# Patient Record
Sex: Female | Born: 1960 | Race: Black or African American | Hispanic: No | Marital: Married | Smoking: Current some day smoker
Health system: Southern US, Community
[De-identification: ages and names within clinical notes are randomized; demographics above are authoritative.]

## PROBLEM LIST (undated history)

## (undated) DIAGNOSIS — B182 Chronic viral hepatitis C: Secondary | ICD-10-CM

## (undated) DIAGNOSIS — K760 Fatty (change of) liver, not elsewhere classified: Secondary | ICD-10-CM

## (undated) DIAGNOSIS — T7840XA Allergy, unspecified, initial encounter: Secondary | ICD-10-CM

## (undated) DIAGNOSIS — M1990 Unspecified osteoarthritis, unspecified site: Secondary | ICD-10-CM

## (undated) DIAGNOSIS — K219 Gastro-esophageal reflux disease without esophagitis: Secondary | ICD-10-CM

## (undated) DIAGNOSIS — E785 Hyperlipidemia, unspecified: Secondary | ICD-10-CM

## (undated) DIAGNOSIS — I10 Essential (primary) hypertension: Secondary | ICD-10-CM

## (undated) DIAGNOSIS — R600 Localized edema: Secondary | ICD-10-CM

## (undated) DIAGNOSIS — M25519 Pain in unspecified shoulder: Secondary | ICD-10-CM

## (undated) DIAGNOSIS — E739 Lactose intolerance, unspecified: Secondary | ICD-10-CM

## (undated) DIAGNOSIS — M79606 Pain in leg, unspecified: Secondary | ICD-10-CM

## (undated) DIAGNOSIS — M549 Dorsalgia, unspecified: Secondary | ICD-10-CM

## (undated) HISTORY — DX: Gastro-esophageal reflux disease without esophagitis: K21.9

## (undated) HISTORY — DX: Fatty (change of) liver, not elsewhere classified: K76.0

## (undated) HISTORY — PX: TUBAL LIGATION: SHX77

## (undated) HISTORY — DX: Pain in leg, unspecified: M79.606

## (undated) HISTORY — DX: Hyperlipidemia, unspecified: E78.5

## (undated) HISTORY — DX: Chronic viral hepatitis C: B18.2

## (undated) HISTORY — PX: ABDOMINAL HYSTERECTOMY: SHX81

## (undated) HISTORY — DX: Pain in unspecified shoulder: M25.519

## (undated) HISTORY — DX: Allergy, unspecified, initial encounter: T78.40XA

## (undated) HISTORY — DX: Unspecified osteoarthritis, unspecified site: M19.90

## (undated) HISTORY — PX: ANKLE FRACTURE SURGERY: SHX122

## (undated) HISTORY — DX: Lactose intolerance, unspecified: E73.9

## (undated) HISTORY — DX: Localized edema: R60.0

## (undated) HISTORY — DX: Dorsalgia, unspecified: M54.9

## (undated) HISTORY — PX: LEG SURGERY: SHX1003

---

## 1999-01-30 ENCOUNTER — Other Ambulatory Visit: Admission: RE | Admit: 1999-01-30 | Discharge: 1999-01-30 | Payer: Self-pay | Admitting: Obstetrics and Gynecology

## 2000-06-20 ENCOUNTER — Other Ambulatory Visit: Admission: RE | Admit: 2000-06-20 | Discharge: 2000-06-20 | Payer: Self-pay | Admitting: *Deleted

## 2000-08-07 ENCOUNTER — Encounter (INDEPENDENT_AMBULATORY_CARE_PROVIDER_SITE_OTHER): Payer: Self-pay | Admitting: Specialist

## 2000-08-07 ENCOUNTER — Observation Stay (HOSPITAL_COMMUNITY): Admission: RE | Admit: 2000-08-07 | Discharge: 2000-08-08 | Payer: Self-pay | Admitting: *Deleted

## 2000-08-16 ENCOUNTER — Inpatient Hospital Stay (HOSPITAL_COMMUNITY): Admission: AD | Admit: 2000-08-16 | Discharge: 2000-08-16 | Payer: Self-pay | Admitting: Gynecology

## 2000-12-23 ENCOUNTER — Emergency Department (HOSPITAL_COMMUNITY): Admission: EM | Admit: 2000-12-23 | Discharge: 2000-12-23 | Payer: Self-pay | Admitting: Emergency Medicine

## 2002-05-19 ENCOUNTER — Encounter: Payer: Self-pay | Admitting: Emergency Medicine

## 2002-05-19 ENCOUNTER — Emergency Department (HOSPITAL_COMMUNITY): Admission: EM | Admit: 2002-05-19 | Discharge: 2002-05-19 | Payer: Self-pay | Admitting: Emergency Medicine

## 2003-06-05 HISTORY — PX: ABDOMINAL HYSTERECTOMY: SHX81

## 2003-11-15 ENCOUNTER — Emergency Department (HOSPITAL_COMMUNITY): Admission: EM | Admit: 2003-11-15 | Discharge: 2003-11-15 | Payer: Self-pay | Admitting: Emergency Medicine

## 2004-08-25 ENCOUNTER — Ambulatory Visit: Payer: Self-pay | Admitting: Internal Medicine

## 2005-09-18 ENCOUNTER — Emergency Department (HOSPITAL_COMMUNITY): Admission: EM | Admit: 2005-09-18 | Discharge: 2005-09-18 | Payer: Self-pay | Admitting: Emergency Medicine

## 2006-01-02 ENCOUNTER — Emergency Department (HOSPITAL_COMMUNITY): Admission: EM | Admit: 2006-01-02 | Discharge: 2006-01-02 | Payer: Self-pay | Admitting: Emergency Medicine

## 2006-01-16 ENCOUNTER — Ambulatory Visit (HOSPITAL_COMMUNITY): Admission: RE | Admit: 2006-01-16 | Discharge: 2006-01-16 | Payer: Self-pay | Admitting: Sports Medicine

## 2006-03-06 ENCOUNTER — Ambulatory Visit (HOSPITAL_BASED_OUTPATIENT_CLINIC_OR_DEPARTMENT_OTHER): Admission: RE | Admit: 2006-03-06 | Discharge: 2006-03-06 | Payer: Self-pay | Admitting: Orthopedic Surgery

## 2006-06-04 HISTORY — PX: KNEE ARTHROSCOPY: SHX127

## 2006-06-04 HISTORY — PX: OTHER SURGICAL HISTORY: SHX169

## 2007-04-15 ENCOUNTER — Emergency Department (HOSPITAL_COMMUNITY): Admission: EM | Admit: 2007-04-15 | Discharge: 2007-04-15 | Payer: Self-pay | Admitting: *Deleted

## 2008-02-06 ENCOUNTER — Emergency Department (HOSPITAL_COMMUNITY): Admission: EM | Admit: 2008-02-06 | Discharge: 2008-02-07 | Payer: Self-pay | Admitting: Emergency Medicine

## 2008-02-09 ENCOUNTER — Emergency Department (HOSPITAL_COMMUNITY): Admission: EM | Admit: 2008-02-09 | Discharge: 2008-02-09 | Payer: Self-pay | Admitting: Emergency Medicine

## 2008-08-27 ENCOUNTER — Emergency Department (HOSPITAL_COMMUNITY): Admission: EM | Admit: 2008-08-27 | Discharge: 2008-08-28 | Payer: Self-pay | Admitting: Emergency Medicine

## 2008-12-08 ENCOUNTER — Ambulatory Visit: Payer: Self-pay | Admitting: Infectious Diseases

## 2008-12-08 ENCOUNTER — Encounter: Payer: Self-pay | Admitting: Internal Medicine

## 2008-12-08 DIAGNOSIS — M549 Dorsalgia, unspecified: Secondary | ICD-10-CM | POA: Insufficient documentation

## 2008-12-08 DIAGNOSIS — E669 Obesity, unspecified: Secondary | ICD-10-CM | POA: Insufficient documentation

## 2008-12-08 DIAGNOSIS — G43709 Chronic migraine without aura, not intractable, without status migrainosus: Secondary | ICD-10-CM | POA: Insufficient documentation

## 2008-12-09 LAB — CONVERTED CEMR LAB
ALT: 45 units/L — ABNORMAL HIGH (ref 0–35)
Albumin: 3.7 g/dL (ref 3.5–5.2)
Alkaline Phosphatase: 74 units/L (ref 39–117)
Basophils Absolute: 0 10*3/uL (ref 0.0–0.1)
Basophils Relative: 0 % (ref 0–1)
Cholesterol: 190 mg/dL (ref 0–200)
Eosinophils Absolute: 0.1 10*3/uL (ref 0.0–0.7)
Free T4: 1.06 ng/dL (ref 0.80–1.80)
Glucose, Bld: 80 mg/dL (ref 70–99)
MCHC: 33.3 g/dL (ref 30.0–36.0)
MCV: 91 fL (ref 78.0–100.0)
Monocytes Relative: 8 % (ref 3–12)
Neutro Abs: 3.4 10*3/uL (ref 1.7–7.7)
Neutrophils Relative %: 45 % (ref 43–77)
Platelets: 255 10*3/uL (ref 150–400)
Potassium: 4.2 meq/L (ref 3.5–5.3)
RDW: 14.1 % (ref 11.5–15.5)
Sodium: 139 meq/L (ref 135–145)
TSH: 0.537 microintl units/mL (ref 0.350–4.500)
Total Bilirubin: 0.6 mg/dL (ref 0.3–1.2)
Total CHOL/HDL Ratio: 3
Total Protein: 7.1 g/dL (ref 6.0–8.3)

## 2009-04-09 ENCOUNTER — Encounter: Payer: Self-pay | Admitting: Family Medicine

## 2009-04-09 ENCOUNTER — Ambulatory Visit: Payer: Self-pay | Admitting: Infectious Diseases

## 2009-04-09 ENCOUNTER — Inpatient Hospital Stay (HOSPITAL_COMMUNITY): Admission: EM | Admit: 2009-04-09 | Discharge: 2009-04-11 | Payer: Self-pay | Admitting: Emergency Medicine

## 2009-04-11 ENCOUNTER — Encounter: Payer: Self-pay | Admitting: Internal Medicine

## 2009-04-11 ENCOUNTER — Encounter (INDEPENDENT_AMBULATORY_CARE_PROVIDER_SITE_OTHER): Payer: Self-pay | Admitting: Family Medicine

## 2009-04-21 ENCOUNTER — Ambulatory Visit: Payer: Self-pay | Admitting: Internal Medicine

## 2009-04-21 DIAGNOSIS — R079 Chest pain, unspecified: Secondary | ICD-10-CM | POA: Insufficient documentation

## 2009-04-21 DIAGNOSIS — R030 Elevated blood-pressure reading, without diagnosis of hypertension: Secondary | ICD-10-CM | POA: Insufficient documentation

## 2009-06-04 HISTORY — PX: ANKLE FRACTURE SURGERY: SHX122

## 2009-06-04 HISTORY — PX: LEG SURGERY: SHX1003

## 2009-11-02 ENCOUNTER — Ambulatory Visit: Payer: Self-pay | Admitting: Internal Medicine

## 2009-11-03 LAB — CONVERTED CEMR LAB
ALT: 49 units/L — ABNORMAL HIGH (ref 0–35)
AST: 39 units/L — ABNORMAL HIGH (ref 0–37)
Calcium: 9.6 mg/dL (ref 8.4–10.5)
Chloride: 104 meq/L (ref 96–112)
Creatinine, Ser: 0.81 mg/dL (ref 0.40–1.20)
Sodium: 138 meq/L (ref 135–145)
Total Protein: 6.7 g/dL (ref 6.0–8.3)

## 2010-02-03 ENCOUNTER — Emergency Department (HOSPITAL_COMMUNITY): Admission: EM | Admit: 2010-02-03 | Discharge: 2010-02-03 | Payer: Self-pay | Admitting: Emergency Medicine

## 2010-06-25 ENCOUNTER — Encounter: Payer: Self-pay | Admitting: Sports Medicine

## 2010-07-04 NOTE — Assessment & Plan Note (Signed)
Summary: headache/gg   Vital Signs:  Patient profile:   50 year old female Height:      59 inches (149.86 cm) Weight:      222.2 pounds (101.00 kg) BMI:     45.04 Temp:     97.4 degrees F (36.33 degrees C) oral Pulse rate:   70 / minute BP sitting:   123 / 82  (right arm)  Vitals Entered By: Stanton Kidney Ditzler RN (November 02, 2009 11:46 AM) Is Patient Diabetic? No Pain Assessment Patient in pain? yes     Location: back Intensity: 5 Type: aching Onset of pain  long time Nutritional Status BMI of > 30 = obese Nutritional Status Detail appetite good  Have you ever been in a relationship where you felt threatened, hurt or afraid?denies   Does patient need assistance? Functional Status Self care Ambulation Normal Comments Discuss back pain. Past 3-4 days h/a and feet and ankles swollen.   History of Present Illness: Sally Reid is a 50 yo lady with PMH as outlined in the EMR comes today for a f/u visit.   1. Back pain: IT is in the mid low back. It has remained stable and she used to take tramadol for pain.   2. HA: IT is on the forehead. SHe feels sleepy and feel loosy and is going on for 4 days. She had constant pain for the past 4 days. This feels like the same way as her migraine HA, but it is more intense. Her last migraine episode was at least 6 years ago. The pain is constant and throbbing. SHe also has photophobia. No nausea/vomiting/fever/chills. No visual aura. The HA is alleviated little bit by tylenol. It is aggravated by noise.   3. Leg swelling: She has ankle and feet swelling on both sides for always and she never mentioned this earlier. No calf pain.   Depression History:      The patient denies a depressed mood most of the day and a diminished interest in her usual daily activities.         Preventive Screening-Counseling & Management  Alcohol-Tobacco     Alcohol drinks/day: 0     Alcohol type: NONE     Smoking Status: current     Smoking Cessation Counseling:  yes     Packs/Day: 1 pp week     Year Started:   ABOUT THE AGE OF 19  Caffeine-Diet-Exercise     Does Patient Exercise: yes     Type of exercise: CARDIAC     Exercise (avg: min/session): 30-60     Times/week: 1-2  Current Medications (verified): 1)  Tramadol Hcl 50 Mg Tabs (Tramadol Hcl) .... Take1 Tablet Every 8 Hours As Needed For Pain.  Allergies: No Known Drug Allergies  Social History: Smoking Status:  current Packs/Day:  1 pp week  Review of Systems      See HPI  Physical Exam  Ears:  R ear normal and L ear normal.   Nose:  no sinus percussion tenderness.   Mouth:  pharynx pink and moist.   Lungs:  normal breath sounds, no crackles, and no wheezes.   Heart:  normal rate, regular rhythm, no murmur, no gallop, and no rub.   Extremities:  trace left pedal edema and trace right pedal edema.   Neurologic:  alert & oriented X3, cranial nerves II-XII intact, strength normal in all extremities, sensation intact to light touch, gait normal, finger-to-nose normal, and Romberg negative.   Cervical Nodes:  no anterior cervical adenopathy and no posterior cervical adenopathy.     Impression & Recommendations:  Problem # 1:  CHRONIC MIGRAINE W/O AURA W/O INTRACTABLE W/O SM (ICD-346.70) Her HA has some features of migraine, but her last attack was at least 6 years ago. Her HA is responding to tylenol. She is not taking ultram now. Plan is to continue ultram as needed and if it doesnot respond, will treat with more migraine specific drugs. But, she cannot afford them and an option would be cafergot.   Her updated medication list for this problem includes:    Tramadol Hcl 50 Mg Tabs (Tramadol hcl) .Marland Kitchen... Take1 tablet every 8 hours as needed for pain.  Problem # 2:  BACK PAIN (ICD-724.5) Stable. WIll cont same.  She c/o some leg swelling b/l, but there is none on exam and no calf tenderness. Last ECHO reviewed and LV function was normal.   Her updated medication list for this problem  includes:    Tramadol Hcl 50 Mg Tabs (Tramadol hcl) .Marland Kitchen... Take1 tablet every 8 hours as needed for pain.  Complete Medication List: 1)  Tramadol Hcl 50 Mg Tabs (Tramadol hcl) .... Take1 tablet every 8 hours as needed for pain.  Other Orders: T-Comprehensive Metabolic Panel ([id number])  Patient Instructions: 1)  Please schedule a follow-up appointment in 3 months. 2)  Limit your Sodium (Salt) to less than 2 grams a day(slightly less than 1/2 a teaspoon) to prevent fluid retention, swelling, or worsening of symptoms. 3)  It is important that you exercise regularly at least 20 minutes 5 times a week. If you develop chest pain, have severe difficulty breathing, or feel very tired , stop exercising immediately and seek medical attention. 4)  It is not healthy  for men to drink more than 2-3 drinks per day or for women to drink more than 1-2 drinks per day. Prescriptions: TRAMADOL HCL 50 MG TABS (TRAMADOL HCL) Take1 tablet every 8 hours as needed for pain.  #30 x 0   Entered and Authorized by:   Jason Coop MD   Signed by:   Jason Coop MD on 11/02/2009   Method used:   Electronically to        [facility] [phone]* (retail)       [address]       [place], [place]  [address]       Ph: [phone]       Fax: [phone]   RxID:   [id number]  Process Orders Check Orders Results:     [facility]: ABN not required for this insurance Tests Sent for requisitioning (November 02, 2009 6:00 PM):     11/02/2009: [facility] -- T-Comprehensive Metabolic Panel [phone] (signed)    Prevention & Chronic Care Immunizations   Influenza vaccine: Not documented   Influenza vaccine deferral: Deferred  (12/08/2008)   Influenza vaccine due: 02/02/2009    Tetanus booster: Not documented   Td booster deferral: Deferred  (12/08/2008)   Tetanus booster due: 12/09/2018    Pneumococcal vaccine: Not documented   Pneumococcal vaccine deferral:  Deferred  (12/08/2008)  Other Screening   Pap smear: Not documented   Pap smear action/deferral: Deferred  (11/02/2009)    Mammogram: Not documented   Mammogram action/deferral: Deferred  (11/02/2009)   Smoking status: current  (11/02/2009)   Smoking cessation counseling: yes  (11/02/2009)  Lipids   Total Cholesterol: 190  (12/08/2008)   Lipid panel action/deferral: Lipid Panel ordered  LDL: 95  (12/08/2008)   LDL Direct: Not documented   HDL: 63  (12/08/2008)   Triglycerides: 159  (12/08/2008)      Resource handout printed.  Process Orders Check Orders Results:     [facility]: ABN not required for this insurance Tests Sent for requisitioning (November 02, 2009 6:00 PM):     11/02/2009: [facility] -- T-Comprehensive Metabolic Panel [phone] (signed)

## 2010-08-09 ENCOUNTER — Encounter: Payer: Self-pay | Admitting: Internal Medicine

## 2010-09-02 ENCOUNTER — Emergency Department (HOSPITAL_COMMUNITY)
Admission: EM | Admit: 2010-09-02 | Discharge: 2010-09-02 | Disposition: A | Discharge status: Home / Self Care | Payer: Self-pay | Attending: Emergency Medicine | Admitting: Emergency Medicine

## 2010-09-02 ENCOUNTER — Emergency Department (HOSPITAL_COMMUNITY): Payer: Self-pay

## 2010-09-02 DIAGNOSIS — R059 Cough, unspecified: Secondary | ICD-10-CM | POA: Insufficient documentation

## 2010-09-02 DIAGNOSIS — R079 Chest pain, unspecified: Secondary | ICD-10-CM | POA: Insufficient documentation

## 2010-09-02 DIAGNOSIS — R05 Cough: Secondary | ICD-10-CM | POA: Insufficient documentation

## 2010-09-02 DIAGNOSIS — F17200 Nicotine dependence, unspecified, uncomplicated: Secondary | ICD-10-CM | POA: Insufficient documentation

## 2010-09-02 DIAGNOSIS — J40 Bronchitis, not specified as acute or chronic: Secondary | ICD-10-CM | POA: Insufficient documentation

## 2010-09-02 LAB — BASIC METABOLIC PANEL
BUN: 9 mg/dL (ref 6–23)
Calcium: 9.1 mg/dL (ref 8.4–10.5)
GFR calc non Af Amer: >60 mL/min (ref >60)
Glucose, Bld: 75 mg/dL (ref 70–99)
Sodium: 136 mEq/L (ref 135–145)

## 2010-09-02 LAB — CBC
HCT: 45.2 % (ref 36.0–46.0)
MCHC: 33.4 g/dL (ref 30.0–36.0)
MCV: 89.5 fL (ref 78.0–100.0)
Platelets: 192 10*3/uL (ref 150–400)
RDW: 13.2 % (ref 11.5–15.5)

## 2010-09-06 LAB — BASIC METABOLIC PANEL
BUN: 12 mg/dL (ref 6–23)
BUN: 5 mg/dL — ABNORMAL LOW (ref 6–23)
Calcium: 9 mg/dL (ref 8.4–10.5)
Creatinine, Ser: 0.69 mg/dL (ref 0.4–1.2)
GFR calc non Af Amer: 53 mL/min — ABNORMAL LOW (ref >60)
GFR calc non Af Amer: >60 mL/min (ref >60)
GFR calc non Af Amer: >60 mL/min (ref >60)
Glucose, Bld: 93 mg/dL (ref 70–99)
Glucose, Bld: 94 mg/dL (ref 70–99)
Potassium: 3.7 mEq/L (ref 3.5–5.1)
Sodium: 136 mEq/L (ref 135–145)

## 2010-09-06 LAB — DIFFERENTIAL
Basophils Relative: 1 % (ref 0–1)
Eosinophils Absolute: 0.1 10*3/uL (ref 0.0–0.7)
Lymphocytes Relative: 41 % (ref 12–46)
Lymphocytes Relative: 60 % — ABNORMAL HIGH (ref 12–46)
Lymphs Abs: 3.1 10*3/uL (ref 0.7–4.0)
Lymphs Abs: 3.6 10*3/uL (ref 0.7–4.0)
Monocytes Absolute: 0.4 10*3/uL (ref 0.1–1.0)
Monocytes Relative: 9 % (ref 3–12)
Neutro Abs: 1.5 10*3/uL — ABNORMAL LOW (ref 1.7–7.7)
Neutro Abs: 4.3 10*3/uL (ref 1.7–7.7)
Neutrophils Relative %: 29 % — ABNORMAL LOW (ref 43–77)
Neutrophils Relative %: 49 % (ref 43–77)

## 2010-09-06 LAB — CBC
HCT: 39 % (ref 36.0–46.0)
Hemoglobin: 13.2 g/dL (ref 12.0–15.0)
MCHC: 33.9 g/dL (ref 30.0–36.0)
MCHC: 34.4 g/dL (ref 30.0–36.0)
MCV: 94.3 fL (ref 78.0–100.0)
Platelets: 189 10*3/uL (ref 150–400)
Platelets: 229 10*3/uL (ref 150–400)
RDW: 13.8 % (ref 11.5–15.5)
RDW: 13.9 % (ref 11.5–15.5)
WBC: 8.8 10*3/uL (ref 4.0–10.5)

## 2010-09-06 LAB — CARDIAC PANEL(CRET KIN+CKTOT+MB+TROPI)
CK, MB: 0.9 ng/mL (ref 0.3–4.0)
CK, MB: 1.1 ng/mL (ref 0.3–4.0)
Relative Index: INVALID (ref 0.0–2.5)
Total CK: 69 U/L (ref 7–177)
Total CK: 82 U/L (ref 7–177)
Troponin I: 0.01 ng/mL (ref 0.00–0.06)
Troponin I: 0.01 ng/mL (ref 0.00–0.06)

## 2010-09-06 LAB — URINALYSIS, ROUTINE W REFLEX MICROSCOPIC
Glucose, UA: NEGATIVE mg/dL
Ketones, ur: NEGATIVE mg/dL
Protein, ur: NEGATIVE mg/dL

## 2010-09-06 LAB — POCT CARDIAC MARKERS
CKMB, poc: 1.2 ng/mL (ref 1.0–8.0)
Myoglobin, poc: 84.6 ng/mL (ref 12–200)
Troponin i, poc: <0.05 ng/mL (ref 0.00–0.09)
Troponin i, poc: <0.05 ng/mL (ref 0.00–0.09)

## 2010-09-06 LAB — MAGNESIUM: Magnesium: 1.9 mg/dL (ref 1.5–2.5)

## 2010-09-06 LAB — HEMOGLOBIN A1C: Mean Plasma Glucose: 103 mg/dL

## 2010-09-06 LAB — TSH: TSH: 0.934 u[IU]/mL (ref 0.350–4.500)

## 2010-09-06 LAB — LIPID PANEL: Triglycerides: 90 mg/dL (ref <150)

## 2010-10-20 NOTE — H&P (Signed)
[facility]  Patient:    Sally Reid, Sally Reid                     MRN: [id number] Adm. Date:  08/02/00 Attending:  Katy Fitch, M.D.                         History and Physical  CHIEF COMPLAINT:              Menorrhagia and dysmenorrhea.  HISTORY OF PRESENT ILLNESS:   The patient is a 50 year old, G3, P2, A1, who has had a long-standing history of painful periods along with heavy vaginal bleeding when she has her cycles. The patient is known to have cycles between 28 and 50 days and had normal studies of thyroid and prolactin performed. The patient also has heavy bleeding when she does have her periods and also increased pelvic pain with her periods. The patient had an ultrasound in the office showing a 9 x 8 x 5 cm uterus with a large fibroid in the left-hand side extending to the adnexa. The patient denies any other complaints.  PAST MEDICAL HISTORY:         None.  PAST SURGICAL HISTORY:        Two prior C-sections and a D&C for a missed abortion.  PAST OBSTETRICAL HISTORY:     Two C-sections, 1981 and 1983, and a D&C for a missed abortion.  PAST GYNECOLOGICAL HISTORY:   Menarche age 9. No history of abnormal Pap smears or STDs.  MEDICATIONS:                  None.  ALLERGIES:                    None.  SOCIAL HISTORY:               The patient works in a nursing home. She smokes a pack a day. She denies any alcohol or drugs.  FAMILY HISTORY:               Without any epithelial cancers.  PHYSICAL EXAMINATION:  VITAL SIGNS:                  Blood pressure 106/74. Weight 210.  HEENT:                        Clear.  NECK:                         Without any thyromegaly or JVD.  LUNGS:                        Clear to auscultation bilaterally.  HEART:                        Regular rate and rhythm without any murmurs, rubs, or gallops.  ABDOMEN:                      Soft, nontender, no palpable masses.  EXTREMITIES:                   Without any edema, clubbing, cyanosis, or _______.  PELVIC:                       Normal external genitalia. Cervix  without any gross lesions. Uterus 8- to 10-week size. Good uterine descensus when tenaculum placed on cervix. Adnexa without any masses, nontender.  ASSESSMENT/PLAN:              A 50 year old, G3, P2, A1, with menorrhagia and dysmenorrhea who is desiring definitive therapy. Will schedule a total vaginal hysterectomy with possible laparotomy. The patient was explained risks and benefits of the procedure including bleeding, transfusion, infection, scar tissue, wound breakdown, damage to internal organs, fistula formation, continued pain, loss of reproductive capabilities, anesthetic complications, blood clots, pulmonary embolism, or possibly death. The patient was also extensively counseled that she may need to have a conversion to a laparotomy due to the fibroid and difficulty of getting into the cul-de-sac due to her previous cesarean sections. However, the patient did have good uterine descensus on exam and was explained that we will initially attempt a vaginal hysterectomy with the possibility of needing conversion. The patient states she understands this risk, along with her husband, and will desire to proceed. The patient will have anesthesia workup on March 5 and have her surgery on March 6. DD:  08/02/00 TD:  08/03/00 Job: [id number]

## 2010-10-20 NOTE — Op Note (Signed)
[facility]  Patient:    Sally Reid, Sally Reid                   MRN: [id number] Proc. Date: 08/07/00 Adm. Date:  [id number] Attending:  Wetzel Bjornstad                           Operative Report  PREOPERATIVE DIAGNOSES:       1. Ten week fibroid uterus.                               2. Menorrhagia.                               3. Dysmenorrhea.  POSTOPERATIVE DIAGNOSES:      1. Ten week fibroid uterus.                               2. Menorrhagia.                               3. Dysmenorrhea.  OPERATION:                    Total vaginal hysterectomy.  SURGEON:                      Katy Fitch, M.D.  ASSISTANT:                    Rande Brunt. Eda Paschal, M.D.  ANESTHESIA:                   General.  ESTIMATED BLOOD LOSS:         100 cc.  URINE OUTPUT:                 100 cc clear.  INTRAVENOUS FLUIDS:           2000 cc Crystalloid.  SPECIMENS:                    Uterus.  INTRAOPERATIVE FINDINGS:      Ten to twelve weeks sized fibroid uterus. Normal appearing tubes and ovaries.  COMPLICATIONS:                None.  DESCRIPTION OF PROCEDURE:     After consents and procedure explained, the patient was taken to the operating room and placed in stirrups. The patient was given general anesthesia and then prepped and draped in a normal sterile fashion. A weighted speculum was placed on the vagina and then a Foley catheter was inserted in the bladder and initial 50 cc of clear urine was noted. The anterior lip of the cervix was then grasped with a single tooth tenaculum and Pitressin solution, approximately 20 cc was circumferentially injected into the cervicovaginal mucosa. The mucosa was then incised with a scalpel down to the pubovesicocervical fascia and this was then bluntly dissected off using a sponge on a finger. The posterior peritoneum was then identified and the posterior cul-de-sac was entered sharply using curved Mayo scissors. A  long weighted speculum was then placed into the cul-de-sac. The anterior mucosa was then dissected off without difficulty and a Heaney clamp was then placed  on the uterosacral ligament on the left, clamped, cut and ligated using #0 Vicryl. This suture was held and this was done similarly on the right. Then another bite using Heaney was performed and suture ligated with #0 chromic. The patient was noted to have a long cervix and two more bites were needed no both sides to get to the uterine vessels. Once at the uterine vessels being clamped with Heaney and suture ligated with #0 Vicryl. At this point the cervix was then cored using a scalpel for purposes of exposure, the uterus was not able to be delivered secondary to large intramural fibroids which were both fundal and to the left side. At this point a myomectomy was performed in the midline and two 3 x 3 cm fibroids were delivered. A larger 5 x 5 cm fibroid was then delivered on the left hand side without difficulty. At this point the broad ligaments were able to be clamped using a Heaney clamp and suture ligated with #0 Vicryl. The utero-ovarian ligaments were then cross clamped and the specimen was then removed. Both of these were doubly ligated with #0 Vicryl without difficulty. The peritoneum was identified and noted to have some mild bleeding. The posterior vaginal cuff was then whipstitched with a #0 chromic in a running interlocked stitch. A sponge was then placed into the posterior cul-de-sac and no bleeding was noted from the pedicles. Irrigation was then placed and no bleeding was noted at the anterior vaginal cuff. At this point the sponge was removed and the angled sutures were placed at the vaginal cuff using #0 Vicryl and then four interrupted vertical mattresses sutures of #0 Vicryl were placed in the cuff and hemostasis was noted. A vaginal pack was placed and the patient was taken to the recovery room in stable  condition. DD:  08/07/00 TD:  08/07/00 Job: [id number]

## 2010-10-20 NOTE — Discharge Summary (Signed)
[facility]  Patient:    Sally Reid, Sally Reid                   MRN: [id number] Adm. Date:  [id number] Disc. Date: [id number] Attending:  Merrily Pew Dictator:   Antony Contras, N.P.                           Discharge Summary  DISCHARGE DIAGNOSES:          A 10 weeks sized fibroid uterus, menorrhagia, dysmenorrhea.  PROCEDURE:                    Total vaginal hysterectomy.  HISTORY OF PRESENT ILLNESS:   Patient is a 50 year old gravida 3, para 2, AB1 who has had a long-standing history of painful periods with heavy vaginal bleeding with her cycles.  She is known to have cycles between 28-50 days and has normal ______ thyroid and prolactin.  She had also had an ultrasound which shows a 9 x 8 x 5 cm uterus with a large fibroid in the left-hand side extending to the adnexa.  She is desiring definitive surgery and was scheduled for total vaginal hysterectomy with possible laparotomy.  HOSPITAL COURSE:              Patient was admitted on August 07, 2000.  Total vaginal hysterectomy was performed by Dr. Penni Homans assisted by Dr. Eda Paschal under general anesthesia.  The findings were a 10-12 weeks sized fibroid uterus, normal appearing tubes and ovaries.  Postoperatively patient remained afebrile.  Had no difficulty voiding and was able to be discharged in satisfactory condition on her first postoperative day.  LABORATORIES:                 CBC:  Hematocrit 31.7, hemoglobin 11, WBC 11.5, platelets 278,000.  DISPOSITION:                  Follow-up in the office.  Appropriate postoperative pain management. DD:  08/26/00 TD:  08/27/00 Job: [id number]

## 2010-10-20 NOTE — Op Note (Signed)
Sally Reid, Sally Reid     ACCOUNT NO.:  [id number]   MEDICAL RECORD NO.:  [id number]          PATIENT TYPE:  AMB   LOCATION:  DSC                          FACILITY:  [facility]   PHYSICIAN:  Loreta Ave, M.D. DATE OF BIRTH:  [birth year]   DATE OF PROCEDURE:  03/06/2006  DATE OF DISCHARGE:                                 OPERATIVE REPORT   PREOPERATIVE DIAGNOSES:  Lateral meniscal tear, left knee, with a small cyst  behind posterior cruciate ligament.   POSTOPERATIVE DIAGNOSES:  Lateral meniscal tear, left knee, with a small  cyst behind posterior cruciate ligament.   PROCEDURES:  Left knee examination under anesthesia, arthroscopy, extensive  partial lateral meniscectomy, excision of cyst behind posterior cruciate  ligament.   SURGEON:  Loreta Ave, M.D.   ASSISTANT:  Genene Churn. Denton Meek.   ANESTHESIA:  General.   BLOOD LOSS:  Minimal.   TOURNIQUET:  Not employed.   SPECIMENS:  None.   CULTURES:  None.   COMPLICATIONS:  None.   DRESSING:  Sterile compressive.   DESCRIPTION OF PROCEDURES:  The patient was brought to the operating room,  and after adequate anesthesia had been obtained, the left knee was examined.  Full motion and good stability.  Somewhat limited by adiposity.  Tourniquet  leg holder applied.  Leg prepped and draped in the usual sterile fashion.  Three portals were created, 1 superolateral, 1 each, medial and lateral  parapatellar.  Inflow cannula was introduced.  The knee was distended.  The  arthroscope was introduced.  The knee was inspected.  Good patellofemoral  tracking and cartilage.  Cruciate ligaments intact.  A small area of  synovitis and a small cyst behind the posterior cruciate ligament seen  posteromedially, excised.  Medial meniscus, medial compartment intact.  Lateral compartment looked good, but there was marked complex tearing of the  lateral meniscus and numerous displaced fragments, some of which displaced  up into the  front of the compartment, consistent with an old bucket-handle  tear of the torn half, irreparable.  The entire posterior half of the  meniscus removed, tapered to remaining meniscus.  At completion, the entire  knee  examined; no other findings appreciated.  Instruments and fluid removed.  Portals and knee injected with Marcaine.  Portals were closed with 4-0  nylon.  Sterile compressive dressing applied.  Anesthesia reversed.  Brought  to recovery room.  Tolerated surgery well.  No complications.      Loreta Ave, M.D.  Electronically Signed     DFM/MEDQ  D:  03/06/2006  T:  03/06/2006  Job:  [id number]

## 2010-12-10 ENCOUNTER — Emergency Department (HOSPITAL_COMMUNITY): Payer: Self-pay

## 2010-12-10 ENCOUNTER — Emergency Department (HOSPITAL_COMMUNITY)
Admission: EM | Admit: 2010-12-10 | Discharge: 2010-12-10 | Disposition: A | Discharge status: Home / Self Care | Payer: Self-pay | Attending: Emergency Medicine | Admitting: Emergency Medicine

## 2010-12-10 DIAGNOSIS — R071 Chest pain on breathing: Secondary | ICD-10-CM | POA: Insufficient documentation

## 2010-12-10 DIAGNOSIS — R7989 Other specified abnormal findings of blood chemistry: Secondary | ICD-10-CM | POA: Insufficient documentation

## 2010-12-10 DIAGNOSIS — R0609 Other forms of dyspnea: Secondary | ICD-10-CM | POA: Insufficient documentation

## 2010-12-10 DIAGNOSIS — R1013 Epigastric pain: Secondary | ICD-10-CM | POA: Insufficient documentation

## 2010-12-10 DIAGNOSIS — R0989 Other specified symptoms and signs involving the circulatory and respiratory systems: Secondary | ICD-10-CM | POA: Insufficient documentation

## 2010-12-10 LAB — DIFFERENTIAL
Basophils Absolute: 0 10*3/uL (ref 0.0–0.1)
Eosinophils Relative: 1 % (ref 0–5)
Lymphocytes Relative: 54 % — ABNORMAL HIGH (ref 12–46)
Lymphs Abs: 4.2 10*3/uL — ABNORMAL HIGH (ref 0.7–4.0)
Neutro Abs: 2.8 10*3/uL (ref 1.7–7.7)
Neutrophils Relative %: 35 % — ABNORMAL LOW (ref 43–77)

## 2010-12-10 LAB — COMPREHENSIVE METABOLIC PANEL
ALT: 57 U/L — ABNORMAL HIGH (ref 0–35)
Albumin: 2.9 g/dL — ABNORMAL LOW (ref 3.5–5.2)
Alkaline Phosphatase: 88 U/L (ref 39–117)
Chloride: 104 mEq/L (ref 96–112)
Glucose, Bld: 88 mg/dL (ref 70–99)
Potassium: 3.8 mEq/L (ref 3.5–5.1)
Sodium: 136 mEq/L (ref 135–145)
Total Bilirubin: 0.4 mg/dL (ref 0.3–1.2)
Total Protein: 6.9 g/dL (ref 6.0–8.3)

## 2010-12-10 LAB — CBC
HCT: 39.3 % (ref 36.0–46.0)
RBC: 4.54 MIL/uL (ref 3.87–5.11)
RDW: 13.1 % (ref 11.5–15.5)
WBC: 7.8 10*3/uL (ref 4.0–10.5)

## 2010-12-10 LAB — TROPONIN I: Troponin I: <0.3 ng/mL (ref <0.30)

## 2011-03-07 LAB — URINALYSIS, ROUTINE W REFLEX MICROSCOPIC
Nitrite: NEGATIVE
Nitrite: NEGATIVE
Specific Gravity, Urine: 1.022
Specific Gravity, Urine: 1.022
Urobilinogen, UA: 1
pH: 5.5

## 2011-03-13 LAB — DIFFERENTIAL
Basophils Relative: 0
Monocytes Absolute: 0.4
Monocytes Relative: 7
Neutro Abs: 3.1

## 2011-03-13 LAB — RAPID URINE DRUG SCREEN, HOSP PERFORMED
Amphetamines: NOT DETECTED
Barbiturates: NOT DETECTED

## 2011-03-13 LAB — CBC
Hemoglobin: 14.1
MCHC: 33.7
RBC: 4.45

## 2011-03-13 LAB — URINALYSIS, ROUTINE W REFLEX MICROSCOPIC
Hgb urine dipstick: NEGATIVE
Protein, ur: NEGATIVE
Urobilinogen, UA: 1

## 2011-03-13 LAB — I-STAT 8, (EC8 V) (CONVERTED LAB)
Acid-base deficit: 1
Glucose, Bld: 89
HCT: 43
Hemoglobin: 14.6
Potassium: 4
Sodium: 139
TCO2: 24

## 2011-03-13 LAB — POCT CARDIAC MARKERS
CKMB, poc: <1 — ABNORMAL LOW
Troponin i, poc: <0.05

## 2011-08-01 ENCOUNTER — Other Ambulatory Visit: Payer: Self-pay | Admitting: Obstetrics and Gynecology

## 2011-08-01 DIAGNOSIS — Z1231 Encounter for screening mammogram for malignant neoplasm of breast: Secondary | ICD-10-CM

## 2011-08-07 ENCOUNTER — Other Ambulatory Visit: Payer: Self-pay | Admitting: Obstetrics and Gynecology

## 2011-08-07 ENCOUNTER — Ambulatory Visit (INDEPENDENT_AMBULATORY_CARE_PROVIDER_SITE_OTHER): Payer: Self-pay | Admitting: *Deleted

## 2011-08-07 ENCOUNTER — Ambulatory Visit (HOSPITAL_COMMUNITY): Payer: Self-pay | Attending: Obstetrics and Gynecology

## 2011-08-07 VITALS — BP 128/85 | HR 93 | Temp 97.6°F | Ht 59.0 in | Wt 223.6 lb

## 2011-08-07 DIAGNOSIS — N644 Mastodynia: Secondary | ICD-10-CM

## 2011-08-07 DIAGNOSIS — Z01419 Encounter for gynecological examination (general) (routine) without abnormal findings: Secondary | ICD-10-CM

## 2011-08-07 DIAGNOSIS — N898 Other specified noninflammatory disorders of vagina: Secondary | ICD-10-CM

## 2011-08-07 DIAGNOSIS — N63 Unspecified lump in unspecified breast: Secondary | ICD-10-CM

## 2011-08-07 DIAGNOSIS — N6310 Unspecified lump in the right breast, unspecified quadrant: Secondary | ICD-10-CM

## 2011-08-07 DIAGNOSIS — N6312 Unspecified lump in the right breast, upper inner quadrant: Secondary | ICD-10-CM

## 2011-08-07 NOTE — Patient Instructions (Addendum)
Taught patient how to perform BSE and gave educational materials to take home. Told patient that if today's Pap smear is normal she will not need any further Pap smears since has had a hysterectomy for benign reasons. Patient is scheduled for a diagnostic mammogram next Wednesday, August 15, 2011 at 0930. Patient aware of appointment and will be there. Let patient know will follow up with her within the next couple weeks with results. Patient verbalized understanding.

## 2011-08-07 NOTE — Progress Notes (Signed)
Complaints of left outer breast soreness rated a 8 out of 10 over the last 1 to 1 1/2 months.  Pap Smear:    Pap smear completed today. Per patient last Pap smear was in 2005. Per patient no history of abnormal Pap smears. Patient has a history of a hysterectomy for benign reasons. If  today's Pap smear is normal patient will not need any further Pap smears per BCCCP and ACOG guidelines. No Pap smear results in EPIC.  Physical exam: Breasts Breasts symmetrical. No skin abnormalities bilateral breasts. No nipple retraction bilateral breasts. No nipple discharge bilateral breasts. No lymphadenopathy. No lumps palpated left left. Palpated a small lump within the right breast at around 2 o'clock about 5 cm from the nipple. Complaints of tenderness on palpation of the left outer breast and lump in right breast.        Pelvic/Bimanual   Ext Genitalia No lesions, no swelling and no discharge observed on external genitalia.         Vagina Vagina pink and normal texture. No lesions observed in vagina. Thick white discharge observed in vagina. Wet prep completed today.         Cervix Cervix is absent due a history of a partial hysterectomy.    Uterus Uterus is absent due a history of a partial hysterectomy.    Adnexae Bilateral ovaries present and palpable. No tenderness on palpation.          Rectovaginal No rectal exam completed today since patient had no rectal complaints. No skin abnormalities observed on exam.

## 2011-08-07 NOTE — Progress Notes (Signed)
Pain, soreness, and itching in left breast about a month.

## 2011-08-08 MED ORDER — METRONIDAZOLE 500 MG PO TABS: 500.0000 mg | ORAL_TABLET | Freq: Two times a day (BID) | ORAL | Status: AC

## 2011-08-08 NOTE — Progress Notes (Signed)
Addended by: Catalina Antigua on: 08/08/2011 07:59 AM   Modules accepted: Orders

## 2011-08-09 ENCOUNTER — Telehealth: Payer: Self-pay | Admitting: *Deleted

## 2011-08-09 NOTE — Telephone Encounter (Signed)
Attempted to call patient to give wet prep results. Wet prep showed BV and wanted to let her know a prescription has been sent to her pharmacy. No one answered phone. Left voicemail for patient to call me back.  Patient called me back a few minutes later. Let her know the wet prep completed showed BV. Told her a prescription for Flagyl has been sent to her pharmacy. Told patient to avoid alcohol while taking medication. Let her know have not received Pap smear result and if normal will send her a letter or if abnormal will call her. [facility] for her diagnostic mammogram. Patient verbalized understanding.

## 2011-08-15 ENCOUNTER — Other Ambulatory Visit: Payer: Self-pay

## 2011-08-22 ENCOUNTER — Other Ambulatory Visit: Payer: Self-pay

## 2011-08-31 ENCOUNTER — Encounter: Payer: Self-pay | Admitting: Obstetrics and Gynecology

## 2011-08-31 ENCOUNTER — Ambulatory Visit
Admission: RE | Admit: 2011-08-31 | Discharge: 2011-08-31 | Disposition: A | Discharge status: Home / Self Care | Payer: No Typology Code available for payment source | Source: Ambulatory Visit | Attending: Obstetrics and Gynecology | Admitting: Obstetrics and Gynecology

## 2011-08-31 DIAGNOSIS — N644 Mastodynia: Secondary | ICD-10-CM

## 2011-08-31 DIAGNOSIS — N6312 Unspecified lump in the right breast, upper inner quadrant: Secondary | ICD-10-CM

## 2011-09-07 ENCOUNTER — Telehealth: Payer: Self-pay | Admitting: *Deleted

## 2011-09-07 NOTE — Telephone Encounter (Signed)
Telephoned patient at home # and left message to return call.  

## 2011-09-11 ENCOUNTER — Telehealth: Payer: Self-pay | Admitting: *Deleted

## 2011-09-11 NOTE — Telephone Encounter (Signed)
Telephoned pt and left message to return call to Fonnie Mu [phone].

## 2011-09-18 ENCOUNTER — Telehealth: Payer: Self-pay

## 2011-09-18 NOTE — Telephone Encounter (Signed)
Called pt and informed her of a reminder to schedule her annual screening mammogram for March 2014.  Pt stated understanding and had no further questions.

## 2012-01-02 ENCOUNTER — Emergency Department (HOSPITAL_COMMUNITY)
Admission: EM | Admit: 2012-01-02 | Discharge: 2012-01-02 | Disposition: A | Discharge status: Home / Self Care | Payer: Self-pay | Attending: Emergency Medicine | Admitting: Emergency Medicine

## 2012-01-02 ENCOUNTER — Encounter (HOSPITAL_COMMUNITY): Payer: Self-pay

## 2012-01-02 DIAGNOSIS — F17200 Nicotine dependence, unspecified, uncomplicated: Secondary | ICD-10-CM | POA: Insufficient documentation

## 2012-01-02 DIAGNOSIS — H04309 Unspecified dacryocystitis of unspecified lacrimal passage: Secondary | ICD-10-CM | POA: Insufficient documentation

## 2012-01-02 DIAGNOSIS — M129 Arthropathy, unspecified: Secondary | ICD-10-CM | POA: Insufficient documentation

## 2012-01-02 DIAGNOSIS — H04329 Acute dacryocystitis of unspecified lacrimal passage: Secondary | ICD-10-CM

## 2012-01-02 MED ORDER — AMOXICILLIN-POT CLAVULANATE 500-125 MG PO TABS: 1.0000 | ORAL_TABLET | Freq: Three times a day (TID) | ORAL | Status: AC

## 2012-01-02 MED ORDER — AMOXICILLIN-POT CLAVULANATE 250-125 MG PO TABS: 1.0000 | ORAL_TABLET | Freq: Three times a day (TID) | ORAL | Status: DC

## 2012-01-02 NOTE — ED Notes (Signed)
Patient has pain and swelling to left eye. Patient reports that she had a sty 2 weeks ago and it  went away and now has had swelling and blurred vision that has gotten progressively worse in the past 5 days.

## 2012-01-06 NOTE — ED Provider Notes (Signed)
History     CSN: [id number]  Arrival date & time 01/02/12  1217   First MD Initiated Contact with Patient 01/02/12 1351      Chief Complaint  Patient presents with  . Eye Pain  . Blurred Vision    (Consider location/radiation/quality/duration/timing/severity/associated sxs/prior treatment) Patient is a 51 y.o. female presenting with eye pain. The history is provided by the patient.  Eye Pain  51 y/o female iNAD c/o left eye discomfort and eye- lid swelling worsening over the last 5 days. Pt had a stye x2 weeks ago, now resolved. Pt reports a crusting and purulent discharge which blurs her vision. She denies eye pain proper, but states that her eye lid, especially on the medial side, is swollen and tender.  Denies pain with eye movement or fever.   Past Medical History  Diagnosis Date  . Arthritis     Past Surgical History  Procedure Date  . Abdominal hysterectomy     2005  . Arthroscopy of left knee 2008    for cartilage surgery  . Cesarean section     2 previous    Family History  Problem Relation Age of Onset  . Diabetes Mother   . Hypertension Mother     History  Substance Use Topics  . Smoking status: Passive Smoker -- 0.2 packs/day  . Smokeless tobacco: Never Used  . Alcohol Use: Yes     occassionally    OB History    Grav Para Term Preterm Abortions TAB SAB Ect Mult Living   3 2 2  1 1    2       Review of Systems  Eyes: Negative for pain.       Eye lid pain and swelling.     Allergies  Review of patient's allergies indicates no known allergies.  Home Medications   Current Outpatient Rx  Name Route Sig Dispense Refill  . ACETAMINOPHEN 500 MG PO TABS Oral Take 1,000 mg by mouth every 6 (six) hours as needed. For pain.    . ADULT MULTIVITAMIN W/MINERALS CH Oral Take 1 tablet by mouth daily.    . AMOXICILLIN-POT CLAVULANATE 500-125 MG PO TABS Oral Take 1 tablet (500 mg total) by mouth every 8 (eight) hours. 21 tablet 0    BP 128/85  Pulse 77   Temp 98.1 F (36.7 C) (Oral)  Resp 20  SpO2 98%  Physical Exam  Nursing note and vitals reviewed. Constitutional: She is oriented to person, place, and time. She appears well-developed and well-nourished. No distress.  HENT:  Head: Normocephalic.  Eyes: Conjunctivae and EOM are normal. Pupils are equal, round, and reactive to light.       Swelling and warmth to medial canthus of left eye, tense and tender to palpation. Mild conjunctival injection, with purulent drainage. No pain with EOM  Cardiovascular: Normal rate.   Pulmonary/Chest: Effort normal.  Musculoskeletal: Normal range of motion.  Neurological: She is alert and oriented to person, place, and time.  Psychiatric: She has a normal mood and affect.    ED Course  Procedures (including critical care time)  Labs Reviewed - No data to display No results found.   1. Dacryocystitis, acute       MDM  Swelling warmth and tenderness to left medial canthus consistent with dacryocystis.  No evidence of orbital cellulitis. I will treat with Augmentin 500 mg BID and encourage warm compresses. Strict return precautions given.  Pt verbalized understanding and agrees with care plan. Outpatient  follow-up and return precautions given.           Joni Reining Leida Luton, PA-C 01/06/12 0100

## 2012-01-08 NOTE — ED Provider Notes (Signed)
Medical screening examination/treatment/procedure(s) were performed by non-physician practitioner and as supervising physician I was immediately available for consultation/collaboration.  Flint Melter, MD 01/08/12 [phone]

## 2012-02-13 ENCOUNTER — Emergency Department (HOSPITAL_COMMUNITY)
Admission: EM | Admit: 2012-02-13 | Discharge: 2012-02-13 | Disposition: A | Discharge status: Home / Self Care | Payer: Self-pay | Attending: Emergency Medicine | Admitting: Emergency Medicine

## 2012-02-13 ENCOUNTER — Encounter (HOSPITAL_COMMUNITY): Payer: Self-pay

## 2012-02-13 DIAGNOSIS — Z87891 Personal history of nicotine dependence: Secondary | ICD-10-CM | POA: Insufficient documentation

## 2012-02-13 DIAGNOSIS — H60399 Other infective otitis externa, unspecified ear: Secondary | ICD-10-CM | POA: Insufficient documentation

## 2012-02-13 DIAGNOSIS — M129 Arthropathy, unspecified: Secondary | ICD-10-CM | POA: Insufficient documentation

## 2012-02-13 DIAGNOSIS — H6090 Unspecified otitis externa, unspecified ear: Secondary | ICD-10-CM

## 2012-02-13 MED ORDER — CIPROFLOXACIN-DEXAMETHASONE 0.3-0.1 % OT SUSP
4.0000 [drp] | Freq: Once | OTIC | Status: AC
Start: 2012-02-13 — End: 2012-02-13
  Administered 2012-02-13: 4 [drp] via OTIC
  Filled 2012-02-13: qty 7.5

## 2012-02-13 NOTE — ED Provider Notes (Signed)
History     CSN: [id number]  Arrival date & time 02/13/12  1557   First MD Initiated Contact with Patient 02/13/12 1642      Chief Complaint  Patient presents with  . Otalgia    (Consider location/radiation/quality/duration/timing/severity/associated sxs/prior treatment) HPI Comments: Patient presents with complaint of left ear drainage and pain. Patient noted light brown drainage from her left ear for the past 2 weeks. For the past week the patient has noted pain in her ear. She has not had any fever or pain around her ears or diabetes. No dental problems or cold symptoms. She denies fever. No nausea or vomiting. No history of similar symptoms in the past. Hearing is unchanged. No tinnitus. Onset was was gradual. Course is gradually worsening. Nothing makes symptoms better or worse.  Patient is a 51 y.o. female presenting with ear pain. The history is provided by the patient.  Otalgia This is a new problem. The current episode started more than 1 week ago. There is pain in the left ear. The problem occurs constantly. The problem has not changed since onset.There has been no fever. The pain is mild. Associated symptoms include ear discharge. Pertinent negatives include no headaches, no hearing loss, no rhinorrhea, no sore throat, no abdominal pain, no diarrhea, no vomiting, no cough and no rash.    Past Medical History  Diagnosis Date  . Arthritis     Past Surgical History  Procedure Date  . Abdominal hysterectomy     2005  . Arthroscopy of left knee 2008    for cartilage surgery  . Cesarean section     2 previous    Family History  Problem Relation Age of Onset  . Diabetes Mother   . Hypertension Mother     History  Substance Use Topics  . Smoking status: Former Smoker -- 0.2 packs/day  . Smokeless tobacco: Never Used  . Alcohol Use: Yes     occassionally    OB History    Grav Para Term Preterm Abortions TAB SAB Ect Mult Living   3 2 2  1 1    2       Review  of Systems  Constitutional: Negative for fever, chills and fatigue.  HENT: Positive for ear pain and ear discharge. Negative for hearing loss, congestion, sore throat, rhinorrhea, neck stiffness and sinus pressure.   Eyes: Negative for redness.  Respiratory: Negative for cough and wheezing.   Gastrointestinal: Negative for nausea, vomiting, abdominal pain and diarrhea.  Genitourinary: Negative for dysuria.  Musculoskeletal: Negative for myalgias.  Skin: Negative for rash.  Neurological: Negative for headaches.  Hematological: Negative for adenopathy.    Allergies  Review of patient's allergies indicates no known allergies.  Home Medications   Current Outpatient Rx  Name Route Sig Dispense Refill  . ACETAMINOPHEN 500 MG PO TABS Oral Take 1,000 mg by mouth every 6 (six) hours as needed. For pain.    . ADULT MULTIVITAMIN W/MINERALS CH Oral Take 1 tablet by mouth daily.      BP 131/81  Pulse 81  Temp 98.2 F (36.8 C) (Oral)  Resp 18  SpO2 100%  Physical Exam  Nursing note and vitals reviewed. Constitutional: She appears well-developed and well-nourished.  HENT:  Head: Normocephalic and atraumatic.  Right Ear: External ear and ear canal normal. No drainage or tenderness. No mastoid tenderness.  Left Ear: There is drainage and tenderness. No swelling. No mastoid tenderness.       Tenderness exhibited with tugging  on the pinna. Debris in the left ear canal. Tenderness with insertion of speculum.  Symptoms consistent with otitis externa.  Eyes: Conjunctivae normal are normal. Right eye exhibits no discharge. Left eye exhibits no discharge.  Neck: Normal range of motion. Neck supple.  Cardiovascular: Normal rate, regular rhythm and normal heart sounds.   Pulmonary/Chest: Effort normal and breath sounds normal. No respiratory distress.  Abdominal: Soft. There is no tenderness.  Neurological: She is alert.  Skin: Skin is warm and dry.  Psychiatric: She has a normal mood and affect.     ED Course  Procedures (including critical care time)  Labs Reviewed - No data to display No results found.   1. Otitis externa     5:00 PM Patient seen and examined. Medications ordered.   Vital signs reviewed and are as follows: Filed Vitals:   02/13/12 1640  BP: 131/81  Pulse: 81  Temp: 98.2 F (36.8 C)  Resp: 18   Patient instructed to use ear drops, 4 drops in left ear twice a day for 7 days. Patient urged to return with fever, redness or swelling of the ear or around the ear, worsening symptoms. Patient verbalizes understanding and agrees with the plan.   MDM  Patient with exam consistent with otitis externa. There is no surrounding tenderness or redness that would indicate mastoiditis or malignant otitis externa. Do not suspect tympanic membrane perforation. Patient does not have systemic symptoms of illness or history of diabetes. Will treat with antibotic ear drops and good hygiene.         Renne Crigler, [place] 02/13/12 2005

## 2012-02-13 NOTE — ED Notes (Signed)
Patient reports that she has had left ear aching and a light brown drainage  X 1 week.  Patient denies any fever or sinus drainage.

## 2012-02-14 NOTE — ED Provider Notes (Signed)
Medical screening examination/treatment/procedure(s) were performed by non-physician practitioner and as supervising physician I was immediately available for consultation/collaboration.  Trenell Concannon R. Lash Matulich, MD 02/14/12 0015 

## 2012-08-11 ENCOUNTER — Other Ambulatory Visit: Payer: Self-pay

## 2012-10-29 ENCOUNTER — Emergency Department (HOSPITAL_COMMUNITY): Payer: Self-pay

## 2012-10-29 ENCOUNTER — Encounter (HOSPITAL_COMMUNITY): Payer: Self-pay

## 2012-10-29 ENCOUNTER — Emergency Department (HOSPITAL_COMMUNITY)
Admission: EM | Admit: 2012-10-29 | Discharge: 2012-10-29 | Disposition: A | Discharge status: Home / Self Care | Payer: Self-pay | Attending: Emergency Medicine | Admitting: Emergency Medicine

## 2012-10-29 DIAGNOSIS — IMO0002 Reserved for concepts with insufficient information to code with codable children: Secondary | ICD-10-CM | POA: Insufficient documentation

## 2012-10-29 DIAGNOSIS — M129 Arthropathy, unspecified: Secondary | ICD-10-CM | POA: Insufficient documentation

## 2012-10-29 DIAGNOSIS — E669 Obesity, unspecified: Secondary | ICD-10-CM | POA: Insufficient documentation

## 2012-10-29 DIAGNOSIS — G5600 Carpal tunnel syndrome, unspecified upper limb: Secondary | ICD-10-CM | POA: Insufficient documentation

## 2012-10-29 DIAGNOSIS — Y9389 Activity, other specified: Secondary | ICD-10-CM | POA: Insufficient documentation

## 2012-10-29 DIAGNOSIS — G5602 Carpal tunnel syndrome, left upper limb: Secondary | ICD-10-CM

## 2012-10-29 DIAGNOSIS — Z87891 Personal history of nicotine dependence: Secondary | ICD-10-CM | POA: Insufficient documentation

## 2012-10-29 DIAGNOSIS — R609 Edema, unspecified: Secondary | ICD-10-CM | POA: Insufficient documentation

## 2012-10-29 DIAGNOSIS — Y929 Unspecified place or not applicable: Secondary | ICD-10-CM | POA: Insufficient documentation

## 2012-10-29 MED ORDER — IBUPROFEN 800 MG PO TABS
800.0000 mg | ORAL_TABLET | Freq: Once | ORAL | Status: AC
  Administered 2012-10-29: 800 mg via ORAL
  Filled 2012-10-29: qty 1

## 2012-10-29 NOTE — ED Notes (Signed)
Pt presents with NAD- left wrist pain denies injury

## 2012-10-29 NOTE — ED Provider Notes (Signed)
Medical screening examination/treatment/procedure(s) were performed by non-physician practitioner and as supervising physician I was immediately available for consultation/collaboration.    Hermenia Fritcher R Tariyah Pendry, MD 10/29/12 1612 

## 2012-10-29 NOTE — ED Provider Notes (Signed)
History     CSN: [id number]  Arrival date & time 10/29/12  1326   First MD Initiated Contact with Patient 10/29/12 1421      Chief Complaint  Patient presents with  . Wrist Pain    x 3 weeks    (Consider location/radiation/quality/duration/timing/severity/associated sxs/prior treatment) HPI Pt is a 52yo female c/o left wrist pain x3 weeks.  Pain is achy and sharp, 7/10.  Noticed mild edema in left wrist and states it feels like her bone is moving sometimes.  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  No known injury.  Tried tylenol and aspirin with minimal relief. Rubbing also helps.  Denies redness or warmth to area.   Past Medical History  Diagnosis Date  . Arthritis     Past Surgical History  Procedure Laterality Date  . Abdominal hysterectomy      2005  . Arthroscopy of left knee  2008    for cartilage surgery  . Cesarean section      2 previous    Family History  Problem Relation Age of Onset  . Diabetes Mother   . Hypertension Mother     History  Substance Use Topics  . Smoking status: Former Smoker -- 0.25 packs/day  . Smokeless tobacco: Never Used  . Alcohol Use: Yes     Comment: occassionally    OB History   Grav Para Term Preterm Abortions TAB SAB Ect Mult Living   3 2 2  1 1    2       Review of Systems  Musculoskeletal: Positive for myalgias, joint swelling and arthralgias. Negative for back pain and gait problem.  Skin: Negative for color change, pallor, rash and wound.    Allergies  Review of patient's allergies indicates no known allergies.  Home Medications   Current Outpatient Rx  Name  Route  Sig  Dispense  Refill  . acetaminophen (TYLENOL) 500 MG tablet   Oral   Take 1,000 mg by mouth every 6 (six) hours as needed. For pain.         Marland Kitchen ibuprofen (ADVIL,MOTRIN) 200 MG tablet   Oral   Take 200 mg by mouth every 6 (six) hours as needed for pain.         . Multiple Vitamin (MULTIVITAMIN WITH MINERALS) TABS   Oral   Take 1  tablet by mouth daily.           BP 139/86  Pulse 79  Temp(Src) 98.3 F (36.8 C)  Resp 16  Ht 4\' 11"  (1.499 m)  Wt 230 lb (104.327 kg)  BMI 46.43 kg/m2  SpO2 100%  Physical Exam  Nursing note and vitals reviewed. Constitutional: She appears well-developed and well-nourished. No distress.  Obese female seated comfortably in exam chair, rubbing left wrist  HENT:  Head: Normocephalic and atraumatic.  Eyes: Conjunctivae are normal. No scleral icterus.  Neck: Normal range of motion.  Cardiovascular: Normal rate, regular rhythm and normal heart sounds.   Pulmonary/Chest: Effort normal and breath sounds normal. No respiratory distress. She has no wheezes.  Musculoskeletal: Normal range of motion. She exhibits tenderness ( tenderness over volar and radial aspect of left wrist  ). She exhibits no edema.  Positive phalen's test   Neurological: She is alert.  Skin: Skin is warm and dry. She is not diaphoretic.  Psychiatric: She has a normal mood and affect. Her behavior is normal.    ED Course  Procedures (including critical care time)  Labs Reviewed - No  data to display Dg Wrist Complete Left  10/29/2012   *RADIOLOGY REPORT*  Clinical Data: Wrist pain without trauma.  LEFT WRIST - COMPLETE 3+ VIEW  Comparison: None.  Findings: Scaphoid intact. No acute fracture or dislocation.  Joint spaces maintained.  IMPRESSION: No acute osseous abnormality.   Original Report Authenticated By: Jeronimo Greaves, M.D.     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  Phalen's test: positive, CMS in tact.  No new injury, do not believe imaging is needed at this time.    Rx: ibuprofen, cockup splint.  F/u with [facility] in 2-3 weeks if pain not improving.  Vitals: unremarkable. Discharged in stable condition.         Junius Finner, PA-C 10/29/12 1548

## 2012-10-29 NOTE — Progress Notes (Signed)
[facility] has seen patient and provided her with a list of primary care resources. °

## 2014-04-05 ENCOUNTER — Encounter (HOSPITAL_COMMUNITY): Payer: Self-pay

## 2014-07-22 ENCOUNTER — Emergency Department (HOSPITAL_COMMUNITY)
Admission: EM | Admit: 2014-07-22 | Discharge: 2014-07-22 | Disposition: A | Discharge status: Home / Self Care | Payer: No Typology Code available for payment source | Attending: Emergency Medicine | Admitting: Emergency Medicine

## 2014-07-22 ENCOUNTER — Encounter (HOSPITAL_COMMUNITY): Payer: Self-pay

## 2014-07-22 ENCOUNTER — Emergency Department (HOSPITAL_COMMUNITY): Payer: No Typology Code available for payment source

## 2014-07-22 DIAGNOSIS — M545 Low back pain: Secondary | ICD-10-CM

## 2014-07-22 DIAGNOSIS — M1990 Unspecified osteoarthritis, unspecified site: Secondary | ICD-10-CM | POA: Insufficient documentation

## 2014-07-22 DIAGNOSIS — Z87891 Personal history of nicotine dependence: Secondary | ICD-10-CM | POA: Insufficient documentation

## 2014-07-22 DIAGNOSIS — Z3202 Encounter for pregnancy test, result negative: Secondary | ICD-10-CM | POA: Insufficient documentation

## 2014-07-22 DIAGNOSIS — R109 Unspecified abdominal pain: Secondary | ICD-10-CM

## 2014-07-22 DIAGNOSIS — Z9889 Other specified postprocedural states: Secondary | ICD-10-CM | POA: Insufficient documentation

## 2014-07-22 DIAGNOSIS — R1032 Left lower quadrant pain: Secondary | ICD-10-CM | POA: Insufficient documentation

## 2014-07-22 DIAGNOSIS — Z90710 Acquired absence of both cervix and uterus: Secondary | ICD-10-CM | POA: Insufficient documentation

## 2014-07-22 DIAGNOSIS — R10814 Left lower quadrant abdominal tenderness: Secondary | ICD-10-CM

## 2014-07-22 LAB — BASIC METABOLIC PANEL
ANION GAP: 7 (ref 5–15)
BUN: 14 mg/dL (ref 6–23)
CALCIUM: 9.4 mg/dL (ref 8.4–10.5)
CHLORIDE: 108 mmol/L (ref 96–112)
CO2: 25 mmol/L (ref 19–32)
CREATININE: 0.83 mg/dL (ref 0.50–1.10)
GFR, EST NON AFRICAN AMERICAN: 79 mL/min — AB (ref >90)
GLUCOSE: 90 mg/dL (ref 70–99)
Potassium: 4.2 mmol/L (ref 3.5–5.1)
Sodium: 140 mmol/L (ref 135–145)

## 2014-07-22 LAB — CBC WITH DIFFERENTIAL/PLATELET
Basophils Absolute: 0 10*3/uL (ref 0.0–0.1)
Basophils Relative: 0 % (ref 0–1)
EOS ABS: 0.1 10*3/uL (ref 0.0–0.7)
Eosinophils Relative: 1 % (ref 0–5)
HCT: 41.9 % (ref 36.0–46.0)
HEMOGLOBIN: 13.9 g/dL (ref 12.0–15.0)
LYMPHS ABS: 3.7 10*3/uL (ref 0.7–4.0)
LYMPHS PCT: 56 % — AB (ref 12–46)
MCH: 28.7 pg (ref 26.0–34.0)
MCHC: 33.2 g/dL (ref 30.0–36.0)
MCV: 86.4 fL (ref 78.0–100.0)
MONOS PCT: 9 % (ref 3–12)
Monocytes Absolute: 0.6 10*3/uL (ref 0.1–1.0)
NEUTROS PCT: 34 % — AB (ref 43–77)
Neutro Abs: 2.3 10*3/uL (ref 1.7–7.7)
Platelets: 212 10*3/uL (ref 150–400)
RBC: 4.85 MIL/uL (ref 3.87–5.11)
RDW: 13 % (ref 11.5–15.5)
WBC: 6.7 10*3/uL (ref 4.0–10.5)

## 2014-07-22 LAB — URINALYSIS, ROUTINE W REFLEX MICROSCOPIC
BILIRUBIN URINE: NEGATIVE
GLUCOSE, UA: NEGATIVE mg/dL
Hgb urine dipstick: NEGATIVE
Ketones, ur: NEGATIVE mg/dL
LEUKOCYTES UA: NEGATIVE
Nitrite: NEGATIVE
PH: 5.5 (ref 5.0–8.0)
Protein, ur: NEGATIVE mg/dL
SPECIFIC GRAVITY, URINE: 1.035 — AB (ref 1.005–1.030)
Urobilinogen, UA: 1 mg/dL (ref 0.0–1.0)

## 2014-07-22 LAB — POC URINE PREG, ED: Preg Test, Ur: NEGATIVE

## 2014-07-22 MED ORDER — MORPHINE SULFATE 4 MG/ML IJ SOLN
4.0000 mg | Freq: Once | INTRAMUSCULAR | Status: AC
  Administered 2014-07-22: 4 mg via INTRAVENOUS
  Filled 2014-07-22: qty 1

## 2014-07-22 MED ORDER — HYDROCODONE-ACETAMINOPHEN 5-325 MG PO TABS: 2.0000 | ORAL_TABLET | ORAL | Status: DC | PRN

## 2014-07-22 NOTE — Discharge Instructions (Signed)
Return to the emergency room with worsening of symptoms, new symptoms or with symptoms that are concerning , especially fevers, abdominal pain in one area, unable to keep down fluids, blood in stool or vomit, severe pain, you feel faint, lightheaded or pass out. RICE: Rest, Ice (three cycles of 20 mins on, 20mins off at least twice a day), compression/brace, elevation. Heating pad works well for back pain. Ibuprofen 400mg  (2 tablets 200mg ) every 5-6 hours for 3-5 days. Norco for severe pain. Do not operate machinery, drive or drink alcohol while taking narcotics or muscle relaxers. Follow up with PCP if symptoms worsen or are persistent. Read below information and follow recommendations.   Abdominal Pain Many things can cause abdominal pain. Usually, abdominal pain is not caused by a disease and will improve without treatment. It can often be observed and treated at home. Your health care provider will do a physical exam and possibly order blood tests and X-rays to help determine the seriousness of your pain. However, in many cases, more time must pass before a clear cause of the pain can be found. Before that point, your health care provider may not know if you need more testing or further treatment. HOME CARE INSTRUCTIONS  Monitor your abdominal pain for any changes. The following actions may help to alleviate any discomfort you are experiencing:  Only take over-the-counter or prescription medicines as directed by your health care provider.  Do not take laxatives unless directed to do so by your health care provider.  Try a clear liquid diet (broth, tea, or water) as directed by your health care provider. Slowly move to a bland diet as tolerated. SEEK MEDICAL CARE IF:  You have unexplained abdominal pain.  You have abdominal pain associated with nausea or diarrhea.  You have pain when you urinate or have a bowel movement.  You experience abdominal pain that wakes you in the night.  You  have abdominal pain that is worsened or improved by eating food.  You have abdominal pain that is worsened with eating fatty foods.  You have a fever. SEEK IMMEDIATE MEDICAL CARE IF:   Your pain does not go away within 2 hours.  You keep throwing up (vomiting).  Your pain is felt only in portions of the abdomen, such as the right side or the left lower portion of the abdomen.  You pass bloody or black tarry stools. MAKE SURE YOU:  Understand these instructions.   Will watch your condition.   Will get help right away if you are not doing well or get worse.  Document Released: 02/28/2005 Document Revised: 05/26/2013 Document Reviewed: 01/28/2013 [facility] Patient Information 2015 [place], [place]. This information is not intended to replace advice given to you by your health care provider. Make sure you discuss any questions you have with your health care provider.

## 2014-07-22 NOTE — ED Notes (Signed)
Patient transported to CT 

## 2014-07-22 NOTE — ED Notes (Signed)
Pt presents with c/o left flank pain that started this morning. Pt reports urinary frequency as well as some burning with urination. Pt reports no hx of kidney stones.

## 2014-07-22 NOTE — ED Notes (Signed)
Patient was educated not to drive, operate heavy machinery, or drink alcohol while taking narcotic medication.  

## 2014-07-22 NOTE — ED Provider Notes (Signed)
CSN: [id number]     Arrival date & time 07/22/14  1533 History   First MD Initiated Contact with Patient 07/22/14 1540     Chief Complaint  Patient presents with  . Flank Pain     (Consider location/radiation/quality/duration/timing/severity/associated sxs/prior Treatment) HPI  Sally Reid is a 54 y.o. female with PMH of arthritis presenting with left flank and low back pain that started at 6 AM this morning. Pain is throbbing and at times sharp. Pain comes and goes. Walking makes the pain worse. Patient also endorses burning with urination as well as pain. No hematuria, cloudy urine or foul odor. Patient denies fever chills. No nausea, vomiting, diarrhea or hematuria or melanotic stools. Patient with abdominal hysterectomy as well as 2 cesarean. Patient denies heavy lifting. Patient with history of arthritis but states this is different type of pain as well as location. Patient denies numbness, tingling, weakness in lower extremities. No saddle anesthesia, bilateral bowel incontinence.    Past Medical History  Diagnosis Date  . Arthritis    Past Surgical History  Procedure Laterality Date  . Abdominal hysterectomy      2005  . Arthroscopy of left knee  2008    for cartilage surgery  . Cesarean section      2 previous   Family History  Problem Relation Age of Onset  . Diabetes Mother   . Hypertension Mother    History  Substance Use Topics  . Smoking status: Former Smoker -- 0.25 packs/day  . Smokeless tobacco: Never Used  . Alcohol Use: Yes     Comment: occassionally   OB History    Gravida Para Term Preterm AB TAB SAB Ectopic Multiple Living   Review of Systems 10 Systems reviewed and are negative for acute change except as noted in the HPI.    Allergies  Review of patient's allergies indicates no known allergies.  Home Medications   Prior to Admission medications   Medication Sig Start Date End Date Taking? Authorizing Provider   acetaminophen (TYLENOL) 500 MG tablet Take 1,000 mg by mouth every 6 (six) hours as needed. For pain.   Yes Historical Provider, MD  HYDROcodone-acetaminophen (NORCO/VICODIN) 5-325 MG per tablet Take 2 tablets by mouth every 4 (four) hours as needed. 07/22/14   Benetta Spar L Durell Lofaso, PA-C   BP 158/92 mmHg  Pulse 57  Temp(Src) 98.5 F (36.9 C) (Oral)  Resp 16  SpO2 99% Physical Exam  Constitutional: She appears well-developed and well-nourished. No distress.  HENT:  Head: Normocephalic and atraumatic.  Mouth/Throat: Oropharynx is clear and moist.  Eyes: Conjunctivae and EOM are normal. Right eye exhibits no discharge. Left eye exhibits no discharge.  Cardiovascular: Normal rate and regular rhythm.   Pulmonary/Chest: Effort normal and breath sounds normal. No respiratory distress. She has no wheezes.  Abdominal: Soft. Bowel sounds are normal. She exhibits no distension.  Left lower quadrant abdominal pain without rebound, rigidity, guarding. Patient with left flank and left low back tenderness.   Musculoskeletal:  No midline back tenderness, step off or crepitus.   Neurological: She is alert. She exhibits normal muscle tone. Coordination normal.  Equal muscle tone. 5/5 strength in lower extremities. DTR equal and intact. Negative straight leg test. Normal gait.  Skin: Skin is warm and dry. She is not diaphoretic.  Nursing note and vitals reviewed.   ED Course  Procedures (including critical care time) Labs Review Labs Reviewed  CBC WITH DIFFERENTIAL/PLATELET - Abnormal; Notable for the following:    Neutrophils Relative % 34 (*)    Lymphocytes Relative 56 (*)    All other components within normal limits  BASIC METABOLIC PANEL - Abnormal; Notable for the following:    GFR calc non Af Amer 79 (*)    All other components within normal limits  URINALYSIS, ROUTINE W REFLEX MICROSCOPIC - Abnormal; Notable for the following:    APPearance CLOUDY (*)    Specific Gravity, Urine 1.035 (*)     All other components within normal limits  POC URINE PREG, ED    Imaging Review Ct Abdomen Pelvis Wo Contrast  07/22/2014   CLINICAL DATA:  Flank pain. Pt presents with c/o left flank pain that started this morning. Pt reports urinary frequency as well as some burning with urination. Pt reports no hx of kidney stones.  EXAM: CT ABDOMEN AND PELVIS WITHOUT CONTRAST  TECHNIQUE: Multidetector CT imaging of the abdomen and pelvis was performed following the standard protocol without IV contrast.  COMPARISON:  None.  FINDINGS: Kidneys normal in size, orientation and position. No renal masses or stones. No hydronephrosis. Normal ureters. Normal bladder.  Uterus surgically absent.  No pelvic masses.  Clear lung bases.  Liver, spleen, gallbladder, pancreas, adrenal glands:  Normal.  Scattered left colon diverticula. Colon otherwise unremarkable. Normal small bowel. Normal appendix.  Mild degenerative changes of the lower lumbar spine. No osteoblastic or osteolytic lesions.  IMPRESSION: 1. No acute findings. No findings to explain left flank pain. No renal or ureteral stones or obstructive uropathy. 2. Status post hysterectomy. Mild degenerative changes of the lower lumbar spine.   Electronically Signed   By: Amie Portlandavid  Ormond M.D.   On: 07/22/2014 17:32     EKG Interpretation None      MDM   Final diagnoses:  Left flank pain  LLQ abdominal tenderness  Left low back pain, with sciatica presence unspecified   Pt complaining of left flank and low back pain as well as LLQ abdominal tenderness without abdominal symptoms. Pt with dysuria. VSS. Mild LLQ abdominal pain, and flank pain. Pt given IV bolus and pain managed in ED. UA without evidence of infection, lab work up reassuring. CT without acute abnormalities. No nephrolithiasis. Mild degenerative changes of lumbar spine. Repeat abdominal exam with decreased tenderness and no evidence of peritonitis. I doubt acute abdominal process. Pt back pain likely MSK.  No red flags for back pain and normal neurological exam. Pt given script for pain medication. Driving and Sedation precautions provided. Pt given strict return precautions for worsening abdominal pain, neurological back symptoms and incontinence.  Discussed return precautions with patient. Discussed all results and patient verbalizes understanding and agrees with plan.    Louann SjogrenVictoria L Aloura Matsuoka, PA-C 07/23/14 1235  Donnetta HutchingBrian Cook, MD 07/24/14 1056

## 2014-08-17 ENCOUNTER — Emergency Department (INDEPENDENT_AMBULATORY_CARE_PROVIDER_SITE_OTHER)
Admission: EM | Admit: 2014-08-17 | Discharge: 2014-08-17 | Disposition: A | Discharge status: Home / Self Care | Payer: Self-pay | Source: Home / Self Care | Attending: Family Medicine | Admitting: Family Medicine

## 2014-08-17 ENCOUNTER — Encounter (HOSPITAL_COMMUNITY): Payer: Self-pay | Admitting: *Deleted

## 2014-08-17 DIAGNOSIS — S0502XA Injury of conjunctiva and corneal abrasion without foreign body, left eye, initial encounter: Secondary | ICD-10-CM

## 2014-08-17 DIAGNOSIS — H11003 Unspecified pterygium of eye, bilateral: Secondary | ICD-10-CM

## 2014-08-17 DIAGNOSIS — I10 Essential (primary) hypertension: Secondary | ICD-10-CM

## 2014-08-17 DIAGNOSIS — H1132 Conjunctival hemorrhage, left eye: Secondary | ICD-10-CM

## 2014-08-17 MED ORDER — POLYMYXIN B-TRIMETHOPRIM 10000-0.1 UNIT/ML-% OP SOLN: 2.0000 [drp] | Freq: Four times a day (QID) | OPHTHALMIC | Status: DC

## 2014-08-17 MED ORDER — KETOROLAC TROMETHAMINE 0.4 % OP SOLN: 1.0000 [drp] | Freq: Four times a day (QID) | OPHTHALMIC | Status: DC

## 2014-08-17 MED ORDER — TETRACAINE HCL 0.5 % OP SOLN
OPHTHALMIC | Status: AC
  Filled 2014-08-17: qty 2

## 2014-08-17 NOTE — ED Provider Notes (Signed)
CSN: [id number]     Arrival date & time 08/17/14  9528 History   First MD Initiated Contact with Patient 08/17/14 [phone]     Chief Complaint  Patient presents with  . Eye Pain   (Consider location/radiation/quality/duration/timing/severity/associated sxs/prior Treatment) HPI   Eye pain: L eye. Started this morning. Constant. Achy pain. Sensation of something in the eye. Red. Denies eye trauma. No history of allergies. Vision intact. Denies discahrge. Getting worse. Has not tried anything for her symptoms. Denies fevers, headache, dizziness, lightheadedness, syncope, chest pain, palpitations. 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 Patient denies any forceful coughing or sneezing.   Past Medical History  Diagnosis Date  . Arthritis    Past Surgical History  Procedure Laterality Date  . Abdominal hysterectomy      2005  . Arthroscopy of left knee  2008    for cartilage surgery  . Cesarean section      2 previous   Family History  Problem Relation Age of Onset  . Diabetes Mother   . Hypertension Mother    History  Substance Use Topics  . Smoking status: Former Smoker -- 0.25 packs/day  . Smokeless tobacco: Never Used  . Alcohol Use: Yes     Comment: occassionally   OB History    Gravida Para Term Preterm AB TAB SAB Ectopic Multiple Living   Review of Systems Per HPI with all other pertinent systems negative.   Allergies  Review of patient's allergies indicates no known allergies.  Home Medications   Prior to Admission medications   Medication Sig Start Date End Date Taking? Authorizing Provider  acetaminophen (TYLENOL) 500 MG tablet Take 1,000 mg by mouth every 6 (six) hours as needed. For pain.    Historical Provider, MD  HYDROcodone-acetaminophen (NORCO/VICODIN) 5-325 MG per tablet Take 2 tablets by mouth every 4 (four) hours as needed. 07/22/14   Oswaldo Conroy, PA-C  ketorolac (ACULAR) 0.4 % SOLN Place 1 drop  into the left eye 4 (four) times daily. Treat for 2-3 days 08/17/14   Ozella Rocks, MD  trimethoprim-polymyxin b [facility]) ophthalmic solution Place 2 drops into the left eye every 6 (six) hours. Treat for 5 days 08/17/14   Ozella Rocks, MD   BP 156/88 mmHg  Pulse 62  Temp(Src) 99 F (37.2 C) (Oral)  Resp 16  SpO2 100% Physical Exam  Constitutional: She is oriented to person, place, and time. She appears well-developed and well-nourished.  HENT:  Head: Normocephalic and atraumatic.  Eyes: EOM are normal. Pupils are equal, round, and reactive to light.  Bilateral pterygium. Left eye with some conjunctival hemorrhage from the 6:00 to 9:00 positions without displacement of the iris. Minimal discharge of the left eye. Under foreseen exam a area of hyperlucency is noted around the 9:00 position without any vitreous flow. No foreign body  Neck: Normal range of motion. Neck supple.  Cardiovascular: Normal rate.   No murmur heard. Pulmonary/Chest: Effort normal and breath sounds normal.  Abdominal: Soft. Bowel sounds are normal. She exhibits no distension.  Musculoskeletal: Normal range of motion. She exhibits no edema or tenderness.  Neurological: She is oriented to person, place, and time.  Skin: Skin is warm.  Psychiatric: She has a normal mood and affect. Judgment and thought content normal.    ED Course  Procedures (including critical care time) Labs Review Labs Reviewed - No  data to display  Imaging Review No results found.   MDM   1. Corneal abrasion, left, initial encounter   2. Pterygium eye, bilateral   3. Subconjunctival hemorrhage of left eye   4. Essential hypertension    Start Polytrim and ketorolac drops. Patient to follow-up with ophthalmology or emergency room if I become significantly worse. Discussed progression of pterygium Discussed prolonged resolution of septic conjunctival hemorrhage. Patient to follow up with PCP as needed for hypertension. May be an  isolated event as patient is in some distress today and notes typically normal blood pressure.  Precautions given and all questions answered  Shelly Flattenavid Merrell, MD Family Medicine 08/17/2014, 9:50 AM      Ozella Rocksavid J Merrell, MD 08/17/14 [phone]

## 2014-08-17 NOTE — ED Notes (Signed)
Pt's left eye is red. Pt states it feels like she has sand in her eye. Onset this am. Denies any additional symptoms.

## 2014-08-17 NOTE — Discharge Instructions (Signed)
You have a infection and scratch of her left eye. You also have what's called a sub-conjunctiva hemorrhage, or commonly known as bleeding in the outer layer of the eye. This will require antibiotic ointment to clear up. Please know that the bleeding may take several weeks to become reabsorbed. Please go to the emergency room after I becomes painful or you have significant vision change. Please use the ketorolac eyedrops for any eye discomfort

## 2015-08-11 ENCOUNTER — Encounter (HOSPITAL_COMMUNITY): Payer: Self-pay

## 2015-08-11 ENCOUNTER — Emergency Department (HOSPITAL_COMMUNITY)
Admission: EM | Admit: 2015-08-11 | Discharge: 2015-08-11 | Disposition: A | Discharge status: Home / Self Care | Payer: Self-pay | Attending: Emergency Medicine | Admitting: Emergency Medicine

## 2015-08-11 DIAGNOSIS — H578 Other specified disorders of eye and adnexa: Secondary | ICD-10-CM | POA: Insufficient documentation

## 2015-08-11 DIAGNOSIS — R51 Headache: Secondary | ICD-10-CM | POA: Insufficient documentation

## 2015-08-11 NOTE — ED Notes (Signed)
Pt c/o L side headache, L side facial "twitch" and L eye drainage since waking up this morning.  Pain score 6/10.  Pt reports "I thought it was a nerve and it would go away."  Facial symmetry noted.  Denies numbness and tingling.

## 2015-08-11 NOTE — ED Notes (Signed)
Pt called for room w/o answer.  

## 2015-08-30 ENCOUNTER — Encounter (HOSPITAL_COMMUNITY): Payer: Self-pay | Admitting: Emergency Medicine

## 2015-08-30 ENCOUNTER — Other Ambulatory Visit (HOSPITAL_COMMUNITY)
Admission: RE | Admit: 2015-08-30 | Discharge: 2015-08-30 | Disposition: A | Discharge status: Home / Self Care | Payer: Self-pay | Source: Ambulatory Visit | Attending: Family Medicine | Admitting: Family Medicine

## 2015-08-30 ENCOUNTER — Emergency Department (INDEPENDENT_AMBULATORY_CARE_PROVIDER_SITE_OTHER)
Admission: EM | Admit: 2015-08-30 | Discharge: 2015-08-30 | Disposition: A | Discharge status: Home / Self Care | Payer: Self-pay | Source: Home / Self Care | Attending: Family Medicine | Admitting: Family Medicine

## 2015-08-30 DIAGNOSIS — H1132 Conjunctival hemorrhage, left eye: Secondary | ICD-10-CM

## 2015-08-30 DIAGNOSIS — A499 Bacterial infection, unspecified: Secondary | ICD-10-CM

## 2015-08-30 DIAGNOSIS — N760 Acute vaginitis: Secondary | ICD-10-CM

## 2015-08-30 DIAGNOSIS — N898 Other specified noninflammatory disorders of vagina: Secondary | ICD-10-CM

## 2015-08-30 DIAGNOSIS — B9689 Other specified bacterial agents as the cause of diseases classified elsewhere: Secondary | ICD-10-CM

## 2015-08-30 LAB — POCT URINALYSIS DIP (DEVICE)
Bilirubin Urine: NEGATIVE
GLUCOSE, UA: NEGATIVE mg/dL
Hgb urine dipstick: NEGATIVE
Ketones, ur: NEGATIVE mg/dL
NITRITE: NEGATIVE
PROTEIN: NEGATIVE mg/dL
Specific Gravity, Urine: >=1.03 (ref 1.005–1.030)
UROBILINOGEN UA: 1 mg/dL (ref 0.0–1.0)
pH: 6 (ref 5.0–8.0)

## 2015-08-30 MED ORDER — METRONIDAZOLE 500 MG PO TABS: 500.0000 mg | ORAL_TABLET | Freq: Two times a day (BID) | ORAL | Status: DC

## 2015-08-30 NOTE — ED Provider Notes (Signed)
CSN: [id number]     Arrival date & time 08/30/15  1650 History   First MD Initiated Contact with Patient 08/30/15 1822     Chief Complaint  Patient presents with  . Eye Problem  . Vaginal Itching   (Consider location/radiation/quality/duration/timing/severity/associated sxs/prior Treatment) HPI Pt presents with a couple of complaints #1 left red eye. States broken blood vessel in eye. Has happened before, no associated pain or vision changes.  #2 vaginal discharge and knot on vagina. Discharge for 1 week, with an odor. No change in sexual partners.  Also has tender knot just below vagina right side.  Lesion has been present for several weeks.  No treatment, but pain with intercourse.  Past Medical History  Diagnosis Date  . Arthritis    Past Surgical History  Procedure Laterality Date  . Abdominal hysterectomy      2005  . Arthroscopy of left knee  2008    for cartilage surgery  . Cesarean section      2 previous  . Ankle fracture surgery Left    Family History  Problem Relation Age of Onset  . Diabetes Mother   . Hypertension Mother    Social History  Substance Use Topics  . Smoking status: Former Smoker -- 0.25 packs/day  . Smokeless tobacco: Never Used  . Alcohol Use: Yes     Comment: occassionally   OB History    Gravida Para Term Preterm AB TAB SAB Ectopic Multiple Living   Review of Systems Vaginal itching and "knot in vagina" left red eye Allergies  Review of patient's allergies indicates no known allergies.  Home Medications   Prior to Admission medications   Medication Sig Start Date End Date Taking? Authorizing Provider  acetaminophen (TYLENOL) 500 MG tablet Take 1,000 mg by mouth every 6 (six) hours as needed. For pain.    Historical Provider, MD  HYDROcodone-acetaminophen (NORCO/VICODIN) 5-325 MG per tablet Take 2 tablets by mouth every 4 (four) hours as needed. 07/22/14   Oswaldo Conroy, PA-C  ketorolac (ACULAR) 0.4 % SOLN  Place 1 drop into the left eye 4 (four) times daily. Treat for 2-3 days 08/17/14   Ozella Rocks, MD  metroNIDAZOLE (FLAGYL) 500 MG tablet Take 1 tablet (500 mg total) by mouth 2 (two) times daily. 08/30/15   Tharon Aquas, PA  trimethoprim-polymyxin b (POLYTRIM) ophthalmic solution Place 2 drops into the left eye every 6 (six) hours. Treat for 5 days 08/17/14   Ozella Rocks, MD   Meds Ordered and Administered this Visit  Medications - No data to display  BP 134/85 mmHg  Pulse 64  Temp(Src) 99.3 F (37.4 C) (Oral)  Resp 16  SpO2 99% No data found.   Physical Exam NURSES NOTES AND VITAL SIGNS REVIEWED. CONSTITUTIONAL: Well developed, well nourished, no acute distress HEENT: normocephalic, atraumatic, right and left TM's are normal EYES: Conjunctiva normal NECK:normal ROM, supple, no adenopathy PULMONARY:No respiratory distress, normal effort Exam is performed with patient's permission Female nursing staff present to chaperone.  Perineum: clean, dry without lesions, no groin or inguinal Lymphadenopathy; urethra . No caruncle or prolapse noted.  Vaginal canal: moderate amount thick white adherent discharge noted in canal with loss of rugae. Scant amount thin homogenous white-yellow discharge in fornix.  Cervix is pink and non-friable.    Bi-manual: no pain on palpation of cervix or adnexal structures, ovaries not enlarged.   ED Course  Procedures (including critical care time)  Labs Review Labs Reviewed  POCT URINALYSIS DIP (DEVICE) - Abnormal; Notable for the following:    Leukocytes, UA SMALL (*)    All other components within normal limits  CERVICOVAGINAL ANCILLARY ONLY    Imaging Review No results found.   Visual Acuity Review  Right Eye Distance:   Left Eye Distance:   Bilateral Distance:    Right Eye Near:   Left Eye Near:    Bilateral Near:       RX for flagyl  MDM   1. Subconjunctival hemorrhage of left eye   2. BV (bacterial vaginosis)   3.  Vaginal wall cyst     Patient is reassured that there are no issues that require transfer to higher level of care at this time or additional tests. Patient is advised to continue home symptomatic treatment. Patient is advised that if there are new or worsening symptoms to attend the emergency department, contact primary care provider, or return to UC. Instructions of care provided discharged home in stable condition. Return to work/school note provided.   THIS NOTE WAS GENERATED USING A VOICE RECOGNITION SOFTWARE PROGRAM. ALL REASONABLE EFFORTS  WERE MADE TO PROOFREAD THIS DOCUMENT FOR ACCURACY.  I have verbally reviewed the discharge instructions with the patient. A printed AVS was given to the patient.  All questions were answered prior to discharge.      Tharon AquasFrank C Leanza Shepperson, PA 08/31/15 1010

## 2015-08-30 NOTE — ED Notes (Signed)
[facility] of [facility] problem with her left eye. She stated that she was previously diagnosed with a ruptured blood vessel and it eventually went away but she stated that it recurs every three weeks or so. The patient also stated that she had a "bump" on the outside of her vagina as well as some internal itching x 2 weeks.

## 2015-08-30 NOTE — Discharge Instructions (Signed)
Bacterial Vaginosis Bacterial vaginosis is a vaginal infection that occurs when the normal balance of bacteria in the vagina is disrupted. It results from an overgrowth of certain bacteria. This is the most common vaginal infection in women of childbearing age. Treatment is important to prevent complications, especially in pregnant women, as it can cause a premature delivery. CAUSES  Bacterial vaginosis is caused by an increase in harmful bacteria that are normally present in smaller amounts in the vagina. Several different kinds of bacteria can cause bacterial vaginosis. However, the reason that the condition develops is not fully understood. RISK FACTORS Certain activities or behaviors can put you at an increased risk of developing bacterial vaginosis, including:  Having a new sex partner or multiple sex partners.  Douching.  Using an intrauterine device (IUD) for contraception. Women do not get bacterial vaginosis from toilet seats, bedding, swimming pools, or contact with objects around them. SIGNS AND SYMPTOMS  Some women with bacterial vaginosis have no signs or symptoms. Common symptoms include:  Grey vaginal discharge.  A fishlike odor with discharge, especially after sexual intercourse.  Itching or burning of the vagina and vulva.  Burning or pain with urination. DIAGNOSIS  Your health care provider will take a medical history and examine the vagina for signs of bacterial vaginosis. A sample of vaginal fluid may be taken. Your health care provider will look at this sample under a microscope to check for bacteria and abnormal cells. A vaginal pH test may also be done.  TREATMENT  Bacterial vaginosis may be treated with antibiotic medicines. These may be given in the form of a pill or a vaginal cream. A second round of antibiotics may be prescribed if the condition comes back after treatment. Because bacterial vaginosis increases your risk for sexually transmitted diseases, getting  treated can help reduce your risk for chlamydia, gonorrhea, HIV, and herpes. HOME CARE INSTRUCTIONS   Only take over-the-counter or prescription medicines as directed by your health care provider.  If antibiotic medicine was prescribed, take it as directed. Make sure you finish it even if you start to feel better.  Tell all sexual partners that you have a vaginal infection. They should see their health care provider and be treated if they have problems, such as a mild rash or itching.  During treatment, it is important that you follow these instructions:  Avoid sexual activity or use condoms correctly.  Do not douche.  Avoid alcohol as directed by your health care provider.  Avoid breastfeeding as directed by your health care provider. SEEK MEDICAL CARE IF:   Your symptoms are not improving after 3 days of treatment.  You have increased discharge or pain.  You have a fever. MAKE SURE YOU:  Understand these instructions.Bartholin Cyst or Abscess A Bartholin cyst is a fluid-filled sac that forms on a Bartholin gland. Bartholin glands are small glands that are found in the folds of skin (labia) on the sides of the lower opening of the vagina. This type of cyst causes a bulge on the side of the vagina. A cyst that is not large or infected may not cause problems. However, if the fluid in the cyst becomes infected, the cyst can turn into an abscess. An abscess may cause discomfort or pain. HOME CARE Take medicines only as told by your doctor. If you were prescribed an antibiotic medicine, finish all of it even if you start to feel better. Apply warm, wet compresses to the area or take warm, shallow baths that  cover your pelvic area (sitz baths). Do this many times each day or as told by your doctor. Do not squeeze the cyst. Do not apply heavy pressure to it. Do not have sex until the cyst has gone away. If your cyst or abscess was opened by your doctor, a small piece of gauze or a drain  may have been placed in the area. That lets the cyst drain. Do not remove the gauze or the drain until your doctor tells you it is okay to do that. Do not wear tampons. Wear feminine pads as needed for any fluid or blood. Keep all follow-up visits as told by your doctor. This is important. GET HELP IF: Your pain, puffiness (swelling), or redness in the area of the cyst gets worse. You have fluid or pus pus coming from the cyst. You have a fever.   This information is not intended to replace advice given to you by your health care provider. Make sure you discuss any questions you have with your health care provider.   Document Released: 08/17/2008 Document Revised: 10/05/2014 Document Reviewed: 01/04/2014 Elsevier Interactive Patient Education 2016 [employer].    Will watch your condition.  Will get help right away if you are not doing well or get worse. FOR MORE INFORMATION  Centers for Disease Control and Prevention, Division of STD Prevention: [employer]s.[employer].za American Sexual Health Association (ASHA): www.ashastd.org    This information is not intended to replace advice given to you by your health care provider. Make sure you discuss any questions you have with your health care provider.   Document Released: 05/21/2005 Document Revised: 06/11/2014 Document Reviewed: 12/31/2012 Elsevier Interactive Patient Education 2016 Elsevier Inc. Subconjunctival Hemorrhage Subconjunctival hemorrhage is bleeding that happens between the white part of your eye (sclera) and the clear membrane that covers the outside of your eye (conjunctiva). There are many tiny blood vessels near the surface of your eye. A subconjunctival hemorrhage happens when one or more of these vessels breaks and bleeds, causing a red patch to appear on your eye. This is similar to a bruise. Depending on the amount of bleeding, the red patch may only cover a small area of your eye or it may cover the entire visible part of  the sclera. If a lot of blood collects under the conjunctiva, there may also be swelling. Subconjunctival hemorrhages do not affect your vision or cause pain, but your eye may feel irritated if there is swelling. Subconjunctival hemorrhages usually do not require treatment, and they disappear on their own within two weeks. CAUSES This condition may be caused by:  Mild trauma, such as rubbing your eye too hard.  Severe trauma or blunt injuries.  Coughing, sneezing, or vomiting.  Straining, such as when lifting a heavy object.  High blood pressure.  Recent eye surgery.  A history of diabetes.  Certain medicines, especially blood thinners (anticoagulants).  Other conditions, such as eye tumors, bleeding disorders, or blood vessel abnormalities. Subconjunctival hemorrhages can happen without an obvious cause.  SYMPTOMS  Symptoms of this condition include:  A bright red or dark red patch on the white part of the eye.  The red area may spread out to cover a larger area of the eye before it goes away.  The red area may turn brownish-yellow before it goes away.  Swelling.  Mild eye irritation. DIAGNOSIS This condition is diagnosed with a physical exam. If your subconjunctival hemorrhage was caused by trauma, your health care provider may refer you to  an eye specialist (ophthalmologist) or another specialist to check for other injuries. You may have other tests, including:  An eye exam.  A blood pressure check.  Blood tests to check for bleeding disorders. If your subconjunctival hemorrhage was caused by trauma, X-rays or a CT scan may be done to check for other injuries. TREATMENT Usually, no treatment is needed. Your health care provider may recommend eye drops or cold compresses to help with discomfort. HOME CARE INSTRUCTIONS  Take over-the-counter and prescription medicines only as directed by your health care provider.  Use eye drops or cold compresses to help with  discomfort as directed by your health care provider.  Avoid activities, things, and environments that may irritate or injure your eye.  Keep all follow-up visits as told by your health care provider. This is important. SEEK MEDICAL CARE IF:  You have pain in your eye.  The bleeding does not go away within 3 weeks.  You keep getting new subconjunctival hemorrhages. SEEK IMMEDIATE MEDICAL CARE IF:  Your vision changes or you have difficulty seeing.  You suddenly develop severe sensitivity to light.  You develop a severe headache, persistent vomiting, confusion, or abnormal tiredness (lethargy).  Your eye seems to bulge or protrude from your eye socket.  You develop unexplained bruises on your body.  You have unexplained bleeding in another area of your body.   This information is not intended to replace advice given to you by your health care provider. Make sure you discuss any questions you have with your health care provider.   Document Released: 05/21/2005 Document Revised: 02/09/2015 Document Reviewed: 07/28/2014 Elsevier Interactive Patient Education [employer] Elsevier Inc.

## 2015-08-31 LAB — CERVICOVAGINAL ANCILLARY ONLY: Wet Prep (BD Affirm): NEGATIVE

## 2015-11-28 ENCOUNTER — Encounter (HOSPITAL_COMMUNITY): Payer: Self-pay | Admitting: Emergency Medicine

## 2015-11-28 ENCOUNTER — Emergency Department (HOSPITAL_COMMUNITY)
Admission: EM | Admit: 2015-11-28 | Discharge: 2015-11-28 | Disposition: A | Discharge status: Home / Self Care | Payer: Self-pay | Attending: Emergency Medicine | Admitting: Emergency Medicine

## 2015-11-28 ENCOUNTER — Emergency Department (HOSPITAL_COMMUNITY): Payer: Self-pay

## 2015-11-28 DIAGNOSIS — R109 Unspecified abdominal pain: Secondary | ICD-10-CM | POA: Insufficient documentation

## 2015-11-28 DIAGNOSIS — Z79899 Other long term (current) drug therapy: Secondary | ICD-10-CM | POA: Insufficient documentation

## 2015-11-28 DIAGNOSIS — M1990 Unspecified osteoarthritis, unspecified site: Secondary | ICD-10-CM | POA: Insufficient documentation

## 2015-11-28 DIAGNOSIS — M545 Low back pain, unspecified: Secondary | ICD-10-CM

## 2015-11-28 DIAGNOSIS — Z87891 Personal history of nicotine dependence: Secondary | ICD-10-CM | POA: Insufficient documentation

## 2015-11-28 LAB — COMPREHENSIVE METABOLIC PANEL
ALK PHOS: 65 U/L (ref 38–126)
ALT: 49 U/L (ref 14–54)
AST: 42 U/L — AB (ref 15–41)
Albumin: 3.6 g/dL (ref 3.5–5.0)
Anion gap: 6 (ref 5–15)
BUN: 14 mg/dL (ref 6–20)
CALCIUM: 9.3 mg/dL (ref 8.9–10.3)
CO2: 27 mmol/L (ref 22–32)
CREATININE: 0.86 mg/dL (ref 0.44–1.00)
Chloride: 105 mmol/L (ref 101–111)
Glucose, Bld: 76 mg/dL (ref 65–99)
Potassium: 4.1 mmol/L (ref 3.5–5.1)
Sodium: 138 mmol/L (ref 135–145)
Total Bilirubin: 0.6 mg/dL (ref 0.3–1.2)
Total Protein: 7.4 g/dL (ref 6.5–8.1)

## 2015-11-28 LAB — URINALYSIS, ROUTINE W REFLEX MICROSCOPIC
Bilirubin Urine: NEGATIVE
GLUCOSE, UA: NEGATIVE mg/dL
HGB URINE DIPSTICK: NEGATIVE
KETONES UR: NEGATIVE mg/dL
LEUKOCYTES UA: NEGATIVE
Nitrite: NEGATIVE
PROTEIN: NEGATIVE mg/dL
Specific Gravity, Urine: 1.026 (ref 1.005–1.030)
pH: 5.5 (ref 5.0–8.0)

## 2015-11-28 LAB — LIPASE, BLOOD: Lipase: 27 U/L (ref 11–51)

## 2015-11-28 LAB — CBC
HCT: 39.7 % (ref 36.0–46.0)
Hemoglobin: 14.1 g/dL (ref 12.0–15.0)
MCH: 29.6 pg (ref 26.0–34.0)
MCHC: 35.5 g/dL (ref 30.0–36.0)
MCV: 83.2 fL (ref 78.0–100.0)
PLATELETS: 207 10*3/uL (ref 150–400)
RBC: 4.77 MIL/uL (ref 3.87–5.11)
RDW: 12.7 % (ref 11.5–15.5)
WBC: 6 10*3/uL (ref 4.0–10.5)

## 2015-11-28 MED ORDER — HYDROCODONE-ACETAMINOPHEN 5-325 MG PO TABS: 1.0000 | ORAL_TABLET | Freq: Four times a day (QID) | ORAL | Status: DC | PRN

## 2015-11-28 MED ORDER — METHOCARBAMOL 500 MG PO TABS
750.0000 mg | ORAL_TABLET | Freq: Once | ORAL | Status: AC
  Administered 2015-11-28: 750 mg via ORAL
  Filled 2015-11-28: qty 2

## 2015-11-28 MED ORDER — NAPROXEN 500 MG PO TABS: 500.0000 mg | ORAL_TABLET | Freq: Two times a day (BID) | ORAL | Status: DC

## 2015-11-28 MED ORDER — METHOCARBAMOL 500 MG PO TABS: 500.0000 mg | ORAL_TABLET | Freq: Two times a day (BID) | ORAL | Status: DC

## 2015-11-28 MED ORDER — OXYCODONE-ACETAMINOPHEN 5-325 MG PO TABS
1.0000 | ORAL_TABLET | Freq: Once | ORAL | Status: AC
  Administered 2015-11-28: 1 via ORAL
  Filled 2015-11-28: qty 1

## 2015-11-28 NOTE — Discharge Instructions (Signed)
Take naprosyn for pain and inflammation as prescribed. Take robaxin for muscle spasms as needed. norco for sever pain only. Try heating pads. Stretches. Avoid strenuous activity. Follow up with a primary care doctor.    Back Pain, Adult Back pain is very common in adults.The cause of back pain is rarely dangerous and the pain often gets better over time.The cause of your back pain may not be known. Some common causes of back pain include:  Strain of the muscles or ligaments supporting the spine.  Wear and tear (degeneration) of the spinal disks.  Arthritis.  Direct injury to the back. For many people, back pain may return. Since back pain is rarely dangerous, most people can learn to manage this condition on their own. HOME CARE INSTRUCTIONS Watch your back pain for any changes. The following actions may help to lessen any discomfort you are feeling:  Remain active. It is stressful on your back to sit or stand in one place for long periods of time. Do not sit, drive, or stand in one place for more than 30 minutes at a time. Take short walks on even surfaces as soon as you are able.Try to increase the length of time you walk each day.  Exercise regularly as directed by your health care provider. Exercise helps your back heal faster. It also helps avoid future injury by keeping your muscles strong and flexible.  Do not stay in bed.Resting more than 1-2 days can delay your recovery.  Pay attention to your body when you bend and lift. The most comfortable positions are those that put less stress on your recovering back. Always use proper lifting techniques, including:  Bending your knees.  Keeping the load close to your body.  Avoiding twisting.  Find a comfortable position to sleep. Use a firm mattress and lie on your side with your knees slightly bent. If you lie on your back, put a pillow under your knees.  Avoid feeling anxious or stressed.Stress increases muscle tension and can  worsen back pain.It is important to recognize when you are anxious or stressed and learn ways to manage it, such as with exercise.  Take medicines only as directed by your health care provider. Over-the-counter medicines to reduce pain and inflammation are often the most helpful.Your health care provider may prescribe muscle relaxant drugs.These medicines help dull your pain so you can more quickly return to your normal activities and healthy exercise.  Apply ice to the injured area:  Put ice in a plastic bag.  Place a towel between your skin and the bag.  Leave the ice on for 20 minutes, 2-3 times a day for the first 2-3 days. After that, ice and heat may be alternated to reduce pain and spasms.  Maintain a healthy weight. Excess weight puts extra stress on your back and makes it difficult to maintain good posture. SEEK MEDICAL CARE IF:  You have pain that is not relieved with rest or medicine.  You have increasing pain going down into the legs or buttocks.  You have pain that does not improve in one week.  You have night pain.  You lose weight.  You have a fever or chills. SEEK IMMEDIATE MEDICAL CARE IF:   You develop new bowel or bladder control problems.  You have unusual weakness or numbness in your arms or legs.  You develop nausea or vomiting.  You develop abdominal pain.  You feel faint.   This information is not intended to replace advice given  to you by your health care provider. Make sure you discuss any questions you have with your health care provider.   Document Released: 05/21/2005 Document Revised: 06/11/2014 Document Reviewed: 09/22/2013 Elsevier Interactive Patient Education [employer].

## 2015-11-28 NOTE — ED Notes (Signed)
Pt c/o right flank pain radiating into RLQ abdomen, worse with urination, and urinary frequency onset yesterday morning. No nausea, emesis, diarrhea. No dysuria. Positive CVAT.

## 2015-11-28 NOTE — ED Provider Notes (Signed)
CSN: [id number]     Arrival date & time 11/28/15  1022 History   First MD Initiated Contact with Patient 11/28/15 1124     Chief Complaint  Patient presents with  . Back Pain     (Consider location/radiation/quality/duration/timing/severity/associated sxs/prior Treatment) HPI Sally BasquesMichelle Reid is a 55 y.o. female with hx of prior back pain, presents to ED with complaint of acute onset of right lower back pain yesterday. Pt states she woke up with pain. Denies injuries. Pain radiates around to the right lower abdomen. Reports some dysuria, urinary frequency. Denies n/v/d. No fever. No prior similar pain. No pain radiating down lower extremities. No weakness or numbness in extremities.Took aleve today which did not help. No hx of kidney stones. No vaginal discharge or bleeding, hx of hysterectomy.   Past Medical History  Diagnosis Date  . Arthritis    Past Surgical History  Procedure Laterality Date  . Abdominal hysterectomy      2005  . Arthroscopy of left knee  2008    for cartilage surgery  . Cesarean section      2 previous  . Ankle fracture surgery Left    Family History  Problem Relation Age of Onset  . Diabetes Mother   . Hypertension Mother    Social History  Substance Use Topics  . Smoking status: Former Smoker -- 0.25 packs/day  . Smokeless tobacco: Never Used  . Alcohol Use: Yes     Comment: occassionally   OB History    Gravida Para Term Preterm AB TAB SAB Ectopic Multiple Living   3 2 2  1 1    2      Review of Systems  Constitutional: Negative for fever and chills.  Respiratory: Negative for cough, chest tightness and shortness of breath.   Cardiovascular: Negative for chest pain, palpitations and leg swelling.  Gastrointestinal: Positive for abdominal pain. Negative for nausea, vomiting and diarrhea.  Genitourinary: Positive for dysuria and flank pain.  Musculoskeletal: Positive for back pain and arthralgias. Negative for myalgias, neck pain and neck  stiffness.  Skin: Negative for rash.  Neurological: Negative for dizziness, weakness and headaches.  All other systems reviewed and are negative.     Allergies  Review of patient's allergies indicates no known allergies.  Home Medications   Prior to Admission medications   Medication Sig Start Date End Date Taking? Authorizing Provider  naproxen sodium (ANAPROX) 220 MG tablet Take 440 mg by mouth once.   Yes Historical Provider, MD  metroNIDAZOLE (FLAGYL) 500 MG tablet Take 1 tablet (500 mg total) by mouth 2 (two) times daily. Patient not taking: Reported on 11/28/2015 08/30/15   Tharon AquasFrank C Patrick, PA   BP 156/77 mmHg  Pulse 69  Temp(Src) 98 F (36.7 C) (Oral)  Resp 16  SpO2 99% Physical Exam  Constitutional: She is oriented to person, place, and time. She appears well-developed and well-nourished. No distress.  HENT:  Head: Normocephalic.  Eyes: Conjunctivae are normal.  Neck: Neck supple.  Cardiovascular: Normal rate, regular rhythm and normal heart sounds.   Pulmonary/Chest: Effort normal and breath sounds normal. No respiratory distress. She has no wheezes. She has no rales.  Abdominal: Soft. Bowel sounds are normal. She exhibits no distension. There is tenderness. There is no rebound.  No CVA tenderness bilaterally. Mild tenderness in the right lower quadrant. No guarding or rebound tenderness.  Musculoskeletal: She exhibits no edema.  Tenderness to palpation in the right lower back, right paralumbar muscles. No pain with straight  right leg test.  Neurological: She is alert and oriented to person, place, and time.  Skin: Skin is warm and dry.  Psychiatric: She has a normal mood and affect. Her behavior is normal.  Nursing note and vitals reviewed.   ED Course  Procedures (including critical care time) Labs Review Labs Reviewed  COMPREHENSIVE METABOLIC PANEL - Abnormal; Notable for the following:    AST 42 (*)    All other components within normal limits  URINALYSIS,  ROUTINE W REFLEX MICROSCOPIC (NOT [facility]) - Abnormal; Notable for the following:    APPearance CLOUDY (*)    All other components within normal limits  LIPASE, BLOOD  CBC    Imaging Review Ct Renal Stone Study  11/28/2015  CLINICAL DATA:  Right side pain radiating to back since yesterday. EXAM: CT ABDOMEN AND PELVIS WITHOUT CONTRAST TECHNIQUE: Multidetector CT imaging of the abdomen and pelvis was performed following the standard protocol without IV contrast. COMPARISON:  07/22/2014 FINDINGS: Lower chest: Lung bases are clear. No effusions. Heart is normal size. Hepatobiliary: No focal hepatic abnormality. Gallbladder unremarkable. Pancreas: No focal abnormality or ductal dilatation. Spleen: No focal abnormality.  Normal size. Adrenals/Urinary Tract: No adrenal abnormality. No focal renal abnormality. No stones or hydronephrosis. Urinary bladder is unremarkable. Multiple calcified phleboliths adjacent to the right ureter. Stomach/Bowel: Appendix is normal. Stomach, large and small bowel grossly unremarkable. Vascular/Lymphatic: Scattered aortic and iliac calcifications. No aneurysm. No adenopathy. Reproductive: Prior hysterectomy.  No adnexal masses. Other: No free fluid or free air. Musculoskeletal: No acute bony abnormality or focal bone lesion. IMPRESSION: No renal or ureteral stones.  No hydronephrosis. Normal appendix. No acute findings. Electronically Signed   By: Charlett NoseKevin  Dover M.D.   On: 11/28/2015 12:47   I have personally reviewed and evaluated these images and lab results as part of my medical decision-making.   EKG Interpretation None      MDM   Final diagnoses:  Right-sided low back pain without sciatica   Patient with right lower back pain. Pain radiates into the right lower abdomen. Will check labs, urinalysis, will treat pain with Percocet and Robaxin.  2:02 PM Labs unremarkable, urinalysis with no signs of infection. She is afebrile. She is nontoxic appearing. She had no CVA  tenderness, but does have tenderness over paralumbar spinal muscles on the right. Question musculoskeletal pain. CT scan of abdomen and pelvis without contrast was obtained to rule out kidney stone. CT scan is normal, appendix looks normal, no kidney stone or hydronephrosis. Will treat for musculoskeletal pain with Robaxin, naproxen, Norco for severe pain. Follow-up with primary care doctor.  Filed Vitals:   11/28/15 1030 11/28/15 1242  BP: 156/77 136/86  Pulse: 69 57  Temp: 98 F (36.7 C) 98.1 F (36.7 C)  TempSrc: Oral Oral  Resp: 16 16  SpO2: 99% 100%      Jaynie Crumbleatyana Alwaleed Obeso, PA-C 11/28/15 1403  Derwood KaplanAnkit Nanavati, MD 11/30/15 1416

## 2015-11-28 NOTE — ED Notes (Signed)
Bed: WA17 Expected date:  Expected time:  Means of arrival:  Comments: 

## 2016-04-11 ENCOUNTER — Ambulatory Visit (HOSPITAL_COMMUNITY)
Admission: EM | Admit: 2016-04-11 | Discharge: 2016-04-11 | Disposition: A | Discharge status: Home / Self Care | Payer: Self-pay | Attending: Family Medicine | Admitting: Family Medicine

## 2016-04-11 ENCOUNTER — Encounter (HOSPITAL_COMMUNITY): Payer: Self-pay | Admitting: Emergency Medicine

## 2016-04-11 DIAGNOSIS — Z87891 Personal history of nicotine dependence: Secondary | ICD-10-CM | POA: Insufficient documentation

## 2016-04-11 DIAGNOSIS — G43709 Chronic migraine without aura, not intractable, without status migrainosus: Secondary | ICD-10-CM | POA: Insufficient documentation

## 2016-04-11 DIAGNOSIS — N941 Unspecified dyspareunia: Secondary | ICD-10-CM | POA: Insufficient documentation

## 2016-04-11 DIAGNOSIS — N898 Other specified noninflammatory disorders of vagina: Secondary | ICD-10-CM

## 2016-04-11 DIAGNOSIS — B373 Candidiasis of vulva and vagina: Secondary | ICD-10-CM | POA: Insufficient documentation

## 2016-04-11 DIAGNOSIS — N72 Inflammatory disease of cervix uteri: Secondary | ICD-10-CM | POA: Insufficient documentation

## 2016-04-11 DIAGNOSIS — E669 Obesity, unspecified: Secondary | ICD-10-CM | POA: Insufficient documentation

## 2016-04-11 DIAGNOSIS — M1990 Unspecified osteoarthritis, unspecified site: Secondary | ICD-10-CM | POA: Insufficient documentation

## 2016-04-11 DIAGNOSIS — M549 Dorsalgia, unspecified: Secondary | ICD-10-CM | POA: Insufficient documentation

## 2016-04-11 LAB — POCT URINALYSIS DIP (DEVICE)
GLUCOSE, UA: NEGATIVE mg/dL
Hgb urine dipstick: NEGATIVE
KETONES UR: NEGATIVE mg/dL
Nitrite: NEGATIVE
PROTEIN: 30 mg/dL — AB
SPECIFIC GRAVITY, URINE: 1.02 (ref 1.005–1.030)
UROBILINOGEN UA: 2 mg/dL — AB (ref 0.0–1.0)
pH: 6 (ref 5.0–8.0)

## 2016-04-11 MED ORDER — AZITHROMYCIN 250 MG PO TABS
ORAL_TABLET | ORAL | Status: AC
  Filled 2016-04-11: qty 4

## 2016-04-11 MED ORDER — CEFTRIAXONE SODIUM 250 MG IJ SOLR
INTRAMUSCULAR | Status: AC
  Filled 2016-04-11: qty 250

## 2016-04-11 MED ORDER — AZITHROMYCIN 250 MG PO TABS
1000.0000 mg | ORAL_TABLET | Freq: Once | ORAL | Status: AC
  Administered 2016-04-11: 1000 mg via ORAL

## 2016-04-11 MED ORDER — CEFTRIAXONE SODIUM 250 MG IJ SOLR
250.0000 mg | Freq: Once | INTRAMUSCULAR | Status: AC
  Administered 2016-04-11: 250 mg via INTRAMUSCULAR

## 2016-04-11 MED ORDER — FLUCONAZOLE 150 MG PO TABS: 150.0000 mg | ORAL_TABLET | Freq: Once | ORAL | 1 refills | Status: AC

## 2016-04-11 NOTE — ED Provider Notes (Addendum)
[facility]  CSN: [id number] Arrival date & time: 04/11/16  1004  History   Chief Complaint Chief Complaint  Patient presents with  . Vaginal Discharge    HPI Sally BasquesMichelle Reid is a 55 y.o. female presenting with a week of worsening dyspareunia in setting of 1 month of vaginal discharge. Discharge was clear/white turning green and malodorous not improved with monistat, not associated with bleeding. Vulvar itching/burning also noted. Mild dysuria also noted. No abd pain or fever. +h/o Tx for STI's. Singe female sexual partner from whom she is separated and she believes he has other sexual partners but he denies symptoms.   HPI  Past Medical History:  Diagnosis Date  . Arthritis     Patient Active Problem List   Diagnosis Date Noted  . CHEST PAIN 04/21/2009  . ELEVATED BP READING WITHOUT DX HYPERTENSION 04/21/2009  . OBESITY 12/08/2008  . 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 BACK PAIN 12/08/2008    Past Surgical History:  Procedure Laterality Date  . ABDOMINAL HYSTERECTOMY     2005  . ANKLE FRACTURE SURGERY Left   . arthroscopy of left knee  2008   for cartilage surgery  . CESAREAN SECTION     2 previous    OB History    Gravida Para Term Preterm AB Living   3 2 2   1 2    SAB TAB Ectopic Multiple Live Births     1             Home Medications    Prior to Admission medications   Medication Sig Start Date End Date Taking? Authorizing Provider  fluconazole (DIFLUCAN) 150 MG tablet Take 1 tablet (150 mg total) by mouth once. 04/11/16 04/11/16  Tyrone Nineyan B Holland Nickson, MD  HYDROcodone-acetaminophen (NORCO) 5-325 MG tablet Take 1 tablet by mouth every 6 (six) hours as needed for moderate pain. 11/28/15   Tatyana Kirichenko, PA-C  methocarbamol (ROBAXIN) 500 MG tablet Take 1 tablet (500 mg total) by mouth 2 (two) times daily. 11/28/15   Tatyana Kirichenko, PA-C  naproxen (NAPROSYN) 500 MG tablet Take 1 tablet (500 mg total) by mouth 2 (two) times  daily. 11/28/15   Tatyana Kirichenko, PA-C  naproxen sodium (ANAPROX) 220 MG tablet Take 440 mg by mouth once.    Historical Provider, MD    Family History Family History  Problem Relation Age of Onset  . Diabetes Mother   . Hypertension Mother     Social History Social History  Substance Use Topics  . Smoking status: Former Smoker    Packs/day: 0.25  . Smokeless tobacco: Never Used  . Alcohol use Yes     Comment: occassionally     Allergies   Patient has no known allergies.   Review of Systems Review of Systems Per HPI  Physical Exam Triage Vital Signs ED Triage Vitals  Enc Vitals Group     BP 04/11/16 1022 122/83     Pulse Rate 04/11/16 1022 85     Resp 04/11/16 1022 18     Temp 04/11/16 1022 98.1 F (36.7 C)     Temp Source 04/11/16 1022 Oral     SpO2 04/11/16 1022 98 %     Weight --      Height --      Head Circumference --      Peak Flow --      Pain Score 04/11/16 1027 5     Pain Loc --  Pain Edu? --      Excl. in GC? --    No data found.   Updated Vital Signs BP 122/83 (BP Location: Left Arm)   Pulse 85   Temp 98.1 F (36.7 C) (Oral)   Resp 18   SpO2 98%   Visual Acuity Right Eye Distance:   Left Eye Distance:   Bilateral Distance:    Right Eye Near:   Left Eye Near:    Bilateral Near:     Physical Exam  Constitutional: She is oriented to person, place, and time. She appears well-developed and well-nourished. No distress.  Eyes: EOM are normal. Pupils are equal, round, and reactive to light. No scleral icterus.  Neck: Neck supple. No JVD present.  Cardiovascular: Normal rate, regular rhythm, normal heart sounds and intact distal pulses.   No murmur heard. Pulmonary/Chest: Effort normal and breath sounds normal. No respiratory distress.  Abdominal: Soft. Bowel sounds are normal. She exhibits no distension. There is no tenderness.  Genitourinary:  Genitourinary Comments: Pelvic: Normal external female genitalia without lesions.  Vaginal mucosa without lesions or bleeding. Scant white/green discharge noted in vault. Cervix erythematous without purulent discharge from os. No cervical motion tenderness.   Stasha, CMA present throughout duration of exam.    Musculoskeletal: Normal range of motion. She exhibits no edema or tenderness.  Lymphadenopathy:    She has no cervical adenopathy.  Neurological: She is alert and oriented to person, place, and time. She exhibits normal muscle tone.  Skin: Skin is warm and dry.  Vitals reviewed.  UC Treatments / Results  Labs (all labs ordered are listed, but only abnormal results are displayed) Labs Reviewed  POCT URINALYSIS DIP (DEVICE) - Abnormal; Notable for the following:       Result Value   Bilirubin Urine SMALL (*)    Protein, ur 30 (*)    Urobilinogen, UA 2.0 (*)    Leukocytes, UA LARGE (*)    All other components within normal limits  URINE CULTURE  CERVICOVAGINAL ANCILLARY ONLY   EKG  EKG Interpretation None       Radiology No results found.  Procedures Procedures (including critical care time)  Medications Ordered in UC Medications  azithromycin (ZITHROMAX) tablet 1,000 mg (1,000 mg Oral Given 04/11/16 1120)  cefTRIAXone (ROCEPHIN) injection 250 mg (250 mg Intramuscular Given 04/11/16 1120)     Initial Impression / Assessment and Plan / UC Course  I have reviewed the triage vital signs and the nursing notes.  Pertinent labs & imaging results that were available during my care of the patient were reviewed by me and considered in my medical decision making (see chart for details).  Final Clinical Impressions(s) / UC Diagnoses   Final diagnoses:  Vaginal discharge  Cervicitis   55 y.o. female with h/o STI and recurrent symptoms of vaginal discharge and dyspareunia without systemic symptoms. Exam shows cervical inflammation and vaginal discharge without CMT or evidence of PID. Wet prep and GC/Chl swabs collected. Based on clinical suspicion, will  presumptively treat for GC/Chl and vulvovaginal candidiasis, and await results. Counseled Re: barrier use/abstinence.   UA with +leukocytes, no micro done. Will send to culture and treat if +.  New Prescriptions Discharge Medication List as of 04/11/2016 11:18 AM    START taking these medications   Details  fluconazole (DIFLUCAN) 150 MG tablet Take 1 tablet (150 mg total) by mouth once., Starting Wed 04/11/2016, Print         Tyrone Nineyan B Elisheba Mcdonnell, MD 04/11/16  1140  

## 2016-04-11 NOTE — ED Triage Notes (Signed)
[facility] a [facility] of a vaginal discharge, odor and itching x 1 month. The patient also complained of urinary frequency and pain during intercourse.

## 2016-04-12 LAB — CERVICOVAGINAL ANCILLARY ONLY
CHLAMYDIA, DNA PROBE: NEGATIVE
Neisseria Gonorrhea: NEGATIVE
Wet Prep (BD Affirm): POSITIVE — AB

## 2016-04-15 ENCOUNTER — Telehealth (HOSPITAL_COMMUNITY): Payer: Self-pay | Admitting: Internal Medicine

## 2016-04-15 NOTE — Telephone Encounter (Signed)
Clinical staff, please let patient know that test for trichomonas was positive.  If persistent vaginal itching/irritation/discharge, would send rx for metronidazole 2g po x 1 dose no refills.  Recheck for further evaluation if symptoms persist.  LM

## 2016-04-16 ENCOUNTER — Telehealth (HOSPITAL_COMMUNITY): Payer: Self-pay | Admitting: Emergency Medicine

## 2016-04-16 MED ORDER — METRONIDAZOLE 500 MG PO TABS: 2000.0000 mg | ORAL_TABLET | Freq: Once | ORAL | 0 refills | Status: AC

## 2016-04-16 NOTE — Telephone Encounter (Signed)
Called pt and notified of recent lab results  Pt ID'd properly... Reports feeling better but still having some vag d/c Per her request.... Called in Flagyl to [facility]) Adv pt if sx are not getting better to return or to f/u w/PCP Education on safe sex given Also adv pt to notify partner(s) Pt verb understanding.

## 2016-04-16 NOTE — Telephone Encounter (Signed)
Called pt and notified of recent lab results  Pt ID'd properly... Reports feeling better but still having some vag d/c Per her request.... Called in Flagyl to [facility] ([place]) Adv pt if sx are not getting better to return or to f/u w/PCP Education on safe sex given Also adv pt to notify partner(s) Pt verb understanding.      

## 2016-04-16 NOTE — Telephone Encounter (Signed)
-----   Message from Eustace MooreLaura W Murray, MD sent at 04/15/2016  8:03 PM EST ----- Clinical staff, please let patient know that test for trichomonas was positive.  If persistent vaginal itching/irritation/discharge, would send rx for metronidazole 2g po x 1 dose no refills.  Recheck for further evaluation if symptoms persist.  LM

## 2016-07-07 ENCOUNTER — Emergency Department (HOSPITAL_COMMUNITY): Payer: Self-pay

## 2016-07-07 ENCOUNTER — Emergency Department (HOSPITAL_COMMUNITY)
Admission: EM | Admit: 2016-07-07 | Discharge: 2016-07-08 | Disposition: A | Discharge status: Home / Self Care | Payer: Self-pay | Attending: Emergency Medicine | Admitting: Emergency Medicine

## 2016-07-07 ENCOUNTER — Encounter (HOSPITAL_COMMUNITY): Payer: Self-pay | Admitting: Emergency Medicine

## 2016-07-07 DIAGNOSIS — R0789 Other chest pain: Secondary | ICD-10-CM | POA: Insufficient documentation

## 2016-07-07 DIAGNOSIS — Z79899 Other long term (current) drug therapy: Secondary | ICD-10-CM | POA: Insufficient documentation

## 2016-07-07 DIAGNOSIS — Z87891 Personal history of nicotine dependence: Secondary | ICD-10-CM | POA: Insufficient documentation

## 2016-07-07 LAB — CBC
HEMATOCRIT: 39.2 % (ref 36.0–46.0)
HEMOGLOBIN: 13.1 g/dL (ref 12.0–15.0)
MCH: 28.6 pg (ref 26.0–34.0)
MCHC: 33.4 g/dL (ref 30.0–36.0)
MCV: 85.6 fL (ref 78.0–100.0)
Platelets: 201 10*3/uL (ref 150–400)
RBC: 4.58 MIL/uL (ref 3.87–5.11)
RDW: 13.4 % (ref 11.5–15.5)
WBC: 6.4 10*3/uL (ref 4.0–10.5)

## 2016-07-07 LAB — BASIC METABOLIC PANEL
Anion gap: 6 (ref 5–15)
BUN: 9 mg/dL (ref 6–20)
CO2: 27 mmol/L (ref 22–32)
Calcium: 9 mg/dL (ref 8.9–10.3)
Chloride: 106 mmol/L (ref 101–111)
Creatinine, Ser: 0.81 mg/dL (ref 0.44–1.00)
Glucose, Bld: 87 mg/dL (ref 65–99)
POTASSIUM: 3.4 mmol/L — AB (ref 3.5–5.1)
Sodium: 139 mmol/L (ref 135–145)

## 2016-07-07 LAB — I-STAT TROPONIN, ED: Troponin i, poc: 0.02 ng/mL (ref 0.00–0.08)

## 2016-07-07 MED ORDER — IOPAMIDOL (ISOVUE-370) INJECTION 76%
INTRAVENOUS | Status: AC
  Administered 2016-07-07: 100 mL via INTRAVENOUS
  Filled 2016-07-07: qty 100

## 2016-07-07 MED ORDER — METHOCARBAMOL 500 MG PO TABS
1000.0000 mg | ORAL_TABLET | Freq: Once | ORAL | Status: AC
  Administered 2016-07-08: 1000 mg via ORAL
  Filled 2016-07-07: qty 2

## 2016-07-07 MED ORDER — SODIUM CHLORIDE 0.9 % IV BOLUS (SEPSIS)
1000.0000 mL | Freq: Once | INTRAVENOUS | Status: AC
  Administered 2016-07-07: 1000 mL via INTRAVENOUS

## 2016-07-07 MED ORDER — SODIUM CHLORIDE 0.9 % IJ SOLN
INTRAMUSCULAR | Status: AC
  Filled 2016-07-07: qty 50

## 2016-07-07 MED ORDER — KETOROLAC TROMETHAMINE 30 MG/ML IJ SOLN
30.0000 mg | Freq: Once | INTRAMUSCULAR | Status: AC
  Administered 2016-07-07: 30 mg via INTRAVENOUS
  Filled 2016-07-07: qty 1

## 2016-07-07 NOTE — ED Notes (Signed)
Made one unsuccessful attempt to collect blood sample 

## 2016-07-07 NOTE — ED Triage Notes (Signed)
Pt c/o left sided chest pain and SOB onset about an hour ago with pain radiating to back. Pt c/o neck pain for 2 days. Lung sounds mostly clear, slightly diminished in lower lobes.

## 2016-07-07 NOTE — ED Provider Notes (Signed)
[facility] Provider Note   CSN: [id number] Arrival date & time: 07/07/16  2037     History   Chief Complaint Chief Complaint  Patient presents with  . Chest Pain  . Shortness of Breath    HPI Sally BasquesMichelle Reid is a 56 y.o. female.  HPI Patient presents with 2 days of left-sided chest pain radiating to the left side of posterior neck and left thoracic back. States the pain is worse with movement and deep breathing. Pain worsened this evening around 6:00. States she was sitting watching television. Describes the pain as sharp and associated with worsening shortness of breath. No recent fever chills. No cough. No extended travel or immobility. Patient has chronic left leg swelling compared to right. Denies any new swelling. No family history personal history of thromboembolic disease. Past Medical History:  Diagnosis Date  . Arthritis     Patient Active Problem List   Diagnosis Date Noted  . CHEST PAIN 04/21/2009  . ELEVATED BP READING WITHOUT DX HYPERTENSION 04/21/2009  . OBESITY 12/08/2008  . 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 BACK PAIN 12/08/2008    Past Surgical History:  Procedure Laterality Date  . ABDOMINAL HYSTERECTOMY     2005  . ANKLE FRACTURE SURGERY Left   . arthroscopy of left knee  2008   for cartilage surgery  . CESAREAN SECTION     2 previous    OB History    Gravida Para Term Preterm AB Living   3 2 2   1 2    SAB TAB Ectopic Multiple Live Births     1             Home Medications    Prior to Admission medications   Medication Sig Start Date End Date Taking? Authorizing Provider  naproxen sodium (ANAPROX) 220 MG tablet Take 440 mg by mouth once.   Yes Historical Provider, MD  HYDROcodone-acetaminophen (NORCO) 5-325 MG tablet Take 1 tablet by mouth every 6 (six) hours as needed for moderate pain. Patient not taking: Reported on 07/07/2016 11/28/15   Tatyana Kirichenko, PA-C  ibuprofen (ADVIL,MOTRIN) 600 MG tablet  Take 1 tablet (600 mg total) by mouth every 6 (six) hours as needed. 07/08/16   Loren Raceravid Kasson Lamere, MD  methocarbamol (ROBAXIN) 500 MG tablet Take 2 tablets (1,000 mg total) by mouth every 6 (six) hours as needed for muscle spasms. 07/08/16   Loren Raceravid Pierson Vantol, MD  naproxen (NAPROSYN) 500 MG tablet Take 1 tablet (500 mg total) by mouth 2 (two) times daily. Patient not taking: Reported on 07/07/2016 11/28/15   Jaynie Crumbleatyana Kirichenko, PA-C    Family History Family History  Problem Relation Age of Onset  . Diabetes Mother   . Hypertension Mother     Social History Social History  Substance Use Topics  . Smoking status: Former Smoker    Packs/day: 0.25  . Smokeless tobacco: Never Used  . Alcohol use Yes     Comment: occassionally     Allergies   Patient has no known allergies.   Review of Systems Review of Systems  Constitutional: Negative for chills and fever.  HENT: Negative for congestion, sinus pressure and sore throat.   Respiratory: Positive for shortness of breath. Negative for cough.   Cardiovascular: Positive for chest pain and leg swelling. Negative for palpitations.  Gastrointestinal: Negative for abdominal pain, diarrhea, nausea and vomiting.  Genitourinary: Negative for difficulty urinating, flank pain, frequency and hematuria.  Musculoskeletal: Positive for back pain, myalgias  and neck pain. Negative for arthralgias, gait problem and neck stiffness.  Skin: Negative for rash and wound.  Neurological: Negative for dizziness, weakness, light-headedness, numbness and headaches.  All other systems reviewed and are negative.    Physical Exam Updated Vital Signs BP 174/71 (BP Location: Right Arm)   Pulse 67   Temp 98 F (36.7 C) (Oral)   Resp 19   SpO2 98%   Physical Exam  Constitutional: She is oriented to person, place, and time. She appears well-developed and well-nourished.  HENT:  Head: Normocephalic and atraumatic.  Mouth/Throat: Oropharynx is clear and moist. No  oropharyngeal exudate.  Eyes: EOM are normal. Pupils are equal, round, and reactive to light.  Neck: Normal range of motion. Neck supple.  No meningismus. Pain is reproduced with palpation of the left trapezius.   Cardiovascular: Normal rate and regular rhythm.  Exam reveals no gallop and no friction rub.   No murmur heard. Pulmonary/Chest: Effort normal and breath sounds normal. No respiratory distress. She has no wheezes. She has no rales. She exhibits tenderness (left-sided chest tendernessto palpation. No crepitance or deformity.).  Abdominal: Soft. Bowel sounds are normal. There is no tenderness. There is no rebound and no guarding.  Musculoskeletal: Normal range of motion. She exhibits edema. She exhibits no tenderness.  Left greater than right lower extremity swelling. Mild tenderness over the left calf. Distal pulses intact.  Lymphadenopathy:    She has no cervical adenopathy.  Neurological: She is alert and oriented to person, place, and time.  Skin: Skin is warm and dry. Capillary refill takes less than 2 seconds. No rash noted. No erythema.  Psychiatric: She has a normal mood and affect. Her behavior is normal.  Nursing note and vitals reviewed.    ED Treatments / Results  Labs (all labs ordered are listed, but only abnormal results are displayed) Labs Reviewed  BASIC METABOLIC PANEL - Abnormal; Notable for the following:       Result Value   Potassium 3.4 (*)    All other components within normal limits  CBC  I-STAT TROPOININ, ED  Rosezena Sensor, ED    EKG  EKG Interpretation  Date/Time:  Saturday July 07 2016 20:44:28 EST Ventricular Rate:  79 PR Interval:    QRS Duration: 80 QT Interval:  388 QTC Calculation: 445 R Axis:   46 Text Interpretation:  Sinus rhythm Confirmed by Ranae Palms  MD, Markelle Najarian ([id number]) on 07/07/2016 10:00:37 PM Also confirmed by Ranae Palms  MD, Kenzlee Fishburn ([id number]), editor [place]  CCT, BEVERLY ([id number])  on 07/08/2016 8:28:35 AM        Radiology Dg Chest 2 View  Result Date: 07/07/2016 CLINICAL DATA:  C/o sob, left side cp, left arm pain, left neck pain x 2 hours. Former smoker EXAM: CHEST  2 VIEW COMPARISON:  Chest x-ray dated 12/10/2010. FINDINGS: The heart size and mediastinal contours are within normal limits. Both lungs are clear. The visualized skeletal structures are unremarkable. IMPRESSION: No active cardiopulmonary disease. Electronically Signed   By: Bary Richard M.D.   On: 07/07/2016 21:33   Ct Angio Chest Pe W And/or Wo Contrast  Result Date: 07/07/2016 CLINICAL DATA:  Left-sided chest pain and dyspnea, onset 1 hour prior to arrival. EXAM: CT ANGIOGRAPHY CHEST WITH CONTRAST TECHNIQUE: Multidetector CT imaging of the chest was performed using the standard protocol during bolus administration of intravenous contrast. Multiplanar CT image reconstructions and MIPs were obtained to evaluate the vascular anatomy. CONTRAST:  100 mL Isovue 370 intravenous COMPARISON:  Radiographs  07/07/2016 FINDINGS: Cardiovascular: Satisfactory opacification of the pulmonary arteries to the segmental level. No evidence of pulmonary embolism. Normal heart size. No pericardial effusion. Mediastinum/Nodes: No enlarged mediastinal, hilar, or axillary lymph nodes. Thyroid gland, trachea, and esophagus demonstrate no significant findings. Lungs/Pleura: Lungs are clear. No pleural effusion or pneumothorax. Upper Abdomen: No acute abnormality. Musculoskeletal: No chest wall abnormality. No acute or significant osseous findings. Review of the MIP images confirms the above findings. IMPRESSION: Negative for pulmonary embolism.  No significant abnormality. Electronically Signed   By: Ellery Plunk M.D.   On: 07/07/2016 23:15    Procedures Procedures (including critical care time)  Medications Ordered in ED Medications  sodium chloride 0.9 % bolus 1,000 mL (0 mLs Intravenous Stopped 07/08/16 0011)  ketorolac (TORADOL) 30 MG/ML injection 30 mg (30  mg Intravenous Given 07/07/16 2240)  iopamidol (ISOVUE-370) 76 % injection (100 mLs Intravenous Contrast Given 07/07/16 2257)  methocarbamol (ROBAXIN) tablet 1,000 mg (1,000 mg Oral Given 07/08/16 0011)     Initial Impression / Assessment and Plan / ED Course  I have reviewed the triage vital signs and the nursing notes.  Pertinent labs & imaging results that were available during my care of the patient were reviewed by me and considered in my medical decision making (see chart for details).    CT injury chest without evidence of PE. Initial troponin is normal. EKG without evidence of ischemia. Suspect symptoms are likely musculoskeletal in nature. Signed out to oncoming emergency physician pending repeat troponin. Patient given Toradol and muscle relaxer with improvement of discomfort.  Final Clinical Impressions(s) / ED Diagnoses   Final diagnoses:  Left-sided chest wall pain    New Prescriptions Discharge Medication List as of 07/08/2016  3:40 AM    START taking these medications   Details  ibuprofen (ADVIL,MOTRIN) 600 MG tablet Take 1 tablet (600 mg total) by mouth every 6 (six) hours as needed., Starting Sun 07/08/2016, Print         Loren Racer, MD 07/08/16 [phone]

## 2016-07-08 LAB — I-STAT TROPONIN, ED: TROPONIN I, POC: 0 ng/mL (ref 0.00–0.08)

## 2016-07-08 MED ORDER — METHOCARBAMOL 500 MG PO TABS: 1000.0000 mg | ORAL_TABLET | Freq: Four times a day (QID) | ORAL | 0 refills | Status: DC | PRN

## 2016-07-08 MED ORDER — IBUPROFEN 600 MG PO TABS: 600.0000 mg | ORAL_TABLET | Freq: Four times a day (QID) | ORAL | 0 refills | Status: DC | PRN

## 2016-07-08 NOTE — ED Provider Notes (Signed)
Second troponin is negative. Patient feels well. Will discharge home per prior plan with Dr. Ranae PalmsYelverton. Discussed return precautions   Pricilla LovelessScott Shaqueena Mauceri, MD 07/08/16 [phone]

## 2017-02-13 DIAGNOSIS — Z0000 Encounter for general adult medical examination without abnormal findings: Secondary | ICD-10-CM | POA: Diagnosis not present

## 2017-02-13 DIAGNOSIS — M545 Low back pain: Secondary | ICD-10-CM | POA: Diagnosis not present

## 2017-02-13 DIAGNOSIS — Z13220 Encounter for screening for lipoid disorders: Secondary | ICD-10-CM | POA: Diagnosis not present

## 2017-02-13 DIAGNOSIS — I10 Essential (primary) hypertension: Secondary | ICD-10-CM | POA: Diagnosis not present

## 2017-02-13 DIAGNOSIS — Z720 Tobacco use: Secondary | ICD-10-CM | POA: Diagnosis not present

## 2017-02-20 DIAGNOSIS — Z1231 Encounter for screening mammogram for malignant neoplasm of breast: Secondary | ICD-10-CM | POA: Diagnosis not present

## 2017-02-21 DIAGNOSIS — M1712 Unilateral primary osteoarthritis, left knee: Secondary | ICD-10-CM | POA: Diagnosis not present

## 2017-02-21 DIAGNOSIS — M19072 Primary osteoarthritis, left ankle and foot: Secondary | ICD-10-CM | POA: Diagnosis not present

## 2017-04-12 ENCOUNTER — Encounter (HOSPITAL_COMMUNITY): Payer: Self-pay

## 2017-06-28 ENCOUNTER — Ambulatory Visit: Payer: Self-pay | Admitting: Podiatry

## 2017-08-29 ENCOUNTER — Encounter (HOSPITAL_COMMUNITY): Payer: Self-pay

## 2017-08-29 ENCOUNTER — Emergency Department (HOSPITAL_COMMUNITY): Payer: BLUE CROSS/BLUE SHIELD

## 2017-08-29 ENCOUNTER — Emergency Department (HOSPITAL_COMMUNITY)
Admission: EM | Admit: 2017-08-29 | Discharge: 2017-08-29 | Disposition: A | Discharge status: Home / Self Care | Payer: BLUE CROSS/BLUE SHIELD | Attending: Emergency Medicine | Admitting: Emergency Medicine

## 2017-08-29 ENCOUNTER — Other Ambulatory Visit: Payer: Self-pay

## 2017-08-29 DIAGNOSIS — R0602 Shortness of breath: Secondary | ICD-10-CM | POA: Diagnosis not present

## 2017-08-29 DIAGNOSIS — R079 Chest pain, unspecified: Secondary | ICD-10-CM | POA: Diagnosis not present

## 2017-08-29 DIAGNOSIS — I10 Essential (primary) hypertension: Secondary | ICD-10-CM | POA: Diagnosis not present

## 2017-08-29 DIAGNOSIS — R0789 Other chest pain: Secondary | ICD-10-CM

## 2017-08-29 DIAGNOSIS — Z79899 Other long term (current) drug therapy: Secondary | ICD-10-CM | POA: Insufficient documentation

## 2017-08-29 HISTORY — DX: Essential (primary) hypertension: I10

## 2017-08-29 LAB — BASIC METABOLIC PANEL
Anion gap: 6 (ref 5–15)
BUN: 13 mg/dL (ref 6–20)
CHLORIDE: 108 mmol/L (ref 101–111)
CO2: 28 mmol/L (ref 22–32)
CREATININE: 0.82 mg/dL (ref 0.44–1.00)
Calcium: 9.5 mg/dL (ref 8.9–10.3)
Glucose, Bld: 86 mg/dL (ref 65–99)
POTASSIUM: 3.9 mmol/L (ref 3.5–5.1)
SODIUM: 142 mmol/L (ref 135–145)

## 2017-08-29 LAB — CBC
HCT: 43.4 % (ref 36.0–46.0)
Hemoglobin: 14.7 g/dL (ref 12.0–15.0)
MCH: 29.4 pg (ref 26.0–34.0)
MCHC: 33.9 g/dL (ref 30.0–36.0)
MCV: 86.8 fL (ref 78.0–100.0)
PLATELETS: 222 10*3/uL (ref 150–400)
RBC: 5 MIL/uL (ref 3.87–5.11)
RDW: 13.7 % (ref 11.5–15.5)
WBC: 6.9 10*3/uL (ref 4.0–10.5)

## 2017-08-29 LAB — I-STAT TROPONIN, ED
TROPONIN I, POC: 0.02 ng/mL (ref 0.00–0.08)
Troponin i, poc: 0.01 ng/mL (ref 0.00–0.08)

## 2017-08-29 NOTE — ED Notes (Signed)
ED Provider at bedside. 

## 2017-08-29 NOTE — Discharge Instructions (Signed)
Take 4 over the counter ibuprofen tablets 3 times a day or 2 over-the-counter naproxen tablets twice a day for pain. Also take tylenol 1000mg (2 extra strength) four times a day.    No signs of heart damage on your labs.  This means this is very unlikely to be a heart attack. Follow up with your PCP. Return for worsening of symptoms.

## 2017-08-29 NOTE — ED Provider Notes (Signed)
[facility]-EMERGENCY DEPT Provider Note   CSN: [id number] Arrival date & time: 08/29/17  1308     History   Chief Complaint Chief Complaint  Patient presents with  . Chest Pain    HPI [place] is a 57 y.o. female.  57 yo  F with a chief complaint of chest pain.  Started about 4 hours ago.  Started at rest.  She does not notice anything that makes it better or worse.  Has had some mild shortness of breath felt some mild pain unrelated to her right arm.  She also felt that she may pass out.  All the symptoms seem to occur at different times.  Complains of bilateral lower extremity edema.  Denies hemoptysis denies history of PE or DVT denies estrogen use denies recent surgery or hospitalization denies prolonged travel.  Denies history of MI.  Has a history of hypertension hyperlipidemia smokes occasionally.  Denies diabetes denies family history of MI.  The history is provided by the patient.  Chest Pain   This is a new problem. The current episode started 3 to 5 hours ago. The problem occurs constantly. The problem has not changed since onset.The pain is present in the substernal region. The pain is at a severity of 3/10. Associated symptoms include shortness of breath. Pertinent negatives include no dizziness, no fever, no headaches, no nausea, no palpitations and no vomiting.    Past Medical History:  Diagnosis Date  . Arthritis   . Hypertension     Patient Active Problem List   Diagnosis Date Noted  . CHEST PAIN 04/21/2009  . ELEVATED BP READING WITHOUT DX HYPERTENSION 04/21/2009  . OBESITY 12/08/2008  . 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 BACK PAIN 12/08/2008    Past Surgical History:  Procedure Laterality Date  . ABDOMINAL HYSTERECTOMY     2005  . ANKLE FRACTURE SURGERY Left   . arthroscopy of left knee  2008   for cartilage surgery  . CESAREAN SECTION     2 previous  . LEG SURGERY       OB History    Gravida  3   Para  2   Term  2   Preterm      AB  1   Living  2     SAB      TAB  1   Ectopic      Multiple      Live Births               Home Medications    Prior to Admission medications   Medication Sig Start Date End Date Taking? Authorizing Provider  hydrochlorothiazide (MICROZIDE) 12.5 MG capsule Take 12.5 mg by mouth daily.   Yes [provider]  naproxen sodium (ANAPROX) 220 MG tablet Take 440 mg by mouth once.   Yes [provider]  HYDROcodone-acetaminophen (NORCO) 5-325 MG tablet Take 1 tablet by mouth every 6 (six) hours as needed for moderate pain. Patient not taking: Reported on 07/07/2016 11/28/15   Jaynie CrumbleKirichenko, Tatyana, PA-C  ibuprofen (ADVIL,MOTRIN) 600 MG tablet Take 1 tablet (600 mg total) by mouth every 6 (six) hours as needed. Patient not taking: Reported on 08/29/2017 07/08/16   Loren RacerYelverton, David, MD  methocarbamol (ROBAXIN) 500 MG tablet Take 2 tablets (1,000 mg total) by mouth every 6 (six) hours as needed for muscle spasms. Patient not taking: Reported on 08/29/2017 07/08/16   Loren RacerYelverton, David, MD  naproxen (NAPROSYN)  500 MG tablet Take 1 tablet (500 mg total) by mouth 2 (two) times daily. Patient not taking: Reported on 07/07/2016 11/28/15   Jaynie Crumble, PA-C    Family History Family History  Problem Relation Age of Onset  . Diabetes Mother   . Hypertension Mother     Social History Social History   Tobacco Use  . Smoking status: Former Smoker    Packs/day: 0.25  . Smokeless tobacco: Never Used  Substance Use Topics  . Alcohol use: Yes    Comment: occassionally  . Drug use: No     Allergies   Patient has no known allergies.   Review of Systems Review of Systems  Constitutional: Negative for chills and fever.  HENT: Negative for congestion and rhinorrhea.   Eyes: Negative for redness and visual disturbance.  Respiratory: Positive for shortness of breath. Negative for wheezing.   Cardiovascular: Positive  for chest pain. Negative for palpitations.  Gastrointestinal: Negative for nausea and vomiting.  Genitourinary: Negative for dysuria and urgency.  Musculoskeletal: Positive for myalgias. Negative for arthralgias.  Skin: Negative for pallor and wound.  Neurological: Negative for dizziness and headaches.     Physical Exam Updated Vital Signs BP (!) 157/99   Pulse 64   Temp 98.5 F (36.9 C) (Oral)   Resp (!) 21   Ht 4\' 11"  (1.499 m)   Wt 107.5 kg (237 lb)   SpO2 99%   BMI 47.87 kg/m   Physical Exam  Constitutional: She is oriented to person, place, and time. She appears well-developed and well-nourished. No distress.  obese  HENT:  Head: Normocephalic and atraumatic.  Eyes: Pupils are equal, round, and reactive to light. EOM are normal.  Neck: Normal range of motion. Neck supple.  Cardiovascular: Normal rate and regular rhythm. Exam reveals no gallop and no friction rub.  No murmur heard. Pulmonary/Chest: Effort normal. She has no wheezes. She has no rales. She exhibits tenderness (reproduces her pain with palpation of the sternum).  Abdominal: Soft. She exhibits no distension. There is no tenderness.  Musculoskeletal: She exhibits no edema or tenderness.  Two small bruises to the anterior aspect of the leg, tender to palpation.    Neurological: She is alert and oriented to person, place, and time.  Skin: Skin is warm and dry. She is not diaphoretic.  Psychiatric: She has a normal mood and affect. Her behavior is normal.  Nursing note and vitals reviewed.    ED Treatments / Results  Labs (all labs ordered are listed, but only abnormal results are displayed) Labs Reviewed  BASIC METABOLIC PANEL  CBC  I-STAT TROPONIN, ED  I-STAT TROPONIN, ED    EKG EKG Interpretation  Date/Time:  Thursday August 29 2017 13:18:54 EDT Ventricular Rate:  92 PR Interval:    QRS Duration: 81 QT Interval:  381 QTC Calculation: 472 R Axis:   55 Text Interpretation:  Sinus rhythm  flipped t wave in lead III, seen on prior Otherwise no significant change Confirmed by Melene Plan [phone]) on 08/29/2017 3:21:01 PM   Radiology Dg Chest 2 View  Result Date: 08/29/2017 CLINICAL DATA:  Chest pain EXAM: CHEST - 2 VIEW COMPARISON:  07/07/2016 FINDINGS: The heart size and mediastinal contours are within normal limits. Both lungs are clear. The visualized skeletal structures are unremarkable. IMPRESSION: No active cardiopulmonary disease. Electronically Signed   By: Jasmine Pang M.D.   On: 08/29/2017 13:56    Procedures Procedures (including critical care time)  Medications Ordered in ED Medications -  No data to display   Initial Impression / Assessment and Plan / ED Course  I have reviewed the triage vital signs and the nursing notes.  Pertinent labs & imaging results that were available during my care of the patient were reviewed by me and considered in my medical decision making (see chart for details).     57 yo F with a chief complaint chest pain.  Most likely musculoskeletal on history and physical.  Pain is reproduced on exam.  The patient was concerned about some bruises that have not gone away from her left lower extremity she feels that she must have a blood clot.  Her legs are equal and I do not appreciate any edema.  The bruises appear new and are tender to palpation.  I suggested she follow-up with her PCP for that.  Will obtain a delta troponin.  Delta negative.  D/c home.   5:03 PM:  I have discussed the diagnosis/risks/treatment options with the patient and family and believe the pt to be eligible for discharge home to follow-up with PCP. We also discussed returning to the ED immediately if new or worsening sx occur. We discussed the sx which are most concerning (e.g., sudden worsening pain, fever, inability to tolerate by mouth) that necessitate immediate return. Medications administered to the patient during their visit and any new prescriptions provided to the  patient are listed below.  Medications given during this visit Medications - No data to display   The patient appears reasonably screen and/or stabilized for discharge and I doubt any other medical condition or other [facility] requiring further screening, evaluation, or treatment in the ED at this time prior to discharge.    Final Clinical Impressions(s) / ED Diagnoses   Final diagnoses:  Chest wall pain    ED Discharge Orders    None       Melene Plan, DO 08/29/17 1703

## 2017-08-29 NOTE — ED Notes (Signed)
Pt had drawn for labs:  Red Blue Gold Lavender Lt green Dark green

## 2017-08-29 NOTE — ED Triage Notes (Signed)
Patient c/o mid chest pain, near syncope,  and SOB x 1 hour ago while at work.. Patient also c/o right arm pain since 1000 today.

## 2017-10-04 DIAGNOSIS — R079 Chest pain, unspecified: Secondary | ICD-10-CM | POA: Diagnosis not present

## 2017-10-04 DIAGNOSIS — Z720 Tobacco use: Secondary | ICD-10-CM | POA: Diagnosis not present

## 2017-10-04 DIAGNOSIS — R1031 Right lower quadrant pain: Secondary | ICD-10-CM | POA: Diagnosis not present

## 2017-10-04 DIAGNOSIS — Z1211 Encounter for screening for malignant neoplasm of colon: Secondary | ICD-10-CM | POA: Diagnosis not present

## 2017-10-04 DIAGNOSIS — I10 Essential (primary) hypertension: Secondary | ICD-10-CM | POA: Diagnosis not present

## 2017-10-07 ENCOUNTER — Telehealth: Payer: Self-pay | Admitting: *Deleted

## 2017-10-07 NOTE — Telephone Encounter (Signed)
Referral sent to scheduling from Levonne Lapping, NP, [facility] [place], [phone].

## 2017-10-18 ENCOUNTER — Other Ambulatory Visit (HOSPITAL_COMMUNITY): Payer: Self-pay | Admitting: Nurse Practitioner

## 2017-10-18 DIAGNOSIS — R079 Chest pain, unspecified: Secondary | ICD-10-CM

## 2017-10-21 ENCOUNTER — Telehealth (HOSPITAL_COMMUNITY): Payer: Self-pay | Admitting: *Deleted

## 2017-10-21 NOTE — Telephone Encounter (Signed)
Patient given detailed instructions per Myocardial Perfusion Study Information Sheet for the test on 10/24/17 at 1230. Patient notified to arrive 15 minutes early and that it is imperative to arrive on time for appointment to keep from having the test rescheduled.  If you need to cancel or reschedule your appointment, please call the office within 24 hours of your appointment. . Patient verbalized understanding.[name], Adelene Idler

## 2017-10-24 ENCOUNTER — Ambulatory Visit (HOSPITAL_COMMUNITY): Payer: BLUE CROSS/BLUE SHIELD | Attending: Cardiology

## 2017-10-24 DIAGNOSIS — R079 Chest pain, unspecified: Secondary | ICD-10-CM | POA: Diagnosis not present

## 2017-10-24 MED ORDER — TECHNETIUM TC 99M TETROFOSMIN IV KIT
32.6000 | PACK | Freq: Once | INTRAVENOUS | Status: AC | PRN
  Administered 2017-10-24: 32.6 via INTRAVENOUS
  Filled 2017-10-24: qty 33

## 2017-10-24 MED ORDER — REGADENOSON 0.4 MG/5ML IV SOLN
0.4000 mg | Freq: Once | INTRAVENOUS | Status: AC
  Administered 2017-10-24: 0.4 mg via INTRAVENOUS

## 2017-10-25 ENCOUNTER — Ambulatory Visit (HOSPITAL_COMMUNITY): Payer: BLUE CROSS/BLUE SHIELD | Attending: Cardiovascular Disease

## 2017-10-25 LAB — MYOCARDIAL PERFUSION IMAGING
CHL CUP NUCLEAR SDS: 1
CHL CUP NUCLEAR SSS: 6
CSEPPHR: 98 {beats}/min
LHR: 0.35
LV dias vol: 71 mL (ref 46–106)
LVSYSVOL: 26 mL
Rest HR: 70 {beats}/min
SRS: 5
TID: 0.97

## 2017-10-25 MED ORDER — TECHNETIUM TC 99M TETROFOSMIN IV KIT
32.1000 | PACK | Freq: Once | INTRAVENOUS | Status: AC | PRN
  Administered 2017-10-25: 32.1 via INTRAVENOUS
  Filled 2017-10-25: qty 33

## 2017-11-05 ENCOUNTER — Other Ambulatory Visit: Payer: Self-pay | Admitting: Nurse Practitioner

## 2017-11-05 DIAGNOSIS — R300 Dysuria: Secondary | ICD-10-CM | POA: Diagnosis not present

## 2017-11-05 DIAGNOSIS — R1031 Right lower quadrant pain: Secondary | ICD-10-CM | POA: Diagnosis not present

## 2017-11-08 ENCOUNTER — Ambulatory Visit
Admission: RE | Admit: 2017-11-08 | Discharge: 2017-11-08 | Disposition: A | Discharge status: Home / Self Care | Payer: BLUE CROSS/BLUE SHIELD | Source: Ambulatory Visit | Attending: Nurse Practitioner | Admitting: Nurse Practitioner

## 2017-11-08 DIAGNOSIS — R1031 Right lower quadrant pain: Secondary | ICD-10-CM

## 2017-11-14 DIAGNOSIS — R1011 Right upper quadrant pain: Secondary | ICD-10-CM | POA: Diagnosis not present

## 2017-11-15 ENCOUNTER — Other Ambulatory Visit: Payer: Self-pay | Admitting: Gastroenterology

## 2017-11-15 DIAGNOSIS — R1011 Right upper quadrant pain: Secondary | ICD-10-CM

## 2017-11-25 ENCOUNTER — Ambulatory Visit
Admission: RE | Admit: 2017-11-25 | Discharge: 2017-11-25 | Disposition: A | Discharge status: Home / Self Care | Payer: BLUE CROSS/BLUE SHIELD | Source: Ambulatory Visit | Attending: Gastroenterology | Admitting: Gastroenterology

## 2017-11-25 DIAGNOSIS — R1011 Right upper quadrant pain: Secondary | ICD-10-CM

## 2017-11-28 ENCOUNTER — Other Ambulatory Visit: Payer: Self-pay | Admitting: Gastroenterology

## 2017-11-28 DIAGNOSIS — K5730 Diverticulosis of large intestine without perforation or abscess without bleeding: Secondary | ICD-10-CM | POA: Diagnosis not present

## 2017-11-28 DIAGNOSIS — R109 Unspecified abdominal pain: Secondary | ICD-10-CM

## 2017-11-28 DIAGNOSIS — Z1211 Encounter for screening for malignant neoplasm of colon: Secondary | ICD-10-CM | POA: Diagnosis not present

## 2017-12-03 ENCOUNTER — Ambulatory Visit: Payer: BLUE CROSS/BLUE SHIELD | Admitting: Interventional Cardiology

## 2017-12-03 DIAGNOSIS — R1312 Dysphagia, oropharyngeal phase: Secondary | ICD-10-CM | POA: Diagnosis not present

## 2017-12-03 DIAGNOSIS — I10 Essential (primary) hypertension: Secondary | ICD-10-CM | POA: Diagnosis not present

## 2017-12-03 DIAGNOSIS — R1031 Right lower quadrant pain: Secondary | ICD-10-CM | POA: Diagnosis not present

## 2017-12-03 DIAGNOSIS — Z720 Tobacco use: Secondary | ICD-10-CM | POA: Diagnosis not present

## 2017-12-06 ENCOUNTER — Ambulatory Visit
Admission: RE | Admit: 2017-12-06 | Discharge: 2017-12-06 | Disposition: A | Discharge status: Home / Self Care | Payer: BLUE CROSS/BLUE SHIELD | Source: Ambulatory Visit | Attending: Gastroenterology | Admitting: Gastroenterology

## 2017-12-06 DIAGNOSIS — R109 Unspecified abdominal pain: Secondary | ICD-10-CM

## 2017-12-06 DIAGNOSIS — K429 Umbilical hernia without obstruction or gangrene: Secondary | ICD-10-CM | POA: Diagnosis not present

## 2017-12-06 MED ORDER — IOPAMIDOL (ISOVUE-300) INJECTION 61%
100.0000 mL | Freq: Once | INTRAVENOUS | Status: AC | PRN
  Administered 2017-12-06: 100 mL via INTRAVENOUS

## 2017-12-09 DIAGNOSIS — R1312 Dysphagia, oropharyngeal phase: Secondary | ICD-10-CM | POA: Diagnosis not present

## 2017-12-10 ENCOUNTER — Other Ambulatory Visit: Payer: Self-pay | Admitting: Nurse Practitioner

## 2017-12-10 DIAGNOSIS — R1312 Dysphagia, oropharyngeal phase: Secondary | ICD-10-CM

## 2017-12-10 DIAGNOSIS — R300 Dysuria: Secondary | ICD-10-CM | POA: Diagnosis not present

## 2017-12-17 ENCOUNTER — Other Ambulatory Visit: Payer: BLUE CROSS/BLUE SHIELD

## 2017-12-25 ENCOUNTER — Other Ambulatory Visit: Payer: BLUE CROSS/BLUE SHIELD

## 2017-12-26 DIAGNOSIS — M6289 Other specified disorders of muscle: Secondary | ICD-10-CM | POA: Diagnosis not present

## 2017-12-26 DIAGNOSIS — M62838 Other muscle spasm: Secondary | ICD-10-CM | POA: Diagnosis not present

## 2017-12-26 DIAGNOSIS — R300 Dysuria: Secondary | ICD-10-CM | POA: Diagnosis not present

## 2018-01-23 ENCOUNTER — Ambulatory Visit: Payer: BLUE CROSS/BLUE SHIELD | Admitting: Physician Assistant

## 2018-02-06 ENCOUNTER — Other Ambulatory Visit: Payer: Self-pay

## 2018-02-06 ENCOUNTER — Ambulatory Visit (HOSPITAL_COMMUNITY)
Admission: EM | Admit: 2018-02-06 | Discharge: 2018-02-06 | Disposition: A | Discharge status: Home / Self Care | Payer: BLUE CROSS/BLUE SHIELD | Attending: Family Medicine | Admitting: Family Medicine

## 2018-02-06 ENCOUNTER — Encounter (HOSPITAL_COMMUNITY): Payer: Self-pay | Admitting: *Deleted

## 2018-02-06 DIAGNOSIS — M7711 Lateral epicondylitis, right elbow: Secondary | ICD-10-CM

## 2018-02-06 MED ORDER — NAPROXEN 500 MG PO TABS: 500.0000 mg | ORAL_TABLET | Freq: Two times a day (BID) | ORAL | 0 refills | Status: DC

## 2018-02-06 NOTE — ED Triage Notes (Signed)
C/o pain in right arm onset 3 weeks ago, pain is worse with movement. C/o tingling in fingers today. Denies injury

## 2018-02-17 NOTE — ED Provider Notes (Signed)
[facility]   [id number] 02/06/18 Arrival Time: 1539  ASSESSMENT & PLAN:  1. Lateral epicondylitis of right elbow    Meds ordered this encounter  Medications  . naproxen (NAPROSYN) 500 MG tablet    Sig: Take 1 tablet (500 mg total) by mouth 2 (two) times daily with a meal.    Dispense:  14 tablet    Refill:  0   Tennis elbow band applied.  Follow-up Information    Levonne Lappingann, Samandra, NP In 1 week.   Specialty:  Nurse Practitioner Why:  If not improving. Contact information: [address] [place] [address] [phone]          Reviewed expectations re: course of current medical issues. Questions answered. Outlined signs and symptoms indicating need for more acute intervention. Patient verbalized understanding. After Visit Summary given.  SUBJECTIVE: History from: patient. Carlyle BasquesMichelle Boulay is a 57 y.o. female who reports intermittent moderate pain of her right arm/elbow that has progressed to a point and plateaued since beginning; described as aching without radiation. Onset: gradual, over the past 3 weeks. Injury/trama: no; does report repetitive use of her arms daily Relieved by: rest. Worsened by: movement. Associated symptoms: none reported. Extremity sensation changes or weakness: none. Self treatment: has not tried OTCs for relief of pain. History of similar: no  ROS: As per HPI.   OBJECTIVE:  Vitals:   02/06/18 1619  BP: (!) 141/76  Pulse: 63  Resp: 16  Temp: 97.9 F (36.6 C)  TempSrc: Oral  SpO2: 100%    General appearance: alert; no distress Extremities: warm and well perfused; symmetrical with no gross deformities; localized tenderness over her right lateral epicondyle with no swelling and no bruising; ROM around area or areas of discomfort: normal but with some discomfort CV: brisk extremity capillary refill Skin: warm and dry Neurologic: normal gait; normal symmetric reflexes in all extremities; normal sensation in  all extremities Psychological: alert and cooperative; normal mood and affect  No Known Allergies  Past Medical History:  Diagnosis Date  . Arthritis   . Hypertension    Social History   Socioeconomic History  . Marital status: Married    Spouse name: Not on file  . Number of children: Not on file  . Years of education: Not on file  . Highest education level: Not on file  Occupational History  . 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 Financial resource strain: Not on file  . Food insecurity:    Worry: Not on file    Inability: Not on file  . Transportation needs:    Medical: Not on file    Non-medical: Not on file  Tobacco Use  . Smoking status: Former Smoker    Packs/day: 0.25  . Smokeless tobacco: Never Used  Substance and Sexual Activity  . Alcohol use: Yes    Comment: occassionally  . Drug use: No  . Sexual activity: Yes    Birth control/protection: Surgical  Lifestyle  . Physical activity:    Days per week: Not on file    Minutes per session: Not on file  . Stress: Not on file  Relationships  . Social connections:    Talks on phone: Not on file    Gets together: Not on file    Attends religious service: Not on file    Active member of club or organization: Not on file    Attends meetings of clubs or organizations: Not  on file    Relationship status: Not on file  Other Topics Concern  . Not on file  Social History Narrative   Lives in [place].   Had 2 children( two boys 20 and 29).   Living with fiancee.   Family History  Problem Relation Age of Onset  . Diabetes Mother   . Hypertension Mother    Past Surgical History:  Procedure Laterality Date  . ABDOMINAL HYSTERECTOMY     2005  . ANKLE FRACTURE SURGERY Left   . arthroscopy of left knee  2008   for cartilage surgery  . CESAREAN SECTION     2 previous  . LEG SURGERY        Mardella Layman, MD 02/17/18 [phone]

## 2018-03-17 DIAGNOSIS — Z1231 Encounter for screening mammogram for malignant neoplasm of breast: Secondary | ICD-10-CM | POA: Diagnosis not present

## 2018-03-18 NOTE — Progress Notes (Signed)
[place] [address] [name], [place] [address] Phone: [phone] Subjective:   [place], am serving as a scribe for Dr. Antoine Reid.  I'm seeing this patient by the request  of:  Sally Lapping, NP   CC: Right elbow pain  [id number]  S[place] is a 57 y.o. female coming in with complaint of right arm pain. Pain for past 6 months. Pain over lateral epicondyle. 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 Patient has increase in pain at night. Is using IBU to alleviate her pain. Was given brace to wear and states that this did not help.  Rates the severity of pain sometimes is 8 out of 10.  Sometimes is affecting daily activities and waking her up at night.    Past Medical History:  Diagnosis Date  . Arthritis   . Hypertension    Past Surgical History:  Procedure Laterality Date  . ABDOMINAL HYSTERECTOMY     2005  . ANKLE FRACTURE SURGERY Left   . arthroscopy of left knee  2008   for cartilage surgery  . CESAREAN SECTION     2 previous  . LEG SURGERY     Social History   Socioeconomic History  . Marital status: Married    Spouse name: Not on file  . Number of children: Not on file  . Years of education: Not on file  . Highest education level: Not on file  Occupational History  . Occupation: unemployed    Comment: without Programmer, applications  Social Needs  . Financial resource strain: Not on file  . Food insecurity:    Worry: Not on file    Inability: Not on file  . Transportation needs:    Medical: Not on file    Non-medical: Not on file  Tobacco Use  . Smoking status: Former Smoker    Packs/day: 0.25  . Smokeless tobacco: Never Used  Substance and Sexual Activity  . Alcohol use: Yes    Comment: occassionally  . Drug use: No  . Sexual activity: Yes    Birth control/protection: Surgical  Lifestyle  . Physical activity:    Days per week: Not  on file    Minutes per session: Not on file  . Stress: Not on file  Relationships  . Social connections:    Talks on phone: Not on file    Gets together: Not on file    Attends religious service: Not on file    Active member of club or organization: Not on file    Attends meetings of clubs or organizations: Not on file    Relationship status: Not on file  Other Topics Concern  . Not on file  Social History Narrative   [place]ives in [place].   Had 2 children( two boys 66 and 29).   Living with fiancee.   No Known Allergies Family History  Problem Relation Age of Onset  . Diabetes Mother   . Hypertension Mother      Current Outpatient Medications (Cardiovascular):  .  hydrochlorothiazide (MICROZIDE) 12.5 MG capsule, Take 12.5 mg by mouth daily.   Current Outpatient Medications (Analgesics):  .  naproxen (NAPROSYN) 500 MG tablet, Take 1 tablet (500 mg total) by mouth 2 (two) times daily with a meal.      Past medical history, social, surgical and family history all reviewed in electronic medical record.  No pertanent  information unless stated regarding to the chief complaint.   Review of Systems:  No headache, visual changes, nausea, vomiting, diarrhea, constipation, dizziness, abdominal pain, skin rash, fevers, chills, night sweats, weight loss, swollen lymph nodes, body aches, joint swelling, muscle aches, chest pain, shortness of breath, mood changes.   Objective  Blood pressure 118/84, pulse 70, height 4\' 10"  (1.473 m), weight 245 lb (111.1 kg), SpO2 98 %.    General: No apparent distress alert and oriented x3 mood and affect normal, dressed appropriately.  Morbidly obese HEENT: Pupils equal, extraocular movements intact  Respiratory: Patient's speak in full sentences and does not appear short of breath  Cardiovascular: No lower extremity edema, non tender, no erythema  Skin: Warm dry intact with no signs of infection or rash on extremities or on axial skeleton.    Abdomen: Soft nontender  Neuro: Cranial nerves II through XII are intact, neurovascularly intact in all extremities with 2+ DTRs and 2+ pulses.  Lymph: No lymphadenopathy of posterior or anterior cervical chain or axillae bilaterally.  Gait normal with good balance and coordination.  MSK:  Non tender with full range of motion and good stability and symmetric strength and tone of shoulders, wrist, hip, knee and ankles bilaterally.  Elbow: Right Unremarkable to inspection. Range of motion shows some mild loss of extension and flexion by 5 degrees.  Increasing discomfort with resisted wrist extension at the lateral epicondylar region Stable to varus, valgus stress. Negative moving valgus stress test. Pain over the lateral epicondylar region Ulnar nerve does not sublux. Negative cubital tunnel Tinel's. Contralateral elbow unremarkable   Musculoskeletal ultrasound was performed and interpreted by [place] D.O.   Elbow: right  Limited ultrasound of patient's right double vision the patient does have hypoechoic changes and increasing Doppler flow at the insertion of the common extensor tendon over the lateral epicondylar region.  No true tearing noted.  Some irritation of the radial nerve is present.  IMPRESSION: Right lateral epicondylitis  [id number]; 15 additional minutes spent for Therapeutic exercises as stated in above notes.  This included exercises focusing on stretching, strengthening, with significant focus on eccentric aspects.   Long term goals include an improvement in range of motion, strength, endurance as well as avoiding reinjury. Patient's frequency would include in 1-2 times a day, 3-5 times a week for a duration of 6-12 weeks.   Lateral Epicondylitis: Elbow anatomy was reviewed, and tendinopathy was explained.  Pt. given a formal rehab program. Series of concentric and eccentric exercises should be done starting with no weight, work up to 1 lb, hammer, etc.  Use counterforce  strap if working or using hands.  Formal PT would be beneficial. Emphasized stretching an cross-friction massage Emphasized proper palms up lifting biomechanics to unload ECRB Proper technique shown and discussed handout in great detail with ATC.  All questions were discussed and answered.     Impression and Recommendations:     This case required medical decision making of moderate complexity. The above documentation has been reviewed and is accurate and complete Judi Saa, DO       Note: This dictation was prepared with Dragon dictation along with smaller phrase technology. Any transcriptional errors that result from this process are unintentional.

## 2018-03-19 ENCOUNTER — Ambulatory Visit (INDEPENDENT_AMBULATORY_CARE_PROVIDER_SITE_OTHER)
Admission: RE | Admit: 2018-03-19 | Discharge: 2018-03-19 | Disposition: A | Discharge status: Home / Self Care | Payer: BLUE CROSS/BLUE SHIELD | Source: Ambulatory Visit | Attending: Family Medicine | Admitting: Family Medicine

## 2018-03-19 ENCOUNTER — Ambulatory Visit: Payer: Self-pay

## 2018-03-19 ENCOUNTER — Ambulatory Visit (INDEPENDENT_AMBULATORY_CARE_PROVIDER_SITE_OTHER): Payer: BLUE CROSS/BLUE SHIELD | Admitting: Family Medicine

## 2018-03-19 ENCOUNTER — Encounter: Payer: Self-pay | Admitting: Family Medicine

## 2018-03-19 VITALS — BP 118/84 | HR 70 | Ht <= 58 in | Wt 245.0 lb

## 2018-03-19 DIAGNOSIS — M25521 Pain in right elbow: Secondary | ICD-10-CM

## 2018-03-19 DIAGNOSIS — M7711 Lateral epicondylitis, right elbow: Secondary | ICD-10-CM | POA: Insufficient documentation

## 2018-03-19 NOTE — Patient Instructions (Addendum)
Good to see you  Ice 20 minutes 2 times daily. Usually after activity and before bed. Wrist brace day and night for 1-2 weeks then  Exercises 3 times a week.  pennsaid pinkie amount topically 2 times daily as needed.   See me again in 4 weeks

## 2018-03-19 NOTE — Assessment & Plan Note (Signed)
Right lateral hip colitis.  Wrist brace given, topical anti-inflammatories, home exercise.  Discussed posture ergonomics lifting mechanics.  Home exercises work with Event organiser.  Follow-up again in 4 weeks

## 2018-04-16 ENCOUNTER — Ambulatory Visit: Payer: BLUE CROSS/BLUE SHIELD | Admitting: Family Medicine

## 2018-04-17 ENCOUNTER — Encounter: Payer: BLUE CROSS/BLUE SHIELD | Admitting: Podiatry

## 2018-04-22 NOTE — Progress Notes (Signed)
This encounter was created in error - please disregard.

## 2018-06-17 DIAGNOSIS — E0590 Thyrotoxicosis, unspecified without thyrotoxic crisis or storm: Secondary | ICD-10-CM | POA: Diagnosis not present

## 2018-06-17 DIAGNOSIS — I10 Essential (primary) hypertension: Secondary | ICD-10-CM | POA: Diagnosis not present

## 2018-06-19 ENCOUNTER — Emergency Department (HOSPITAL_COMMUNITY): Payer: BLUE CROSS/BLUE SHIELD

## 2018-06-19 ENCOUNTER — Emergency Department (HOSPITAL_COMMUNITY)
Admission: EM | Admit: 2018-06-19 | Discharge: 2018-06-20 | Disposition: A | Discharge status: Home / Self Care | Payer: BLUE CROSS/BLUE SHIELD | Attending: Emergency Medicine | Admitting: Emergency Medicine

## 2018-06-19 ENCOUNTER — Encounter (HOSPITAL_COMMUNITY): Payer: Self-pay

## 2018-06-19 ENCOUNTER — Other Ambulatory Visit: Payer: Self-pay

## 2018-06-19 DIAGNOSIS — J111 Influenza due to unidentified influenza virus with other respiratory manifestations: Secondary | ICD-10-CM | POA: Diagnosis not present

## 2018-06-19 DIAGNOSIS — Z87891 Personal history of nicotine dependence: Secondary | ICD-10-CM | POA: Diagnosis not present

## 2018-06-19 DIAGNOSIS — R079 Chest pain, unspecified: Secondary | ICD-10-CM | POA: Diagnosis not present

## 2018-06-19 DIAGNOSIS — R51 Headache: Secondary | ICD-10-CM | POA: Diagnosis not present

## 2018-06-19 DIAGNOSIS — I10 Essential (primary) hypertension: Secondary | ICD-10-CM | POA: Diagnosis not present

## 2018-06-19 DIAGNOSIS — R05 Cough: Secondary | ICD-10-CM | POA: Diagnosis not present

## 2018-06-19 DIAGNOSIS — R69 Illness, unspecified: Secondary | ICD-10-CM

## 2018-06-19 DIAGNOSIS — Z79899 Other long term (current) drug therapy: Secondary | ICD-10-CM | POA: Diagnosis not present

## 2018-06-19 LAB — BASIC METABOLIC PANEL
Anion gap: 8 (ref 5–15)
BUN: 15 mg/dL (ref 6–20)
CALCIUM: 9.1 mg/dL (ref 8.9–10.3)
CO2: 23 mmol/L (ref 22–32)
CREATININE: 0.99 mg/dL (ref 0.44–1.00)
Chloride: 107 mmol/L (ref 98–111)
GFR calc non Af Amer: >60 mL/min (ref >60)
Glucose, Bld: 108 mg/dL — ABNORMAL HIGH (ref 70–99)
Potassium: 3.7 mmol/L (ref 3.5–5.1)
Sodium: 138 mmol/L (ref 135–145)

## 2018-06-19 LAB — CBC
HCT: 42.4 % (ref 36.0–46.0)
Hemoglobin: 14 g/dL (ref 12.0–15.0)
MCH: 29.1 pg (ref 26.0–34.0)
MCHC: 33 g/dL (ref 30.0–36.0)
MCV: 88.1 fL (ref 80.0–100.0)
PLATELETS: 195 10*3/uL (ref 150–400)
RBC: 4.81 MIL/uL (ref 3.87–5.11)
RDW: 13.2 % (ref 11.5–15.5)
WBC: 4.9 10*3/uL (ref 4.0–10.5)
nRBC: 0 % (ref 0.0–0.2)

## 2018-06-19 LAB — I-STAT BETA HCG BLOOD, ED (NOT ORDERABLE): I-stat hCG, quantitative: <5 m[IU]/mL (ref <5)

## 2018-06-19 LAB — POCT I-STAT TROPONIN I: Troponin i, poc: 0 ng/mL (ref 0.00–0.08)

## 2018-06-19 MED ORDER — NAPROXEN 375 MG PO TABS: ORAL_TABLET | ORAL | 0 refills | Status: DC

## 2018-06-19 MED ORDER — ACETAMINOPHEN 325 MG PO TABS
650.0000 mg | ORAL_TABLET | Freq: Once | ORAL | Status: AC | PRN
  Administered 2018-06-20: 650 mg via ORAL
  Filled 2018-06-19: qty 2

## 2018-06-19 MED ORDER — SODIUM CHLORIDE 0.9% FLUSH: 3.0000 mL | Freq: Once | INTRAVENOUS | Status: DC

## 2018-06-19 MED ORDER — AEROCHAMBER Z-STAT PLUS/MEDIUM MISC
1.0000 | Freq: Once | Status: AC
  Administered 2018-06-20: 1
  Filled 2018-06-19: qty 1

## 2018-06-19 MED ORDER — HYDROCOD POLST-CPM POLST ER 10-8 MG/5ML PO SUER: 5.0000 mL | Freq: Two times a day (BID) | ORAL | 0 refills | Status: DC | PRN

## 2018-06-19 MED ORDER — HYDROCOD POLST-CPM POLST ER 10-8 MG/5ML PO SUER
5.0000 mL | Freq: Once | ORAL | Status: AC
  Administered 2018-06-20: 5 mL via ORAL
  Filled 2018-06-19: qty 5

## 2018-06-19 MED ORDER — NAPROXEN 375 MG PO TABS
375.0000 mg | ORAL_TABLET | Freq: Once | ORAL | Status: AC
  Administered 2018-06-20: 375 mg via ORAL
  Filled 2018-06-19: qty 1

## 2018-06-19 MED ORDER — ALBUTEROL SULFATE HFA 108 (90 BASE) MCG/ACT IN AERS
2.0000 | INHALATION_SPRAY | RESPIRATORY_TRACT | Status: DC | PRN
  Administered 2018-06-20: 2 via RESPIRATORY_TRACT
  Filled 2018-06-19: qty 6.7

## 2018-06-19 NOTE — ED Triage Notes (Signed)
Pt reports central chest pain, cough, and SOB since yesterday morning. She also endorses sore throat and post nasal drip. She was visiting her husband earlier this week at the hospital and thinks that she caught something. Denies fever or vomiting. A&Ox4.

## 2018-06-19 NOTE — ED Provider Notes (Signed)
[facility] Provider Note: Lowella Dell, MD, FACEP  CSN: [id number] MRN: [id number] ARRIVAL: 06/19/18 at 2025 ROOM: WA17/WA17   CHIEF COMPLAINT  Chest Pain and Cough   HISTORY OF PRESENT ILLNESS  06/19/18 11:25 PM Sally Reid is a 58 y.o. female with a 2-day history of flulike symptoms.  Specifically she has had a nonproductive cough, shortness of breath, sternal and rib pain worse with coughing or deep breathing, nasal congestion, postnasal drip and scratchy throat.  She denies nausea, vomiting or diarrhea.  She has been taking Mucinex and 1 dose of Aleve without adequate relief.   Past Medical History:  Diagnosis Date  . Arthritis   . Hypertension     Past Surgical History:  Procedure Laterality Date  . ABDOMINAL HYSTERECTOMY     2005  . ANKLE FRACTURE SURGERY Left   . arthroscopy of left knee  2008   for cartilage surgery  . CESAREAN SECTION     2 previous  . LEG SURGERY      Family History  Problem Relation Age of Onset  . Diabetes Mother   . Hypertension Mother     Social History   Tobacco Use  . Smoking status: Former Smoker    Packs/day: 0.25  . Smokeless tobacco: Never Used  Substance Use Topics  . Alcohol use: Yes    Comment: occassionally  . Drug use: No    Prior to Admission medications   Medication Sig Start Date End Date Taking? Authorizing Provider  hydrochlorothiazide (MICROZIDE) 12.5 MG capsule Take 12.5 mg by mouth daily.   Yes [provider]  naproxen (NAPROSYN) 500 MG tablet Take 1 tablet (500 mg total) by mouth 2 (two) times daily with a meal. Patient not taking: Reported on 06/19/2018 02/06/18   Mardella Layman, MD    Allergies Patient has no known allergies.   REVIEW OF SYSTEMS  Negative except as noted here or in the History of Present Illness.   PHYSICAL EXAMINATION  Initial Vital Signs Blood pressure (!) 186/93, pulse 96, temperature 99.4 F (37.4 C), temperature source Oral, resp. rate 18, SpO2 98  %.  Examination General: Well-developed, well-nourished female in no acute distress; appearance consistent with age of record HENT: normocephalic; atraumatic; nasal congestion Eyes: pupils equal, round and reactive to light; extraocular muscles intact Neck: supple Heart: regular rate and rhythm Lungs: clear to auscultation bilaterally; coughing on exhalation Abdomen: soft; nondistended; nontender; bowel sounds present Extremities: No deformity; full range of motion; pulses normal Neurologic: Awake, alert and oriented; motor function intact in all extremities and symmetric; no facial droop Skin: Warm and dry Psychiatric: Normal mood and affect   RESULTS  Summary of this visit's results, reviewed by myself:   EKG Interpretation  Date/Time:  Thursday June 19 2018 20:38:40 EST Ventricular Rate:  92 PR Interval:    QRS Duration: 84 QT Interval:  366 QTC Calculation: 453 R Axis:   45 Text Interpretation:  Sinus rhythm Baseline wander in lead(s) I II III aVR aVL aVF V1 V2 V3 V4 V5 V6 Confirmed by Nekisha Mcdiarmid ([id number]) on 06/19/2018 11:25:33 PM      Laboratory Studies: Results for orders placed or performed during the hospital encounter of 06/19/18 (from the past 24 hour(s))  Basic metabolic panel     Status: Abnormal   Collection Time: 06/19/18  9:22 PM  Result Value Ref Range   Sodium 138 135 - 145 mmol/L   Potassium 3.7 3.5 - 5.1 mmol/L   Chloride 107 98 -  111 mmol/L   CO2 23 22 - 32 mmol/L   Glucose, Bld 108 (H) 70 - 99 mg/dL   BUN 15 6 - 20 mg/dL   Creatinine, Ser 3.41 0.44 - 1.00 mg/dL   Calcium 9.1 8.9 - 93.7 mg/dL   GFR calc non Af Amer >60 >60 mL/min   GFR calc Af Amer >60 >60 mL/min   Anion gap 8 5 - 15  CBC     Status: None   Collection Time: 06/19/18  9:22 PM  Result Value Ref Range   WBC 4.9 4.0 - 10.5 K/uL   RBC 4.81 3.87 - 5.11 MIL/uL   Hemoglobin 14.0 12.0 - 15.0 g/dL   HCT 90.2 40.9 - 73.5 %   MCV 88.1 80.0 - 100.0 fL   MCH 29.1 26.0 - 34.0 pg   MCHC  33.0 30.0 - 36.0 g/dL   RDW 32.9 92.4 - 26.8 %   Platelets 195 150 - 400 K/uL   nRBC 0.0 0.0 - 0.2 %  POCT i-Stat troponin I     Status: None   Collection Time: 06/19/18  9:27 PM  Result Value Ref Range   Troponin i, poc 0.00 0.00 - 0.08 ng/mL   Comment 3          I-Stat beta hCG blood, ED     Status: None   Collection Time: 06/19/18  9:38 PM  Result Value Ref Range   I-stat hCG, quantitative <5.0 <5 mIU/mL   Comment 3           Imaging Studies: Dg Chest 2 View  Result Date: 06/19/2018 CLINICAL DATA:  Headache EXAM: CHEST - 2 VIEW COMPARISON:  Chest radiograph 08/29/2017 FINDINGS: Monitoring leads overlie the patient. Normal cardiac and mediastinal contours. No consolidative pulmonary opacities. No pleural effusion or pneumothorax. IMPRESSION: No acute cardiopulmonary process. Electronically Signed   By: Annia Belt M.D.   On: 06/19/2018 21:14    ED COURSE and MDM  Nursing notes and initial vitals signs, including pulse oximetry, reviewed.  Vitals:   06/19/18 2035  BP: (!) 186/93  Pulse: 96  Resp: 18  Temp: 99.4 F (37.4 C)  TempSrc: Oral  SpO2: 98%   History and exam consistent with a flulike illness.  Will provide an inhaler and antitussive.   PROCEDURES    ED DIAGNOSES     ICD-10-CM   1. Influenza-like illness R69       Vernie Vinciguerra, MD 06/19/18 2345

## 2018-06-20 ENCOUNTER — Other Ambulatory Visit: Payer: Self-pay | Admitting: Nurse Practitioner

## 2018-06-20 DIAGNOSIS — I10 Essential (primary) hypertension: Secondary | ICD-10-CM | POA: Diagnosis not present

## 2018-06-20 DIAGNOSIS — B9789 Other viral agents as the cause of diseases classified elsewhere: Secondary | ICD-10-CM | POA: Diagnosis not present

## 2018-06-20 DIAGNOSIS — E0590 Thyrotoxicosis, unspecified without thyrotoxic crisis or storm: Secondary | ICD-10-CM

## 2018-06-20 DIAGNOSIS — J069 Acute upper respiratory infection, unspecified: Secondary | ICD-10-CM | POA: Diagnosis not present

## 2018-06-25 ENCOUNTER — Ambulatory Visit
Admission: RE | Admit: 2018-06-25 | Discharge: 2018-06-25 | Disposition: A | Discharge status: Home / Self Care | Payer: BLUE CROSS/BLUE SHIELD | Source: Ambulatory Visit | Attending: Nurse Practitioner | Admitting: Nurse Practitioner

## 2018-06-25 DIAGNOSIS — E042 Nontoxic multinodular goiter: Secondary | ICD-10-CM | POA: Diagnosis not present

## 2018-06-25 DIAGNOSIS — E0590 Thyrotoxicosis, unspecified without thyrotoxic crisis or storm: Secondary | ICD-10-CM

## 2018-07-14 ENCOUNTER — Encounter: Payer: Self-pay | Admitting: Internal Medicine

## 2018-07-14 ENCOUNTER — Ambulatory Visit (INDEPENDENT_AMBULATORY_CARE_PROVIDER_SITE_OTHER): Payer: BLUE CROSS/BLUE SHIELD | Admitting: Internal Medicine

## 2018-07-14 VITALS — BP 132/68 | HR 70 | Ht 59.0 in | Wt 243.4 lb

## 2018-07-14 DIAGNOSIS — E042 Nontoxic multinodular goiter: Secondary | ICD-10-CM | POA: Diagnosis not present

## 2018-07-14 DIAGNOSIS — R7989 Other specified abnormal findings of blood chemistry: Secondary | ICD-10-CM | POA: Diagnosis not present

## 2018-07-14 LAB — TSH: TSH: 0.36 u[IU]/mL (ref 0.35–4.50)

## 2018-07-14 LAB — T4, FREE: Free T4: 1.14 ng/dL (ref 0.60–1.60)

## 2018-07-14 NOTE — Patient Instructions (Signed)
-   Please stop by the lab today, we will contact you with the results.    

## 2018-07-14 NOTE — Progress Notes (Signed)
Name: Sally Reid  MRN/ DOB: [id number], 06/25/1960    Age/ Sex: 58 y.o., female    PCP: Levonne Lapping, NP   Reason for Endocrinology Evaluation: Hyperthyroidism      Date of Initial Endocrinology Evaluation: 07/14/2018     HPI: Sally Reid is a 58 y.o. female with a past medical history of Migraine headaches . The patient presented for initial endocrinology clinic visit on 07/14/2018 for consultative assistance with her hyperthyroidism .   Pt presented to her PCP with c/o feeling "something in her throat" for the past 6 months, she denies dysphagia, sob . She has noted hoarseness . She dnies increase in size or pain.   Pt has been treated for cold symptoms in early January.   She denies weight loss, palpitations, diarrhea or jittery sensations.   She has noted heat intolerance, and mild tremors   Denies prior exposure to radiation  No FH of thyroid disease   Sh had hysterotomy ~ 21 ys  HISTORY:  Past Medical History:  Past Medical History:  Diagnosis Date  . Arthritis   . Hypertension    Past Surgical History:  Past Surgical History:  Procedure Laterality Date  . ABDOMINAL HYSTERECTOMY     2005  . ANKLE FRACTURE SURGERY Left   . arthroscopy of left knee  2008   for cartilage surgery  . CESAREAN SECTION     2 previous  . LEG SURGERY        Social History:  reports that she has quit smoking. She smoked 0.25 packs per day. She has never used smokeless tobacco. She reports current alcohol use. She reports that she does not use drugs.  Family History: family history includes Diabetes in her mother; Hypertension in her mother; Lymphoma in her brother.   HOME MEDICATIONS: Allergies as of 07/14/2018   No Known Allergies     Medication List       Accurate as of July 14, 2018  2:57 PM. Always use your most recent med list.        chlorpheniramine-HYDROcodone 10-8 MG/5ML Suer Commonly known as:  TUSSIONEX PENNKINETIC ER Take 5 mLs by mouth  every 12 (twelve) hours as needed for cough.   hydrochlorothiazide 12.5 MG capsule Commonly known as:  MICROZIDE Take 12.5 mg by mouth daily.   naproxen 375 MG tablet Commonly known as:  NAPROSYN Take 1 tablet twice daily as needed for chest wall pain or fever.         REVIEW OF SYSTEMS: A comprehensive ROS was conducted with the patient and is negative except as per HPI and below:  Review of Systems  Constitutional: Positive for malaise/fatigue. Negative for fever and weight loss.  HENT: Negative for congestion and sore throat.   Respiratory: Positive for cough. Negative for shortness of breath.   Cardiovascular: Negative for chest pain and palpitations.  Gastrointestinal: Negative for diarrhea and nausea.  Genitourinary: Positive for frequency.  Neurological: Positive for tremors. Negative for tingling.  Endo/Heme/Allergies: Negative for polydipsia.  Psychiatric/Behavioral: Negative for depression. The patient is not nervous/anxious.        OBJECTIVE:  VS: BP 132/68 (BP Location: Left Arm, Patient Position: Sitting, Cuff Size: Large)   Pulse 70   Ht  (1.499 m)   Wt 243 lb 6.4 oz (110.4 kg)   SpO2 93%   BMI 49.16 kg/m    Wt Readings from Last 3 Encounters:  07/14/18 243 lb 6.4 oz (110.4 kg)  06/20/18 240 lb (  108.9 kg)  03/19/18 245 lb (111.1 kg)     EXAM: General: Pt appears well and is in NAD  Hydration: Well-hydrated with moist mucous membranes and good skin turgor  Eyes: External eye exam normal without stare, lid lag or exophthalmos.  EOM intact.  PERRL.  Ears, Nose, Throat: Hearing: Grossly intact bilaterally Dental: Good dentition  Throat: Clear without mass, erythema or exudate  Neck: General: Supple without adenopathy. Thyroid: Thyroid size normal.  No goiter or nodules appreciated. No thyroid bruit.  Lungs: Clear with good BS bilat with no rales, rhonchi, or wheezes  Heart: Auscultation: RRR.  Abdomen: Normoactive bowel sounds, soft, nontender,  without masses or organomegaly palpable  Extremities: Gait and station: Normal gait  Digits and nails: No clubbing, cyanosis, petechiae, or nodes Head and neck: Normal alignment and mobility BL UE: Normal ROM and strength. BL LE: No pretibial edema normal ROM and strength.  Skin: Hair: Texture and amount normal with gender appropriate distribution Skin Inspection: No rashes, acanthosis nigricans/skin tags. No lipohypertrophy Skin Palpation: Skin temperature, texture, and thickness normal to palpation  Neuro: Cranial nerves: II - XII grossly intact  Cerebellar: Normal coordination and movement; no tremor Motor: Normal strength throughout DTRs: 2+ and symmetric in UE without delay in relaxation phase  Mental Status: Judgment, insight: Intact Orientation: Oriented to time, place, and person Memory: Intact for recent and remote events Mood and affect: No depression, anxiety, or agitation     DATA REVIEWED: Results for Sally Reid, Sally Reid (MRN [id number]) as of 07/15/2018 12:33  Ref. Range 07/14/2018 15:10  TSH Latest Ref Range: 0.35 - 4.50 uIU/mL 0.36  Triiodothyronine (T3) Latest Ref Range: 76 - 181 ng/dL 409179  W1,XBJY(NWGNFAT4,Free(Direct) Latest Ref Range: 0.60 - 1.60 ng/dL 2.131.14       Thyroid Ultrasound 06/25/18 Parenchymal Echotexture: Mildly heterogenous  Isthmus: 0.5 cm  Right lobe: 4.9 x 1.7 x 2.3 cm  Left lobe: 4.6 x 1.2 x 2.3 cm  _________________________________________________________  Estimated total number of nodules >/= 1 cm: 1  Number of spongiform nodules >/=  2 cm not described below (TR1): 0  Number of mixed cystic and solid nodules >/= 1.5 cm not described below (TR2): 0  _________________________________________________________  Nodule # 3:  Location: Right; Mid  Maximum size: 1.2 cm; Other 2 dimensions: 1.1 x 0.5 cm  Composition: solid/almost completely solid (2)  Echogenicity: hypoechoic (2)  Shape: not taller-than-wide (0)  Margins: smooth  (0)  Echogenic foci: none (0)  ACR TI-RADS total points: 4.  ACR TI-RADS risk category: TR4 (4-6 points).  ACR TI-RADS recommendations:  *Given size (>/= 1 - 1.4 cm) and appearance, a follow-up ultrasound in 1 year should be considered based on TI-RADS criteria.  _________________________________________________________  Additional scattered subcentimeter hypoechoic cysts and nodules present bilaterally which do not warrant further evaluation or follow-up.  IMPRESSION: Mildly enlarged and heterogeneous thyroid gland with multiple thyroid nodules.  The 1.1 cm moderately suspicious (TI-RADS category 4) nodule in the right mid gland (labeled # 3) meets criteria for follow-up ultrasound in 1 year.    ASSESSMENT/PLAN/RECOMMENDATIONS:   1. Hyperthyroidism:  -We discussed the differential diagnosis of autonomous multi-nodular goiter, Graves' disease, thyroiditis -Clinically she is euthyroid -Repeat  TFTs today are  Normal.  - no further intervention at this time   2. MNG :   -She endorses some discomfort in the throat area, I am not sure this is related to her multinodular goiter at this point. -She will need repeat thyroid ultrasound in a year for  right middle thyroid nodule that measures 1.2x 1.1 x 0.5 cm     Follow-up in 6 weeks  Signed electronically by: Lyndle Herrlich, MD  [facility]  [facility] [address] [place], [place] [address] Phone: [phone] FAX: [phone]   CC: Levonne Lapping, NP [address] [place] [address] Phone: [phone] Fax: [phone]   Return to Endocrinology clinic as below: No future appointments.

## 2018-07-15 ENCOUNTER — Encounter: Payer: Self-pay | Admitting: Internal Medicine

## 2018-07-15 ENCOUNTER — Telehealth: Payer: Self-pay | Admitting: Internal Medicine

## 2018-07-15 NOTE — Telephone Encounter (Signed)
Sent and mailed lab results today.

## 2018-07-17 LAB — T3: T3, Total: 179 ng/dL (ref 76–181)

## 2018-07-17 LAB — TRAB (TSH RECEPTOR BINDING ANTIBODY)

## 2018-08-22 ENCOUNTER — Other Ambulatory Visit: Payer: Self-pay

## 2018-08-25 ENCOUNTER — Ambulatory Visit: Payer: BLUE CROSS/BLUE SHIELD | Admitting: Internal Medicine

## 2018-08-25 ENCOUNTER — Ambulatory Visit (INDEPENDENT_AMBULATORY_CARE_PROVIDER_SITE_OTHER): Payer: BLUE CROSS/BLUE SHIELD | Admitting: Internal Medicine

## 2018-08-25 ENCOUNTER — Encounter: Payer: Self-pay | Admitting: Internal Medicine

## 2018-08-25 ENCOUNTER — Other Ambulatory Visit: Payer: Self-pay

## 2018-08-25 VITALS — BP 122/82 | HR 54 | Temp 97.7°F | Ht 59.0 in | Wt 241.0 lb

## 2018-08-25 DIAGNOSIS — E042 Nontoxic multinodular goiter: Secondary | ICD-10-CM | POA: Diagnosis not present

## 2018-08-25 LAB — TSH: TSH: 0.42 u[IU]/mL (ref 0.35–4.50)

## 2018-08-25 LAB — T4, FREE: Free T4: 1.08 ng/dL (ref 0.60–1.60)

## 2018-08-25 NOTE — Progress Notes (Signed)
Name: Sally Reid  MRN/ DOB: [id number], [birth year]    Age/ Sex: 58 y.o., female     PCP: Levonne Lapping, NP   Reason for Endocrinology Evaluation: Subclinical hyperthyroidism/MNG     Initial Endocrinology Clinic Visit: 07/14/2018    PATIENT IDENTIFIER: [name] is a 58 y.o., female with a past medical history of migraine headaches and HTN. She has followed with [facility] since 07/14/2018 for consultative assistance with management of her subclinical hyperthyroidsim.   HISTORICAL SUMMARY: The patient was first diagnosed with MNG in January, 2020 after presenting to her PCP with a c/o "something stuck in her throat" this prompted a thyroid ultrasound which showed multiple thyroid nodules. Per history she also had a low TSH but these records are not available.   SUBJECTIVE:   During last visit (07/14/2018): Repeat TFT's were normal.   Today (08/25/2018):  Sally Reid is here for a 6 week follow up on low TSH and thyroid nodules. She  Denies local neck symptoms. She has lost some weight,but denies palpitations, or diarrhea. She has has occasional anxiety but no tremors.   Pt is not on biotin.    ROS:  As per HPI.   HISTORY:  Past Medical History:  Past Medical History:  Diagnosis Date  . Arthritis   . Hypertension    Past Surgical History:  Past Surgical History:  Procedure Laterality Date  . ABDOMINAL HYSTERECTOMY     2005  . ANKLE FRACTURE SURGERY Left   . arthroscopy of left knee  2008   for cartilage surgery  . CESAREAN SECTION     2 previous  . LEG SURGERY      Social History:  reports that she has quit smoking. She smoked 0.25 packs per day. She has never used smokeless tobacco. She reports current alcohol use. She reports that she does not use drugs. Family History:  Family History  Problem Relation Age of Onset  . Diabetes Mother   . Hypertension Mother   . Lymphoma Brother      HOME MEDICATIONS: Allergies as of 08/25/2018    No Known Allergies     Medication List       Accurate as of August 25, 2018  8:53 AM. Always use your most recent med list.        chlorpheniramine-HYDROcodone 10-8 MG/5ML Suer Commonly known as:  Tussionex Pennkinetic ER Take 5 mLs by mouth every 12 (twelve) hours as needed for cough.   hydrochlorothiazide 12.5 MG capsule Commonly known as:  MICROZIDE Take 12.5 mg by mouth daily.   naproxen 375 MG tablet Commonly known as:  NAPROSYN Take 1 tablet twice daily as needed for chest wall pain or fever.         OBJECTIVE:   PHYSICAL EXAM: VS: BP 122/82 (BP Location: Right Arm, Patient Position: Sitting, Cuff Size: Large)   Pulse (!) 54   Temp 97.7 F (36.5 C)   Ht 4\' 11"  (1.499 m)   Wt 241 lb (109.3 kg)   SpO2 97%   BMI 48.68 kg/m    EXAM: General: Pt appears well and is in NAD  Eyes: External eye exam normal without stare, lid lag or exophthalmos.  EOM intact.    Ears, Nose, Throat: Hearing: Grossly intact bilaterally Dental: Good dentition  Throat: Clear without mass, erythema or exudate  Neck: General: Supple without adenopathy. Thyroid: Thyroid size normal.  No goiter or nodules appreciated. No thyroid bruit.  Lungs: Clear with good BS bilat  with no rales, rhonchi, or wheezes  Heart: Auscultation: RRR.  Abdomen: Normoactive bowel sounds, soft, nontender, without masses or organomegaly palpable  Extremities:  BL LE: No pretibial edema normal ROM and strength.  Skin: Hair: Texture and amount normal with gender appropriate distribution Skin Inspection: No rashes, skin tags noted over the neck area.  Skin Palpation: Skin temperature, texture, and thickness normal to palpation  Mental Status: Judgment, insight: Intact Orientation: Oriented to time, place, and person Mood and affect: No depression, anxiety, or agitation     DATA REVIEWED:  Results for OLAMIDE, ALPHA (MRN [id number]) as of 08/25/2018 11:05  Ref. Range 07/14/2018 15:10 08/25/2018 08:47  TSH  Latest Ref Range: 0.35 - 4.50 uIU/mL 0.36 0.42  Triiodothyronine (T3) Latest Ref Range: 76 - 181 ng/dL 559   R4,BULA(GTXMIW) Latest Ref Range: 0.60 - 1.60 ng/dL 8.03 2.12     ASSESSMENT / PLAN / RECOMMENDATIONS:   1. Multiple Thyroid Nodules :  - Pt is clinically euthyroid  - No local neck symptoms - Repeat TFT's have been normal.  - Will repeat thyroid ultrasound in January, 2021 based on TI-RADS criteria for the right mid thyroid nodule.    F/u in 10 months      Signed electronically by: Lyndle Herrlich, MD  [facility]  [facility] [address] [place], [place] [address] Phone: [phone] FAX: [phone]      CC: Levonne Lapping, NP [address] [place] [address] Phone: [phone]  Fax: [phone]   Return to Endocrinology clinic as below: Future Appointments  Date Time Provider Department Center  06/09/2019  8:30 AM , Konrad Dolores, MD [id number] None

## 2018-08-26 LAB — T3: T3, Total: 188 ng/dL — ABNORMAL HIGH (ref 76–181)

## 2018-08-26 NOTE — Addendum Note (Signed)
Addended by: Scarlette Shorts on: 08/26/2018 12:06 PM   Modules accepted: Orders

## 2018-11-20 DIAGNOSIS — Z111 Encounter for screening for respiratory tuberculosis: Secondary | ICD-10-CM | POA: Diagnosis not present

## 2018-11-25 DIAGNOSIS — F17210 Nicotine dependence, cigarettes, uncomplicated: Secondary | ICD-10-CM | POA: Diagnosis not present

## 2018-11-25 DIAGNOSIS — I10 Essential (primary) hypertension: Secondary | ICD-10-CM | POA: Diagnosis not present

## 2018-12-10 ENCOUNTER — Other Ambulatory Visit: Payer: Self-pay | Admitting: *Deleted

## 2018-12-10 DIAGNOSIS — Z20822 Contact with and (suspected) exposure to covid-19: Secondary | ICD-10-CM

## 2018-12-10 DIAGNOSIS — R6889 Other general symptoms and signs: Secondary | ICD-10-CM | POA: Diagnosis not present

## 2018-12-12 ENCOUNTER — Telehealth: Payer: Self-pay | Admitting: Nurse Practitioner

## 2018-12-12 NOTE — Telephone Encounter (Signed)
Pt wants a letter for her job on behalf of her husband that shes clear and can go back to work..please follow up

## 2018-12-15 LAB — NOVEL CORONAVIRUS, NAA: SARS-CoV-2, NAA: NOT DETECTED

## 2018-12-17 ENCOUNTER — Telehealth: Payer: Self-pay

## 2018-12-17 ENCOUNTER — Encounter (HOSPITAL_BASED_OUTPATIENT_CLINIC_OR_DEPARTMENT_OTHER): Payer: Self-pay | Admitting: *Deleted

## 2018-12-17 ENCOUNTER — Other Ambulatory Visit: Payer: Self-pay

## 2018-12-17 ENCOUNTER — Emergency Department (HOSPITAL_BASED_OUTPATIENT_CLINIC_OR_DEPARTMENT_OTHER): Payer: BC Managed Care – PPO

## 2018-12-17 ENCOUNTER — Emergency Department (HOSPITAL_BASED_OUTPATIENT_CLINIC_OR_DEPARTMENT_OTHER)
Admission: EM | Admit: 2018-12-17 | Discharge: 2018-12-17 | Disposition: A | Discharge status: Home / Self Care | Payer: BC Managed Care – PPO | Attending: Emergency Medicine | Admitting: Emergency Medicine

## 2018-12-17 DIAGNOSIS — Z87891 Personal history of nicotine dependence: Secondary | ICD-10-CM | POA: Insufficient documentation

## 2018-12-17 DIAGNOSIS — I10 Essential (primary) hypertension: Secondary | ICD-10-CM | POA: Diagnosis not present

## 2018-12-17 DIAGNOSIS — R079 Chest pain, unspecified: Secondary | ICD-10-CM | POA: Diagnosis not present

## 2018-12-17 DIAGNOSIS — Z20828 Contact with and (suspected) exposure to other viral communicable diseases: Secondary | ICD-10-CM | POA: Diagnosis not present

## 2018-12-17 DIAGNOSIS — Z79899 Other long term (current) drug therapy: Secondary | ICD-10-CM | POA: Insufficient documentation

## 2018-12-17 DIAGNOSIS — R0602 Shortness of breath: Secondary | ICD-10-CM | POA: Diagnosis not present

## 2018-12-17 DIAGNOSIS — R072 Precordial pain: Secondary | ICD-10-CM | POA: Diagnosis not present

## 2018-12-17 LAB — CBC
HCT: 42.8 % (ref 36.0–46.0)
Hemoglobin: 14.3 g/dL (ref 12.0–15.0)
MCH: 29.4 pg (ref 26.0–34.0)
MCHC: 33.4 g/dL (ref 30.0–36.0)
MCV: 87.9 fL (ref 80.0–100.0)
Platelets: 231 10*3/uL (ref 150–400)
RBC: 4.87 MIL/uL (ref 3.87–5.11)
RDW: 13 % (ref 11.5–15.5)
WBC: 6.6 10*3/uL (ref 4.0–10.5)
nRBC: 0 % (ref 0.0–0.2)

## 2018-12-17 LAB — BASIC METABOLIC PANEL
Anion gap: 10 (ref 5–15)
BUN: 15 mg/dL (ref 6–20)
CO2: 26 mmol/L (ref 22–32)
Calcium: 9.2 mg/dL (ref 8.9–10.3)
Chloride: 103 mmol/L (ref 98–111)
Creatinine, Ser: 0.86 mg/dL (ref 0.44–1.00)
GFR calc Af Amer: >60 mL/min (ref >60)
GFR calc non Af Amer: >60 mL/min (ref >60)
Glucose, Bld: 91 mg/dL (ref 70–99)
Potassium: 3.4 mmol/L — ABNORMAL LOW (ref 3.5–5.1)
Sodium: 139 mmol/L (ref 135–145)

## 2018-12-17 LAB — SARS CORONAVIRUS 2 BY RT PCR (HOSPITAL ORDER, PERFORMED IN ~~LOC~~ HOSPITAL LAB): SARS Coronavirus 2: NEGATIVE

## 2018-12-17 LAB — TROPONIN I (HIGH SENSITIVITY)
Troponin I (High Sensitivity): 6 ng/L (ref <18)
Troponin I (High Sensitivity): 7 ng/L (ref <18)

## 2018-12-17 LAB — D-DIMER, QUANTITATIVE: D-Dimer, Quant: 1 ug/mL-FEU — ABNORMAL HIGH (ref 0.00–0.50)

## 2018-12-17 MED ORDER — ASPIRIN 81 MG PO CHEW
324.0000 mg | CHEWABLE_TABLET | Freq: Once | ORAL | Status: AC
  Administered 2018-12-17: 324 mg via ORAL
  Filled 2018-12-17: qty 4

## 2018-12-17 MED ORDER — IOHEXOL 350 MG/ML SOLN
100.0000 mL | Freq: Once | INTRAVENOUS | Status: AC | PRN
  Administered 2018-12-17: 100 mL via INTRAVENOUS

## 2018-12-17 NOTE — ED Provider Notes (Signed)
[facility] EMERGENCY DEPARTMENT Provider Note   CSN: [id number] Arrival date & time: 12/17/18  0906     History   Chief Complaint Chief Complaint  Patient presents with  . Chest Pain    HPI Sally Reid is a 58 y.o. female.  She is complaining of substernal sharp stabbing chest pain that began a few days ago but is been more constant today since waking up.  Is associated with some shortness of breath.  She said she went for a morning walk which did not change the pain.  No change with deep breath.  Intermittent cough.  She said she has been isolating from a Covid exposure for the last week.  No fevers.  No nausea vomiting diarrhea.  She has some chronic left leg swelling from prior orthopedic injuries.  No prior cardiac disease.     The history is provided by the patient.  Chest Pain Pain location:  Substernal area Pain quality: sharp   Pain radiates to:  Upper back Pain severity:  Moderate Onset quality:  Gradual Timing:  Intermittent Progression:  Unchanged Chronicity:  New Relieved by:  Nothing Worsened by:  Nothing Ineffective treatments:  None tried Associated symptoms: back pain, cough and shortness of breath   Associated symptoms: no abdominal pain, no dysphagia, no fever, no headache, no heartburn, no nausea, no near-syncope, no numbness, no syncope, no vomiting and no weakness     Past Medical History:  Diagnosis Date  . Arthritis   . Hypertension     Patient Active Problem List   Diagnosis Date Noted  . Multiple thyroid nodules 07/14/2018  . Low TSH level 07/14/2018  . Right lateral epicondylitis 03/19/2018  . CHEST PAIN 04/21/2009  . ELEVATED BP READING WITHOUT DX HYPERTENSION 04/21/2009  . OBESITY 12/08/2008  . 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 BACK PAIN 12/08/2008    Past Surgical History:  Procedure Laterality Date  . ABDOMINAL HYSTERECTOMY     2005  . ANKLE FRACTURE SURGERY Left   . arthroscopy of  left knee  2008   for cartilage surgery  . CESAREAN SECTION     2 previous  . LEG SURGERY       OB History    Gravida  3   Para  2   Term  2   Preterm      AB  1   Living  2     SAB      TAB  1   Ectopic      Multiple      Live Births               Home Medications    Prior to Admission medications   Medication Sig Start Date End Date Taking? Authorizing Provider  hydrochlorothiazide (MICROZIDE) 12.5 MG capsule Take 12.5 mg by mouth daily.   Yes [provider]  multivitamin-iron-minerals-folic acid (CENTRUM) chewable tablet Chew 1 tablet by mouth daily.   Yes [provider]  naproxen (NAPROSYN) 375 MG tablet Take 1 tablet twice daily as needed for chest wall pain or fever. 06/19/18   Molpus, John, MD    Family History Family History  Problem Relation Age of Onset  . Diabetes Mother   . Hypertension Mother   . Lymphoma Brother     Social History Social History   Tobacco Use  . Smoking status: Former Smoker    Packs/day: 0.25  . Smokeless tobacco: Never Used  Substance  Use Topics  . Alcohol use: Yes    Comment: occassionally  . Drug use: No     Allergies   Patient has no known allergies.   Review of Systems Review of Systems  Constitutional: Negative for fever.  HENT: Negative for sore throat and trouble swallowing.   Eyes: Negative for visual disturbance.  Respiratory: Positive for cough and shortness of breath.   Cardiovascular: Positive for chest pain. Negative for syncope and near-syncope.  Gastrointestinal: Negative for abdominal pain, heartburn, nausea and vomiting.  Genitourinary: Negative for dysuria.  Musculoskeletal: Positive for back pain.  Skin: Negative for rash.  Neurological: Negative for weakness, numbness and headaches.     Physical Exam Updated Vital Signs BP 140/81   Pulse 81   Temp 98.5 F (36.9 C) (Oral)   Resp 18   Ht 4\' 11"  (1.499 m)   Wt 107 kg   SpO2 100%   BMI 47.63 kg/m    Physical Exam Vitals signs and nursing note reviewed.  Constitutional:      General: She is not in acute distress.    Appearance: She is well-developed.  HENT:     Head: Normocephalic and atraumatic.  Eyes:     Conjunctiva/sclera: Conjunctivae normal.  Neck:     Musculoskeletal: Neck supple.  Cardiovascular:     Rate and Rhythm: Normal rate and regular rhythm.     Heart sounds: Normal heart sounds. No murmur.  Pulmonary:     Effort: Pulmonary effort is normal. No respiratory distress.     Breath sounds: Normal breath sounds.  Chest:     Chest wall: Tenderness (some mild reproducable) present.  Abdominal:     Palpations: Abdomen is soft.     Tenderness: There is no abdominal tenderness.  Musculoskeletal: Normal range of motion.     Right lower leg: She exhibits no tenderness.     Left lower leg: She exhibits no tenderness.  Skin:    General: Skin is warm and dry.     Capillary Refill: Capillary refill takes less than 2 seconds.  Neurological:     General: No focal deficit present.     Mental Status: She is alert.      ED Treatments / Results  Labs (all labs ordered are listed, but only abnormal results are displayed) Labs Reviewed  BASIC METABOLIC PANEL - Abnormal; Notable for the following components:      Result Value   Potassium 3.4 (*)    All other components within normal limits  D-DIMER, QUANTITATIVE (NOT AT [facility]) - Abnormal; Notable for the following components:   D-Dimer, Quant 1.00 (*)    All other components within normal limits  SARS CORONAVIRUS 2 (HOSPITAL ORDER, PERFOR[facility])  CBC  TROPONIN I (HIGH SENSITIVITY)  TROPONIN I (HIGH SENSITIVITY)    EKG EKG Interpretation  Date/Time:  Wednesday December 17 2018 09:33:22 EDT Ventricular Rate:  66 PR Interval:    QRS Duration: 87 QT Interval:  418 QTC Calculation: 438 R Axis:   47 Text Interpretation:  Sinus rhythm No significant change since 1/20 Confirmed by Meridee ScoreButler, Michael  [phone]([id number]) on 12/17/2018 9:52:57 AM   Radiology Ct Angio Chest Pe W/cm &/or Wo Cm  Result Date: 12/17/2018 CLINICAL DATA:  Chest pain. EXAM: CT ANGIOGRAPHY CHEST WITH CONTRAST TECHNIQUE: Multidetector CT imaging of the chest was performed using the standard protocol during bolus administration of intravenous contrast. Multiplanar CT image reconstructions and MIPs were obtained to evaluate the vascular anatomy. CONTRAST:  195mL OMNIPAQUE IOHEXOL 350 MG/ML SOLN COMPARISON:  CT scan of July 07, 2016. FINDINGS: Cardiovascular: Satisfactory opacification of the pulmonary arteries to the segmental level. No evidence of pulmonary embolism. Normal heart size. No pericardial effusion. Mediastinum/Nodes: No enlarged mediastinal, hilar, or axillary lymph nodes. Thyroid gland, trachea, and esophagus demonstrate no significant findings. Lungs/Pleura: Lungs are clear. No pleural effusion or pneumothorax. Upper Abdomen: No acute abnormality. Musculoskeletal: No chest wall abnormality. No acute or significant osseous findings. Review of the MIP images confirms the above findings. IMPRESSION: No definite evidence of pulmonary embolus. No definite abnormality seen in the chest. Electronically Signed   By: Marijo Conception M.D.   On: 12/17/2018 12:31   Dg Chest Port 1 View  Result Date: 12/17/2018 CLINICAL DATA:  Chest pain EXAM: PORTABLE CHEST 1 VIEW COMPARISON:  06/19/2018 FINDINGS: The heart size and mediastinal contours are within normal limits. No focal consolidation, pleural effusion, or pneumothorax. The visualized skeletal structures are unremarkable. IMPRESSION: No acute cardiopulmonary process. Electronically Signed   By: Davina Poke M.D.   On: 12/17/2018 10:47    Procedures Procedures (including critical care time)  Medications Ordered in ED Medications  aspirin chewable tablet 324 mg (324 mg Oral Given 12/17/18 1023)  iohexol (OMNIPAQUE) 350 MG/ML injection 100 mL (100 mLs Intravenous Contrast Given  12/17/18 1136)     Initial Impression / Assessment and Plan / ED Course  I have reviewed the triage vital signs and the nursing notes.  Pertinent labs & imaging results that were available during my care of the patient were reviewed by me and considered in my medical decision making (see chart for details).  Clinical Course as of Dec 17 1033  Wed Dec 17, 5630  6949 57 year old female with no significant past medical history here with 2 or 3 days of on and off sharp stabbing chest pain that is been more constant today.  Associated with some cough and some shortness of breath.  Differential includes ischemia, GERD, musculoskeletal, PE, Covid, pneumonia.   [MB]  8938 CT chest shows no evidence of PE.  Awaiting delta troponin.   [MB]  1311 Patient's delta troponin unchanged.  Will review this with her and likely discharge.   [MB]    Clinical Course User Index [MB] Hayden Rasmussen, MD        Final Clinical Impressions(s) / ED Diagnoses   Final diagnoses:  Nonspecific chest pain    ED Discharge Orders    None       Hayden Rasmussen, MD 12/18/18 1035

## 2018-12-17 NOTE — Discharge Instructions (Signed)
You were seen in the emergency department for chest pain and shortness of breath.  You had blood work EKG and a CAT scan of your chest that did not show any serious findings.  You will need to follow-up with your primary care doctor for further work-up of the symptoms.  You were also tested for Covid and were negative.  Please return if any concerns.

## 2018-12-17 NOTE — ED Triage Notes (Signed)
Intermittent sharp chest pain started 2 days ago and it gets worst.

## 2018-12-17 NOTE — Telephone Encounter (Signed)
Mailed pt. COVID results to home address, per her request.     Copied from [place] [phone]. Topic: General - Other >> Dec 17, 2018  1:41 PM Rainey Pines A wrote: Patient stated that she would like her  covid results mailed to her. Verified patients  mailing address.

## 2018-12-18 ENCOUNTER — Other Ambulatory Visit: Payer: Self-pay

## 2018-12-18 ENCOUNTER — Encounter (HOSPITAL_BASED_OUTPATIENT_CLINIC_OR_DEPARTMENT_OTHER): Payer: Self-pay | Admitting: Emergency Medicine

## 2018-12-18 ENCOUNTER — Emergency Department (HOSPITAL_BASED_OUTPATIENT_CLINIC_OR_DEPARTMENT_OTHER)
Admission: EM | Admit: 2018-12-18 | Discharge: 2018-12-18 | Disposition: A | Discharge status: Home / Self Care | Payer: BC Managed Care – PPO | Attending: Emergency Medicine | Admitting: Emergency Medicine

## 2018-12-18 DIAGNOSIS — Z79899 Other long term (current) drug therapy: Secondary | ICD-10-CM | POA: Insufficient documentation

## 2018-12-18 DIAGNOSIS — I10 Essential (primary) hypertension: Secondary | ICD-10-CM | POA: Insufficient documentation

## 2018-12-18 DIAGNOSIS — Z87891 Personal history of nicotine dependence: Secondary | ICD-10-CM | POA: Diagnosis not present

## 2018-12-18 DIAGNOSIS — T7840XA Allergy, unspecified, initial encounter: Secondary | ICD-10-CM

## 2018-12-18 DIAGNOSIS — R21 Rash and other nonspecific skin eruption: Secondary | ICD-10-CM | POA: Diagnosis present

## 2018-12-18 MED ORDER — DIPHENHYDRAMINE HCL 25 MG PO CAPS
50.0000 mg | ORAL_CAPSULE | Freq: Once | ORAL | Status: AC
  Administered 2018-12-18: 50 mg via ORAL
  Filled 2018-12-18: qty 2

## 2018-12-18 NOTE — ED Triage Notes (Signed)
Pt reports hives to chest, neck and back since last night.

## 2018-12-18 NOTE — ED Provider Notes (Signed)
[facility] EMERGENCY DEPARTMENT Provider Note   CSN: [id number] Arrival date & time: 12/18/18  7253    History   Chief Complaint Chief Complaint  Patient presents with  . Allergic Reaction    HPI Sally Reid is a 58 y.o. female.     HPI 58 year old female presents with rash and itching.  Started in the middle the night.  She was here yesterday and had a work-up for chest pain including a CT angiography.  Test was performed around noon.  No symptoms until the middle the night tonight when she developed rash and itching to her chest, neck and back.  A little bit in her left forearm and leg.  Better with hydrocortisone cream but still little itchy in the back.  No trouble breathing or swallowing or feeling like there is swelling in her throat.  No vomiting.  Past Medical History:  Diagnosis Date  . Arthritis   . Hypertension     Patient Active Problem List   Diagnosis Date Noted  . Multiple thyroid nodules 07/14/2018  . Low TSH level 07/14/2018  . Right lateral epicondylitis 03/19/2018  . CHEST PAIN 04/21/2009  . ELEVATED BP READING WITHOUT DX HYPERTENSION 04/21/2009  . OBESITY 12/08/2008  . 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 BACK PAIN 12/08/2008    Past Surgical History:  Procedure Laterality Date  . ABDOMINAL HYSTERECTOMY     2005  . ANKLE FRACTURE SURGERY Left   . arthroscopy of left knee  2008   for cartilage surgery  . CESAREAN SECTION     2 previous  . LEG SURGERY       OB History    Gravida  3   Para  2   Term  2   Preterm      AB  1   Living  2     SAB      TAB  1   Ectopic      Multiple      Live Births               Home Medications    Prior to Admission medications   Medication Sig Start Date End Date Taking? Authorizing Provider  hydrochlorothiazide (MICROZIDE) 12.5 MG capsule Take 12.5 mg by mouth daily.    [provider]  multivitamin-iron-minerals-folic acid  (CENTRUM) chewable tablet Chew 1 tablet by mouth daily.    [provider]  naproxen (NAPROSYN) 375 MG tablet Take 1 tablet twice daily as needed for chest wall pain or fever. 06/19/18   Molpus, John, MD    Family History Family History  Problem Relation Age of Onset  . Diabetes Mother   . Hypertension Mother   . Lymphoma Brother     Social History Social History   Tobacco Use  . Smoking status: Former Smoker    Packs/day: 0.25  . Smokeless tobacco: Never Used  Substance Use Topics  . Alcohol use: Yes    Comment: occassionally  . Drug use: No     Allergies   Patient has no known allergies.   Review of Systems Review of Systems  HENT: Negative for trouble swallowing and voice change.   Respiratory: Negative for shortness of breath.   Gastrointestinal: Negative for vomiting.  Skin: Positive for rash.     Physical Exam Updated Vital Signs BP 102/87 (BP Location: Right Arm)   Pulse 75   Temp 98.1 F (36.7 C) (Oral)   Resp 18  Ht 4\' 11"  (1.499 m)   Wt 106.6 kg   SpO2 99%   BMI 47.46 kg/m   Physical Exam Vitals signs and nursing note reviewed.  Constitutional:      Appearance: She is well-developed. She is obese.  HENT:     Head: Normocephalic and atraumatic.     Comments: No oropharyngeal swelling or lip/tongue swelling. Normal phonation    Right Ear: External ear normal.     Left Ear: External ear normal.     Nose: Nose normal.  Eyes:     General:        Right eye: No discharge.        Left eye: No discharge.  Cardiovascular:     Rate and Rhythm: Normal rate and regular rhythm.     Heart sounds: Normal heart sounds.  Pulmonary:     Effort: Pulmonary effort is normal.     Breath sounds: Normal breath sounds. No stridor. No wheezing.  Abdominal:     General: There is no distension.  Skin:    General: Skin is warm and dry.     Findings: Rash present.     Comments: Faint erythema, especially anterior upper chest. Mildly on back and maybe  left forearm  Neurological:     Mental Status: She is alert.  Psychiatric:        Mood and Affect: Mood is not anxious.      ED Treatments / Results  Labs (all labs ordered are listed, but only abnormal results are displayed) Labs Reviewed - No data to display  EKG None  Radiology Ct Angio Chest Pe W/cm &/or Wo Cm  Result Date: 12/17/2018 CLINICAL DATA:  Chest pain. EXAM: CT ANGIOGRAPHY CHEST WITH CONTRAST TECHNIQUE: Multidetector CT imaging of the chest was performed using the standard protocol during bolus administration of intravenous contrast. Multiplanar CT image reconstructions and MIPs were obtained to evaluate the vascular anatomy. CONTRAST:  100mL OMNIPAQUE IOHEXOL 350 MG/ML SOLN COMPARISON:  CT scan of July 07, 2016. FINDINGS: Cardiovascular: Satisfactory opacification of the pulmonary arteries to the segmental level. No evidence of pulmonary embolism. Normal heart size. No pericardial effusion. Mediastinum/Nodes: No enlarged mediastinal, hilar, or axillary lymph nodes. Thyroid gland, trachea, and esophagus demonstrate no significant findings. Lungs/Pleura: Lungs are clear. No pleural effusion or pneumothorax. Upper Abdomen: No acute abnormality. Musculoskeletal: No chest wall abnormality. No acute or significant osseous findings. Review of the MIP images confirms the above findings. IMPRESSION: No definite evidence of pulmonary embolus. No definite abnormality seen in the chest. Electronically Signed   By: Lupita RaiderJames  Green Jr M.D.   On: 12/17/2018 12:31   Dg Chest Port 1 View  Result Date: 12/17/2018 CLINICAL DATA:  Chest pain EXAM: PORTABLE CHEST 1 VIEW COMPARISON:  06/19/2018 FINDINGS: The heart size and mediastinal contours are within normal limits. No focal consolidation, pleural effusion, or pneumothorax. The visualized skeletal structures are unremarkable. IMPRESSION: No acute cardiopulmonary process. Electronically Signed   By: Duanne GuessNicholas  Plundo M.D.   On: 12/17/2018 10:47     Procedures Procedures (including critical care time)  Medications Ordered in ED Medications  diphenhydrAMINE (BENADRYL) capsule 50 mg (has no administration in time range)     Initial Impression / Assessment and Plan / ED Course  I have reviewed the triage vital signs and the nursing notes.  Pertinent labs & imaging results that were available during my care of the patient were reviewed by me and considered in my medical decision making (see chart for details).  Patient appears well and has some relief with the hydrocortisone cream.  I think is reasonable to give her Benadryl and recommend she take this over the next couple days.  No signs/symptoms this is anaphylaxis.  Could be related to her IV dye but the timing is not quite obvious.  No other soaps or detergents.  I do not think steroids are necessary at this point unless she does not get better.  Discharged home with return precautions.  Final Clinical Impressions(s) / ED Diagnoses   Final diagnoses:  Allergic reaction, initial encounter    ED Discharge Orders    None       Pricilla LovelessGoldston, Sapna Padron, MD 12/18/18 1016

## 2018-12-18 NOTE — Discharge Instructions (Addendum)
If you develop significant cough, vomiting, trouble breathing or swallowing, change in your voice, or any other new/concerning symptoms then call 911 or return to the ER immediately.  Otherwise, take Benadryl, 25-50 mg every 4 hours as needed for itching or rash.

## 2018-12-19 DIAGNOSIS — T7840XD Allergy, unspecified, subsequent encounter: Secondary | ICD-10-CM | POA: Diagnosis not present

## 2018-12-19 DIAGNOSIS — I10 Essential (primary) hypertension: Secondary | ICD-10-CM | POA: Diagnosis not present

## 2019-01-01 ENCOUNTER — Ambulatory Visit (INDEPENDENT_AMBULATORY_CARE_PROVIDER_SITE_OTHER): Payer: BC Managed Care – PPO | Admitting: Allergy

## 2019-01-01 ENCOUNTER — Encounter: Payer: Self-pay | Admitting: Allergy

## 2019-01-01 ENCOUNTER — Other Ambulatory Visit: Payer: Self-pay

## 2019-01-01 VITALS — BP 128/88 | HR 76 | Temp 97.0°F | Resp 16 | Ht 59.0 in | Wt 237.6 lb

## 2019-01-01 DIAGNOSIS — L500 Allergic urticaria: Secondary | ICD-10-CM

## 2019-01-01 DIAGNOSIS — L299 Pruritus, unspecified: Secondary | ICD-10-CM | POA: Diagnosis not present

## 2019-01-01 DIAGNOSIS — J3089 Other allergic rhinitis: Secondary | ICD-10-CM

## 2019-01-01 MED ORDER — AZELASTINE HCL 0.1 % NA SOLN: 2.0000 | Freq: Two times a day (BID) | NASAL | 5 refills | Status: DC

## 2019-01-01 NOTE — Progress Notes (Signed)
New Patient Note  RE: Sally BasquesMichelle Reid MRN: [id number] DOB: 01/28/1961 Date of Office Visit: 01/01/2019  Referring provider: Lorenda IshiharaVaradarajan, Rupashree,* Primary care provider: Lorenda IshiharaVaradarajan, Rupashree, MD  Chief Complaint: allergic reaction   History of present illness: Sally BasquesMichelle Reid is a 58 y.o. female presenting today for consultation for allergic reaction.    She states she had an allergic reaction to something several weeks ago.  She went to [facility] for chest pains on 12/17/2018 and she had a chest angiogram with contrast around noon.  She states she was feeling fine after the study however she stated that the next morning around 2 AM she woke up feeling itchy and breaking out in a rash "every where".  She states she has never had a rash like this before.  This rash she states lasted for the next week before resolving.  She states the imaging study was the only thing different that day that she feels that the reaction was related to the contrast.  The CT scan did not show any evidence of PE. She recalls going walking in the park the morning of the study.  She remembers having eggs, corn beef hash and grits for breakfast.  She states she was already having chest pains at that time and she went to ED.   She started taking Centrum vitamins about 2 weeks before the had the imaging done.  This is the only new medication that she has been taking. She does not believe she was bit or stung.   She states a family member had been staying with her who was using dryer sheets that she normally doesn't use.  But she is no longer using the dryer sheets anymore as the family member is not staying with them anymore. She denies having any fevers, swelling, joint aches or pains.  She denies any preceding illnesses, no new foods.  Due to the rash and itching she went back to the ED and was recommended to take Benadryl and cortisone.  She states this did not really help the rash or the itch.  She does  report having episodic hives for years that is itchy bumps come and ago primarily on her arms.  This occurs about once a month.  She has not been able to identify any triggers of this.    She does reports nasal drainage with PND.  Does not take anything for this.   No history of asthma or eczema.    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  Review of systems: Review of Systems  Constitutional: Negative for chills, fever and malaise/fatigue.  HENT: Negative for congestion, ear discharge, nosebleeds, sinus pain and sore throat.   Eyes: Negative for pain, discharge and redness.  Respiratory: Negative for cough, sputum production, shortness of breath and wheezing.   Cardiovascular: Positive for chest pain.  Gastrointestinal: Negative for abdominal pain, constipation, diarrhea, nausea and vomiting.  Musculoskeletal: Negative for joint pain.  Skin: Positive for itching and rash.  Neurological: Negative for headaches.    All other systems negative unless noted above in HPI  Past medical history: Past Medical History:  Diagnosis Date  . Arthritis   . Hypertension     Past surgical history: Past Surgical History:  Procedure Laterality Date  . ABDOMINAL HYSTERECTOMY     2005  .  ANKLE FRACTURE SURGERY Left   . arthroscopy of left knee  2008   for cartilage surgery  . CESAREAN SECTION     2 previous  . LEG SURGERY      Family history:  Family History  Problem Relation Age of Onset  . Diabetes Mother   . Hypertension Mother   . Lymphoma Brother     Social history: She lives in a home with out carpeting with electric heating and central  cooling.  No pets in the home.  No concern for water damage, mildew or roaches in the home.  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  She does report a smoking history of [place] menthols 1 cigarette a  day and started when she was around [age] to 58 years old  Medication List: Allergies as of 01/01/2019   No Known Allergies     Medication List       Accurate as of January 01, 2019  2:07 PM. If you have any questions, ask your nurse or doctor.        Apple Cider Vinegar 500 MG Tabs Take 1 each by mouth.   azelastine 0.1 % nasal spray Commonly known as: ASTELIN Place 2 sprays into both nostrils 2 (two) times daily. Started by: Valeska Haislip Larose HiresPatricia Philippe Gang, MD   diphenhydrAMINE 25 MG tablet Commonly known as: BENADRYL Take 25 mg by mouth every 6 (six) hours as needed.   hydrochlorothiazide 12.5 MG capsule Commonly known as: MICROZIDE Take 12.5 mg by mouth daily.   multivitamin-iron-minerals-folic acid chewable tablet Chew 1 tablet by mouth daily.   naproxen 375 MG tablet Commonly known as: NAPROSYN Take 1 tablet twice daily as needed for chest wall pain or fever.       Known medication allergies: No Known Allergies   Physical examination: Blood pressure 128/88, pulse 76, temperature (!) 97 F (36.1 C), temperature source Temporal, resp. rate 16, height 4\' 11"  (1.499 m), weight 237 lb 9.6 oz (107.8 kg), SpO2 96 %.  General: Alert, interactive, in no acute distress. HEENT: PERRLA, TMs pearly gray, turbinates minimally edematous with clear discharge, post-pharynx non erythematous. Neck: Supple without lymphadenopathy. Lungs: Clear to auscultation without wheezing, rhonchi or rales. {no increased work of breathing. CV: Normal S1, S2 without murmurs. Abdomen: Nondistended, nontender. Skin: Warm and dry, without lesions or rashes. Extremities:  No clubbing, cyanosis or edema. Neuro:   Grossly intact.  Diagnositics/Labs:  Allergy testing: Environmental allergy skin prick testing is positive to dust mites. 10 food common allergy skin prick testing is negative. Allergy testing results were read and interpreted by provider, documented by clinical staff.   Assessment and  plan: Urticaria pruritus  - at this time etiology of hives is unknown at this time.  Less concerned symptoms related to contrast from your imaging study as typically reactions to contrast occur during the study or shortly after the study is finished within the hour or 2.      Hives can be caused by a variety of different triggers including illness/infection, foods, medications, stings, exercise, pressure, vibrations, extremes of temperature to name a few however majority of the time there is no identifiable trigger.    - while less concerned for reaction to contrast if you need to have any future imaging using contrast you can pre-treat beforehand to decrease risk of reaction.  Pre-treatment includes benadryl 50mg  1 hour prior to study and can also include prednisone 13 hour, 7-hour in 1 hour prior to study.   -  will obtain labwork for alpha gal allergy (red meat allergy) and tryptase level   - environmental allergy testing is positive to dust mites.  Allergen avoidance measures discussed and handouts provided   - recommend allergy regimen including long-acting antihistamine like Allegra 180mg , Zyrtec 10mg  or Xyzal 5mg  daily as needed.  May take additional dose for control of hives and itching.      - if antihistamines are not controlling hives or itching then add Pepcid 20mg  twice a day to your antihistamine regimen  Allergic rhinitis  - for nasal drainage and post-nasal drip recommend use of nasal antihistamine spray, Astelin 2 sprays each nostril twice a day as needed  - antihistamine as above  - if medication regimen if not effective consider course of allergen immunotherapy (allergy shots.  We also discussed the option of sublingual immunotherapy for dust mites.   Follow-up 4-6 months or sooner if needed   I appreciate the opportunity to take part in Mozella's care. Please do not hesitate to contact me with questions.  Sincerely,   Prudy Feeler, MD Allergy/Immunology Allergy and  [place] [facility]

## 2019-01-01 NOTE — Patient Instructions (Addendum)
Hives and Itching  - at this time etiology of hives and swelling is unknown.  Less concerned symptoms related to contrast from your imaging study as typically reactions to contrast occur during the study or shortly after the study is finished.      Hives can be caused by a variety of different triggers including illness/infection, foods, medications, stings, exercise, pressure, vibrations, extremes of temperature to name a few however majority of the time there is no identifiable trigger.    - while less concerned for reaction to contrast if you need to have any future imaging using contrast you can pre-treat beforehand to decrease risk of reaction.  Pre-treatment includes benadryl 50mg  1 hour prior to study and can also include prednisone prior to study   - will obtain labwork for alpha gal allergy (red meat allergy) and tryptase level   - environmental allergy testing is positive to dust mites.  Allergen avoidance measures discussed and handouts provided   - recommend allergy regimen including long-acting antihistamine like Allegra 180mg , Zyrtec 10mg  or Xyzal 5mg  daily as needed.  May take additional dose for control of hives and itching.      - if antihistamines are not controlling hives or itching then add Pepcid 20mg  twice a day to your antihistamine regimen  Allergies   - for nasal drainage and post-nasal drip recommend use of nasal antihistamine spray, Astelin 2 sprays each nostril twice a day as needed  - antihistamine as above  - if medication regimen if not effective consider course of allergen immunotherapy (allergy shots)    Follow-up 4-6 months or sooner if needed

## 2019-01-08 LAB — ALPHA-GAL PANEL
Alpha Gal IgE*: <0.1 kU/L (ref <0.10)
Beef (Bos spp) IgE: <0.1 kU/L (ref <0.35)
Class Interpretation: 0
Class Interpretation: 0
Class Interpretation: 0
Lamb/Mutton (Ovis spp) IgE: <0.1 kU/L (ref <0.35)
Pork (Sus spp) IgE: <0.1 kU/L (ref <0.35)

## 2019-01-08 LAB — TRYPTASE: Tryptase: 4.4 ug/L (ref 2.2–13.2)

## 2019-03-20 ENCOUNTER — Encounter (HOSPITAL_BASED_OUTPATIENT_CLINIC_OR_DEPARTMENT_OTHER): Payer: Self-pay | Admitting: *Deleted

## 2019-03-20 ENCOUNTER — Other Ambulatory Visit: Payer: Self-pay

## 2019-03-20 ENCOUNTER — Emergency Department (HOSPITAL_BASED_OUTPATIENT_CLINIC_OR_DEPARTMENT_OTHER)
Admission: EM | Admit: 2019-03-20 | Discharge: 2019-03-20 | Disposition: A | Discharge status: Home / Self Care | Payer: Self-pay | Attending: Emergency Medicine | Admitting: Emergency Medicine

## 2019-03-20 DIAGNOSIS — Z79899 Other long term (current) drug therapy: Secondary | ICD-10-CM | POA: Insufficient documentation

## 2019-03-20 DIAGNOSIS — Z87891 Personal history of nicotine dependence: Secondary | ICD-10-CM | POA: Insufficient documentation

## 2019-03-20 DIAGNOSIS — K649 Unspecified hemorrhoids: Secondary | ICD-10-CM | POA: Insufficient documentation

## 2019-03-20 DIAGNOSIS — M79605 Pain in left leg: Secondary | ICD-10-CM | POA: Insufficient documentation

## 2019-03-20 MED ORDER — HYDROCORTISONE ACETATE 25 MG RE SUPP: 25.0000 mg | Freq: Two times a day (BID) | RECTAL | 0 refills | Status: DC

## 2019-03-20 NOTE — ED Provider Notes (Signed)
[facility] EMERGENCY DEPARTMENT Provider Note   CSN: [id number] Arrival date & time: 03/20/19  9563     History   Chief Complaint Chief Complaint  Patient presents with   Rectal Discomfort    HPI Sally Reid is a 58 y.o. female.     HPI   58 year old female presenting for several day history of rectal pain, burning.  Pain worse with bowel movements.  States she has had multiple loose bowel movements recently.  She has minimal blood when she wipes only.  Not in the commode.  Denies any fevers.  She also reports that she has been having pain to the left leg.  It seems to be worse in the morning when she gets up and moves around.  Pain is constant.  She is tried no interventions for symptoms.  No recent falls or trauma.  Past Medical History:  Diagnosis Date   Arthritis    Hypertension     Patient Active Problem List   Diagnosis Date Noted   Multiple thyroid nodules 07/14/2018   Low TSH level 07/14/2018   Right lateral epicondylitis 03/19/2018   CHEST PAIN 04/21/2009   ELEVATED BP READING WITHOUT DX HYPERTENSION 04/21/2009   OBESITY 12/08/2008   CHRONIC MIGRAINE W/O AURA W/O INTRACTABLE W/O SM 12/08/2008   BACK PAIN 12/08/2008    Past Surgical History:  Procedure Laterality Date   ABDOMINAL HYSTERECTOMY     2005   ANKLE FRACTURE SURGERY Left    arthroscopy of left knee  2008   for cartilage surgery   CESAREAN SECTION     2 previous   LEG SURGERY       OB History    Gravida  3   Para  2   Term  2   Preterm      AB  1   Living  2     SAB      TAB  1   Ectopic      Multiple      Live Births               Home Medications    Prior to Admission medications   Medication Sig Start Date End Date Taking? Authorizing Provider  Apple Cider Vinegar 500 MG TABS Take 1 each by mouth.   Yes [provider]  hydrochlorothiazide (MICROZIDE) 12.5 MG capsule Take 12.5 mg by mouth daily.   Yes [provider]  azelastine (ASTELIN) 0.1 % nasal spray Place 2 sprays into both nostrils 2 (two) times daily. 01/01/19   Marcelyn Bruins, MD  diphenhydrAMINE (BENADRYL) 25 MG tablet Take 25 mg by mouth every 6 (six) hours as needed.    [provider]  hydrocortisone (ANUSOL-HC) 25 MG suppository Place 1 suppository (25 mg total) rectally 2 (two) times daily. 03/20/19   Oval Moralez S, PA-C  naproxen (NAPROSYN) 375 MG tablet Take 1 tablet twice daily as needed for chest wall pain or fever. 06/19/18   Molpus, John, MD    Family History Family History  Problem Relation Age of Onset   Diabetes Mother    Hypertension Mother    Lymphoma Brother     Social History Social History   Tobacco Use   Smoking status: Former Smoker    Packs/day: 0.25   Smokeless tobacco: Never Used  Substance Use Topics   Alcohol use: Yes    Comment: occassionally   Drug use: No     Allergies   Patient has  no known allergies.   Review of Systems Review of Systems  Constitutional: Negative for fever.  Gastrointestinal: Negative for constipation.       Rectal pain  Musculoskeletal:       Left thigh pain     Physical Exam Updated Vital Signs BP (!) 144/55 (BP Location: Right Arm)    Pulse 86    Temp 98.8 F (37.1 C) (Oral)    Resp 16    Ht 4\' 11"  (1.499 m)    Wt 105.4 kg    SpO2 99%    BMI 46.92 kg/m   Physical Exam Vitals signs and nursing note reviewed.  Constitutional:      General: She is not in acute distress.    Appearance: She is well-developed.  HENT:     Head: Normocephalic and atraumatic.  Eyes:     Conjunctiva/sclera: Conjunctivae normal.  Neck:     Musculoskeletal: Neck supple.  Cardiovascular:     Rate and Rhythm: Normal rate.  Pulmonary:     Effort: Pulmonary effort is normal.  Genitourinary:    Comments: Chaperone present. Pt has external nonthrombosed hemorrhoid to the left side of the anus that is TTP. No erythema, warmth or signs of  infection. No obvious internal hemorrhoids on DRE.  Musculoskeletal: Normal range of motion.     Comments: TTP to the left upper thigh that reproduces pain. No erythema, warmth, fluctuance or swelling to the LLE. FROM of the ble.   Skin:    General: Skin is warm and dry.  Neurological:     Mental Status: She is alert.      ED Treatments / Results  Labs (all labs ordered are listed, but only abnormal results are displayed) Labs Reviewed - No data to display  EKG None  Radiology No results found.  Procedures Procedures (including critical care time)  Medications Ordered in ED Medications - No data to display   Initial Impression / Assessment and Plan / ED Course  I have reviewed the triage vital signs and the nursing notes.  Pertinent labs & imaging results that were available during my care of the patient were reviewed by me and considered in my medical decision making (see chart for details).    Final Clinical Impressions(s) / ED Diagnoses   Final diagnoses:  Hemorrhoids, unspecified hemorrhoid type  Left leg pain   58 year old female presenting for evaluation of rectal pain and left leg pain.  On exam she has a nonthrombosed external hemorrhoid.  No signs of infection.  Will treat with Anusol and anti-inflammatories and sits baths.  Left leg pain seems consistent with muscle spasm of the quadriceps muscle.  Advised Tylenol, ibuprofen, warm compresses at home.  Low suspicion for DVT or other emergent pathology.  Advised to follow-up with PCP in 1 week and return to the ED for new or worsening symptoms.  She voiced understanding of plan and reasons to return.  All questions answered.  Patient stable discharge.  ED Discharge Orders         Ordered    hydrocortisone (ANUSOL-HC) 25 MG suppository  2 times daily     03/20/19 0955           Rodney Booze, PA-C 03/20/19 1000    Lennice Sites, DO 03/20/19 1018

## 2019-03-20 NOTE — Discharge Instructions (Signed)
You may alternate taking Tylenol and Ibuprofen as needed for pain control. You may take 400-600 mg of ibuprofen every 6 hours and [phone] mg of Tylenol every 6 hours. Do not exceed 4000 mg of Tylenol daily as this can lead to liver damage. Also, make sure to take Ibuprofen with meals as it can cause an upset stomach. Do not take other NSAIDs while taking Ibuprofen such as (Aleve, Naprosyn, Aspirin, Celebrex, etc) and do not take more than the prescribed dose as this can lead to ulcers and bleeding in your GI tract. You may use warm and cold compresses to help with your symptoms.   Please do warm sitz paths at home at least 3 times per day for the next week. You were also given medications for your hemorrhoids, please use as directed.   Please follow up with your primary doctor within the next 7-10 days for re-evaluation and further treatment of your symptoms.   Please return to the ER sooner if you have any new or worsening symptoms.

## 2019-03-20 NOTE — ED Triage Notes (Signed)
Burning and itching sensation to bottom started 2 days ago.  She applied a hemorhoidal creme to her bottom without relief.

## 2019-04-13 ENCOUNTER — Encounter (HOSPITAL_BASED_OUTPATIENT_CLINIC_OR_DEPARTMENT_OTHER): Payer: Self-pay | Admitting: Emergency Medicine

## 2019-04-13 ENCOUNTER — Emergency Department (HOSPITAL_BASED_OUTPATIENT_CLINIC_OR_DEPARTMENT_OTHER)
Admission: EM | Admit: 2019-04-13 | Discharge: 2019-04-13 | Disposition: A | Discharge status: Home / Self Care | Payer: Self-pay | Attending: Emergency Medicine | Admitting: Emergency Medicine

## 2019-04-13 ENCOUNTER — Other Ambulatory Visit: Payer: Self-pay

## 2019-04-13 DIAGNOSIS — Z87891 Personal history of nicotine dependence: Secondary | ICD-10-CM | POA: Insufficient documentation

## 2019-04-13 DIAGNOSIS — R42 Dizziness and giddiness: Secondary | ICD-10-CM | POA: Insufficient documentation

## 2019-04-13 DIAGNOSIS — I10 Essential (primary) hypertension: Secondary | ICD-10-CM | POA: Insufficient documentation

## 2019-04-13 DIAGNOSIS — H6121 Impacted cerumen, right ear: Secondary | ICD-10-CM | POA: Insufficient documentation

## 2019-04-13 MED ORDER — MECLIZINE HCL 25 MG PO TABS: 25.0000 mg | ORAL_TABLET | Freq: Three times a day (TID) | ORAL | 0 refills | Status: DC | PRN

## 2019-04-13 MED ORDER — MECLIZINE HCL 25 MG PO TABS
25.0000 mg | ORAL_TABLET | Freq: Once | ORAL | Status: AC
  Administered 2019-04-13: 25 mg via ORAL
  Filled 2019-04-13: qty 1

## 2019-04-13 MED ORDER — KETOROLAC TROMETHAMINE 30 MG/ML IJ SOLN
30.0000 mg | Freq: Once | INTRAMUSCULAR | Status: AC
  Administered 2019-04-13: 30 mg via INTRAMUSCULAR
  Filled 2019-04-13: qty 1

## 2019-04-13 MED ORDER — DIAZEPAM 5 MG PO TABS
5.0000 mg | ORAL_TABLET | Freq: Once | ORAL | Status: AC
  Administered 2019-04-13: 5 mg via ORAL
  Filled 2019-04-13: qty 1

## 2019-04-13 NOTE — ED Provider Notes (Signed)
[facility] EMERGENCY DEPARTMENT Provider Note   CSN: [id number] Arrival date & time: 04/13/19  8768     History   Chief Complaint Chief Complaint  Patient presents with  . Otalgia  . Shortness of Breath    HPI Sally Reid is a 58 y.o. female.     HPI  This is a 58 year old female with a history of hypertension who presents with right ear pain and dizziness.  Patient reports onset of right ear pain this morning when she woke up.  She states that her pain is 8 out of 10.  She has not taken anything for the pain.  She thought she was getting a sinus infection yesterday but has not had any fevers.  She did use a Q-tip in her ear last night to try to clean her ears out.  She reports that this morning she has developed room spinning dizziness and lightheadedness.  She reports nausea without vomiting.  No known history of vertigo.  Denies fevers, chest pain, shortness of breath, abdominal pain.  Past Medical History:  Diagnosis Date  . Arthritis   . Hypertension     Patient Active Problem List   Diagnosis Date Noted  . Multiple thyroid nodules 07/14/2018  . Low TSH level 07/14/2018  . Right lateral epicondylitis 03/19/2018  . CHEST PAIN 04/21/2009  . ELEVATED BP READING WITHOUT DX HYPERTENSION 04/21/2009  . OBESITY 12/08/2008  . 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 BACK PAIN 12/08/2008    Past Surgical History:  Procedure Laterality Date  . ABDOMINAL HYSTERECTOMY     2005  . ANKLE FRACTURE SURGERY Left   . arthroscopy of left knee  2008   for cartilage surgery  . CESAREAN SECTION     2 previous  . LEG SURGERY       OB History    Gravida  3   Para  2   Term  2   Preterm      AB  1   Living  2     SAB      TAB  1   Ectopic      Multiple      Live Births               Home Medications    Prior to Admission medications   Medication Sig Start Date End Date Taking? Authorizing Provider  Apple Cider  Vinegar 500 MG TABS Take 1 each by mouth.    [provider]  azelastine (ASTELIN) 0.1 % nasal spray Place 2 sprays into both nostrils 2 (two) times daily. 01/01/19   Marcelyn Bruins, MD  diphenhydrAMINE (BENADRYL) 25 MG tablet Take 25 mg by mouth every 6 (six) hours as needed.    [provider]  hydrochlorothiazide (MICROZIDE) 12.5 MG capsule Take 12.5 mg by mouth daily.    [provider]  hydrocortisone (ANUSOL-HC) 25 MG suppository Place 1 suppository (25 mg total) rectally 2 (two) times daily. 03/20/19   Couture, Cortni S, PA-C  meclizine (ANTIVERT) 25 MG tablet Take 1 tablet (25 mg total) by mouth 3 (three) times daily as needed for dizziness. 04/13/19   Horton, Mayer Masker, MD  naproxen (NAPROSYN) 375 MG tablet Take 1 tablet twice daily as needed for chest wall pain or fever. 06/19/18   Molpus, John, MD    Family History Family History  Problem Relation Age of Onset  . Diabetes Mother   . Hypertension Mother   .  Lymphoma Brother     Social History Social History   Tobacco Use  . Smoking status: Former Smoker    Packs/day: 0.25  . Smokeless tobacco: Never Used  Substance Use Topics  . Alcohol use: Yes    Comment: occassionally  . Drug use: No     Allergies   Patient has no known allergies.   Review of Systems Review of Systems  Constitutional: Negative for fever.  HENT: Positive for ear pain. Negative for ear discharge.   Respiratory: Negative for shortness of breath.   Cardiovascular: Negative for chest pain.  Gastrointestinal: Positive for nausea and vomiting. Negative for abdominal pain and diarrhea.  Genitourinary: Negative for dysuria.  Neurological: Positive for dizziness. Negative for weakness and numbness.  All other systems reviewed and are negative.    Physical Exam Updated Vital Signs BP (!) 155/78 (BP Location: Right Arm)   Pulse 76   Temp 98.8 F (37.1 C) (Oral)   Resp 16   Ht 1.499 m (4\' 11" )   Wt 104.3 kg    SpO2 100%   BMI 46.45 kg/m   Physical Exam Vitals signs and nursing note reviewed.  Constitutional:      Appearance: She is well-developed. She is obese. She is not ill-appearing.  HENT:     Head: Normocephalic and atraumatic.     Right Ear: There is impacted cerumen.     Left Ear: Hearing and tympanic membrane normal.     Ears:     Comments: Repeat exam after cerumen removal, TM intact without effusion or significant erythema Eyes:     Pupils: Pupils are equal, round, and reactive to light.     Comments: nystagmus noted  Neck:     Musculoskeletal: Neck supple.  Cardiovascular:     Rate and Rhythm: Normal rate and regular rhythm.     Heart sounds: Normal heart sounds.  Pulmonary:     Effort: Pulmonary effort is normal. No respiratory distress.     Breath sounds: No wheezing.  Abdominal:     General: Bowel sounds are normal.     Palpations: Abdomen is soft.  Musculoskeletal:     Right lower leg: No edema.     Left lower leg: No edema.  Skin:    General: Skin is warm and dry.  Neurological:     Mental Status: She is alert and oriented to person, place, and time.     Comments: Cranial nerves II through XII intact, 5 out of 5 strength in all 4 extremities, no dysmetria to finger-nose-finger  Psychiatric:        Mood and Affect: Mood normal.      ED Treatments / Results  Labs (all labs ordered are listed, but only abnormal results are displayed) Labs Reviewed - No data to display  EKG None  Radiology No results found.  Procedures Procedures (including critical care time)  Medications Ordered in ED Medications  meclizine (ANTIVERT) tablet 25 mg (25 mg Oral Given 04/13/19 0605)  diazepam (VALIUM) tablet 5 mg (5 mg Oral Given 04/13/19 0605)  ketorolac (TORADOL) 30 MG/ML injection 30 mg (30 mg Intramuscular Given 04/13/19 0656)     Initial Impression / Assessment and Plan / ED Course  I have reviewed the triage vital signs and the nursing notes.  Pertinent  labs & imaging results that were available during my care of the patient were reviewed by me and considered in my medical decision making (see chart for details).  Patient presents with otalgia and had onset of dizziness this morning.  She is overall nontoxic and vital signs are reassuring.  She has cerumen impaction of the right ear.  Her description of her dizziness sounds like vertigo.  She is neurologically intact and I have low suspicion for central source.  Suspect that her cerumen impaction and otalgia may be contributing to her vertiginous symptoms.  Patient was given meclizine and Valium.  Her ear was irrigated.  TM is intact.  No obvious infection.  On recheck, patient states she feels somewhat better.  Ear pain has improved.  She does endorse some continued lightheadedness.  This could be secondary to the Valium.  She is ambulatory independently in the hallway without focal neurologic deficit.  Will discharge home with short course of meclizine.  She was instructed not to clean her ears with Q-tips any longer.  After history, exam, and medical workup I feel the patient has been appropriately medically screened and is safe for discharge home. Pertinent diagnoses were discussed with the patient. Patient was given return precautions.   Final Clinical Impressions(s) / ED Diagnoses   Final diagnoses:  Impacted cerumen of right ear  Vertigo    ED Discharge Orders         Ordered    meclizine (ANTIVERT) 25 MG tablet  3 times daily PRN     04/13/19 0702           Merryl Hacker, MD 04/13/19 [phone]

## 2019-04-13 NOTE — ED Triage Notes (Signed)
Reports right ear started hurting yesterday.  Noticed dizziness that started last night and SOB this morning.

## 2019-04-13 NOTE — Discharge Instructions (Addendum)
You were seen today for dizziness.  This is likely related to serum impaction in your right ear.  It is called vertigo.  Sometimes the particles in your ear can get off balance.  Do not stick anything smaller than your pinky finger in your ear.  Take ibuprofen as needed for pain.

## 2019-04-13 NOTE — ED Notes (Signed)
Pt ambulated in the hall. Pt stated that "she still felt dizzy while walking just not as bad."

## 2019-06-03 ENCOUNTER — Ambulatory Visit: Payer: BC Managed Care – PPO | Admitting: Allergy

## 2019-06-09 ENCOUNTER — Ambulatory Visit: Payer: BLUE CROSS/BLUE SHIELD | Admitting: Internal Medicine

## 2019-06-17 ENCOUNTER — Ambulatory Visit: Payer: Self-pay | Admitting: Internal Medicine

## 2019-06-17 NOTE — Progress Notes (Deleted)
Name: Sally Reid  MRN/ DOB: [id number], 1960/12/29    Age/ Sex: 59 y.o., female     PCP: Leeroy Cha, MD   Reason for Endocrinology Evaluation: Subclinical hyperthyroidism/MNG     Initial Endocrinology Clinic Visit: 07/14/2018    PATIENT IDENTIFIER: Sally Reid is a 59 y.o., female with a past medical history of migraine headaches and HTN. She has followed with [facility] since 07/14/2018 for consultative assistance with management of her subclinical hyperthyroidsim.   HISTORICAL SUMMARY: The patient was first diagnosed with MNG in January, 2020 after presenting to her PCP with a c/o "something stuck in her throat" this prompted a thyroid ultrasound which showed multiple thyroid nodules. Per history she also had a low TSH but these records are not available.   SUBJECTIVE:   During last visit (08/25/2018): Repeat TFT's were normal.   Today (06/17/2019):  Sally Reid is here for a 6 month follow up on hx of subclinical hyperthyroidism.  Denies local neck symptoms. She has lost some weight,but denies palpitations, or diarrhea. She has has occasional anxiety but no tremors.   Pt is not on biotin.    ROS:  As per HPI.   HISTORY:  Past Medical History:  Past Medical History:  Diagnosis Date  . Arthritis   . Hypertension    Past Surgical History:  Past Surgical History:  Procedure Laterality Date  . ABDOMINAL HYSTERECTOMY     2005  . ANKLE FRACTURE SURGERY Left   . arthroscopy of left knee  2008   for cartilage surgery  . CESAREAN SECTION     2 previous  . LEG SURGERY      Social History:  reports that she has quit smoking. She smoked 0.25 packs per day. She has never used smokeless tobacco. She reports current alcohol use. She reports that she does not use drugs. Family History:  Family History  Problem Relation Age of Onset  . Diabetes Mother   . Hypertension Mother   . Lymphoma Brother      HOME MEDICATIONS: Allergies as of  06/17/2019   No Known Allergies     Medication List       Accurate as of June 17, 2019 12:56 PM. If you have any questions, ask your nurse or doctor.        Apple Cider Vinegar 500 MG Tabs Take 1 each by mouth.   azelastine 0.1 % nasal spray Commonly known as: ASTELIN Place 2 sprays into both nostrils 2 (two) times daily.   diphenhydrAMINE 25 MG tablet Commonly known as: BENADRYL Take 25 mg by mouth every 6 (six) hours as needed.   hydrochlorothiazide 12.5 MG capsule Commonly known as: MICROZIDE Take 12.5 mg by mouth daily.   hydrocortisone 25 MG suppository Commonly known as: ANUSOL-HC Place 1 suppository (25 mg total) rectally 2 (two) times daily.   meclizine 25 MG tablet Commonly known as: ANTIVERT Take 1 tablet (25 mg total) by mouth 3 (three) times daily as needed for dizziness.   naproxen 375 MG tablet Commonly known as: NAPROSYN Take 1 tablet twice daily as needed for chest wall pain or fever.         OBJECTIVE:   PHYSICAL EXAM: VS: There were no vitals taken for this visit.   EXAM: General: Pt appears well and is in NAD  Eyes: External eye exam normal without stare, lid lag or exophthalmos.  EOM intact.    Ears, Nose, Throat: Hearing: Grossly intact bilaterally Dental: Good dentition  Throat: Clear without mass, erythema or exudate  Neck: General: Supple without adenopathy. Thyroid: Thyroid size normal.  No goiter or nodules appreciated. No thyroid bruit.  Lungs: Clear with good BS bilat with no rales, rhonchi, or wheezes  Heart: Auscultation: RRR.  Abdomen: Normoactive bowel sounds, soft, nontender, without masses or organomegaly palpable  Extremities:  BL LE: No pretibial edema normal ROM and strength.  Skin: Hair: Texture and amount normal with gender appropriate distribution Skin Inspection: No rashes, skin tags noted over the neck area.  Skin Palpation: Skin temperature, texture, and thickness normal to palpation  Mental Status: Judgment,  insight: Intact Orientation: Oriented to time, place, and person Mood and affect: No depression, anxiety, or agitation     DATA REVIEWED:     ASSESSMENT / PLAN / RECOMMENDATIONS:   1. Multiple Thyroid Nodules :  - Pt is clinically euthyroid  - No local neck symptoms - Repeat TFT's have been normal.  - Will repeat thyroid ultrasound in January, 2021 based on TI-RADS criteria for the right mid thyroid nodule.    F/u in 10 months      Signed electronically by: Lyndle Herrlich, MD  [facility]  [facility] [address] [place] [place], [place] [address] Phone: [phone] FAX: [phone]      CC: Lorenda Ishihara, MD [address] [place] [address] Phone: [phone]  Fax: [phone]   Return to Endocrinology clinic as below: Future Appointments  Date Time Provider Department Center  06/17/2019  2:40 PM Zoria Rawlinson, Konrad Dolores, MD [facility] None

## 2019-07-30 ENCOUNTER — Ambulatory Visit: Payer: Self-pay | Attending: Internal Medicine

## 2019-07-30 DIAGNOSIS — Z23 Encounter for immunization: Secondary | ICD-10-CM | POA: Insufficient documentation

## 2019-07-30 NOTE — Progress Notes (Signed)
   Covid-19 Vaccination Clinic  Name:  Sally Reid    MRN: [id number] DOB: 1960-10-17  07/30/2019  Sally Reid was observed post Covid-19 immunization for 15 minutes without incidence. She was provided with Vaccine Information Sheet and instruction to access the V-Safe system.   Sally Reid was instructed to call 911 with any severe reactions post vaccine: Marland Kitchen Difficulty breathing  . Swelling of your face and throat  . A fast heartbeat  . A bad rash all over your body  . Dizziness and weakness    Immunizations Administered    Name Date Dose VIS Date Route   Pfizer COVID-19 Vaccine 07/30/2019 12:38 PM 0.3 mL 05/15/2019 Intramuscular   Manufacturer: [employer], [employer]   Lot: [id number]   NDC: [address]-5

## 2019-08-26 ENCOUNTER — Ambulatory Visit: Payer: Self-pay | Attending: Internal Medicine

## 2019-08-26 DIAGNOSIS — Z23 Encounter for immunization: Secondary | ICD-10-CM

## 2019-08-26 NOTE — Progress Notes (Signed)
   Covid-19 Vaccination Clinic  Name:  Sally Reid    MRN: [id number] DOB: Jan 20, 1961  08/26/2019  [name] was observed post Covid-19 immunization for 15 minutes without incident. She was provided with Vaccine Information Sheet and instruction to access the V-Safe system.   [name] was instructed to call 911 with any severe reactions post vaccine: Marland Kitchen Difficulty breathing  . Swelling of face and throat  . A fast heartbeat  . A bad rash all over body  . Dizziness and weakness   Immunizations Administered    Name Date Dose VIS Date Route   Pfizer COVID-19 Vaccine 08/26/2019  9:06 AM 0.3 mL 05/15/2019 Intramuscular   Manufacturer: [employer], [employer]   Lot: [id number]   NDC: [address]-3

## 2020-01-25 IMAGING — US US THYROID
1 series · 13 of 25 positions shown · non-contrast
Comparison: None.

CLINICAL DATA: Hyperthyroid.

EXAM:
THYROID ULTRASOUND
TECHNIQUE: Ultrasound examination of the thyroid gland and adjacent soft
tissues was performed.

[Series 1: us thyroid · 0.07mm/px · 13 of 55 slices shown]
[im 1/55]
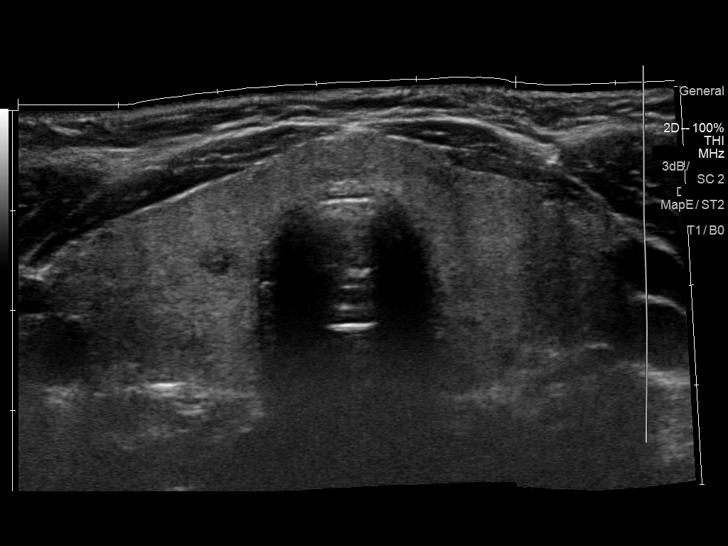
[im 5/55]
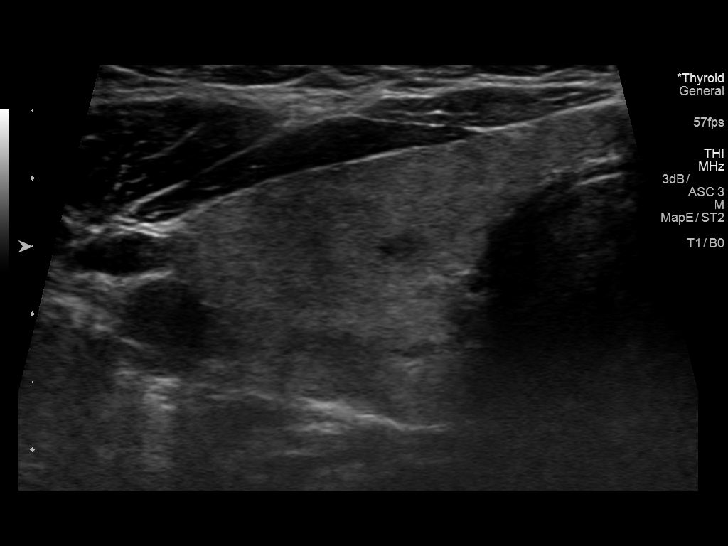
[im 10/55]
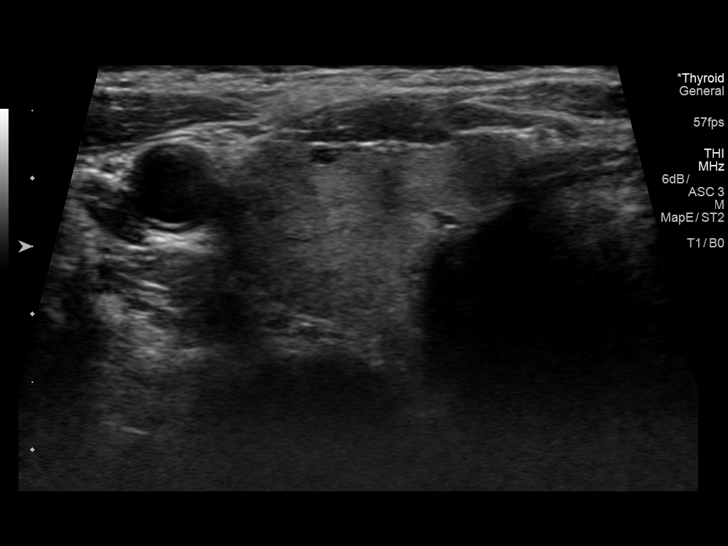
[im 14/55]
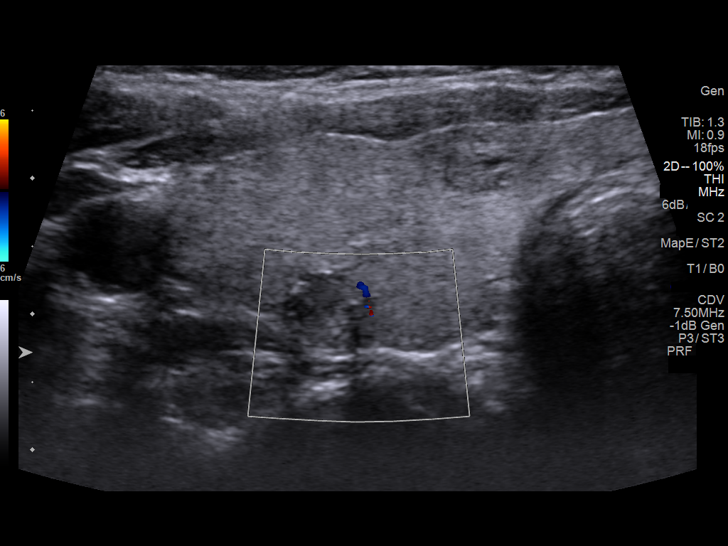
[im 19/55]
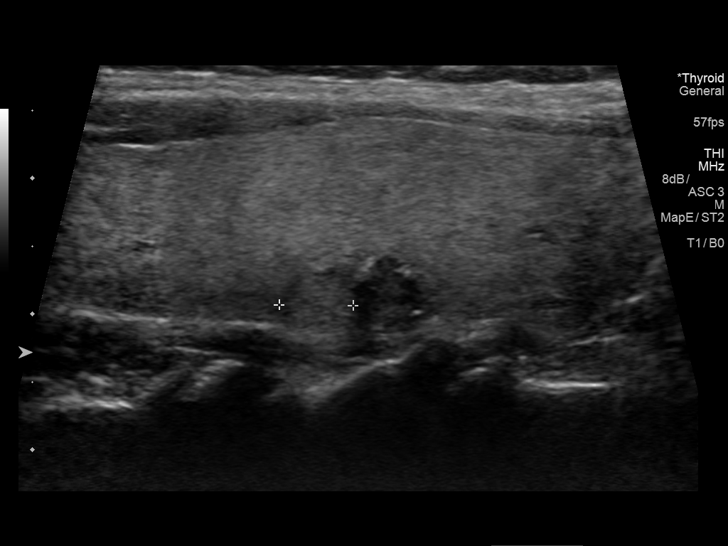
[im 23/55]
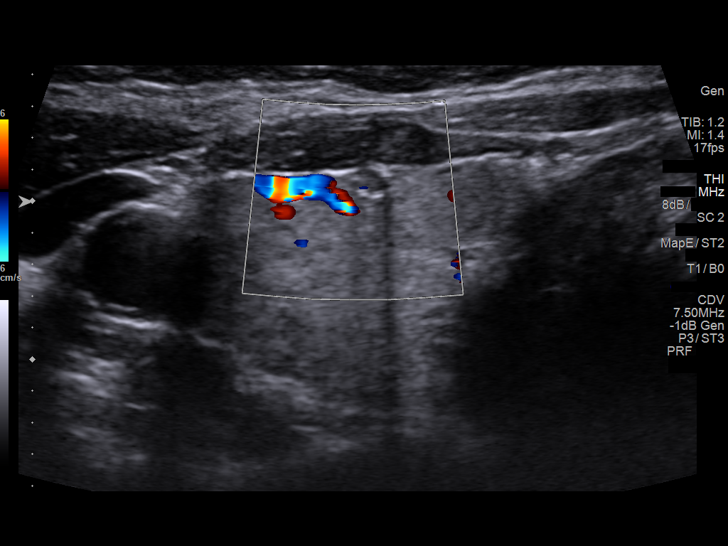
[im 28/55]
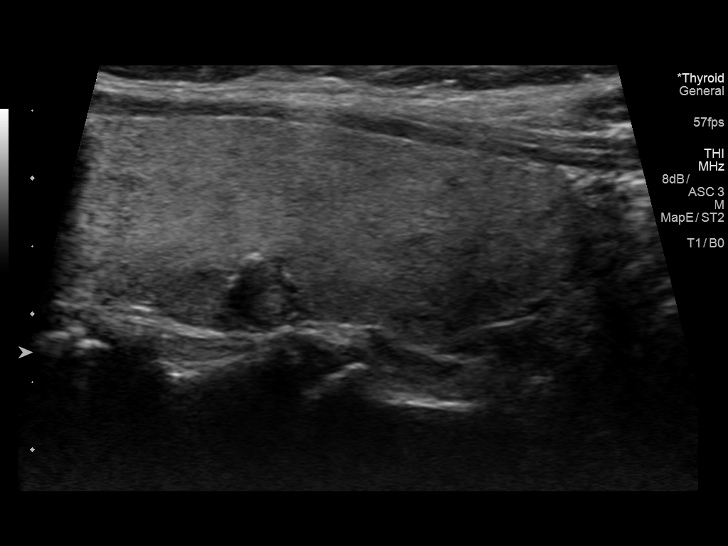
[im 32/55]
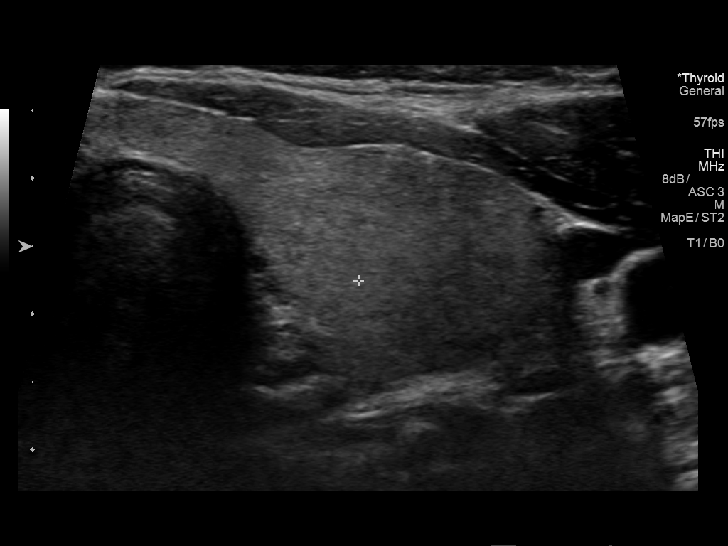
[im 37/55]
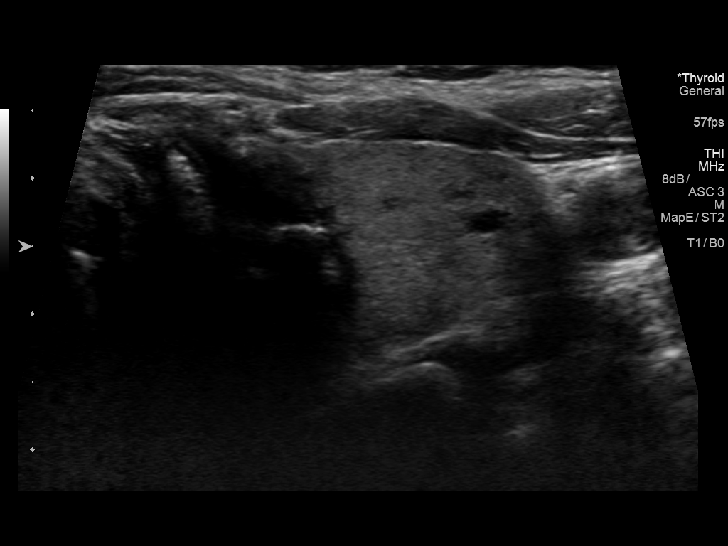
[im 41/55]
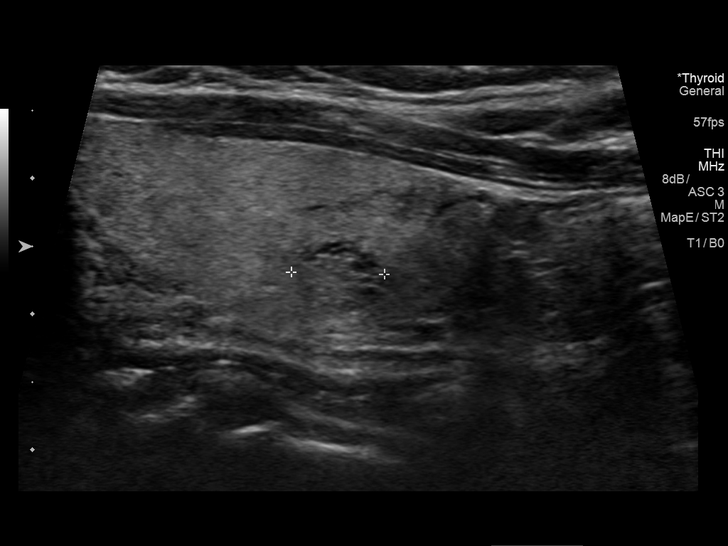
[im 46/55]
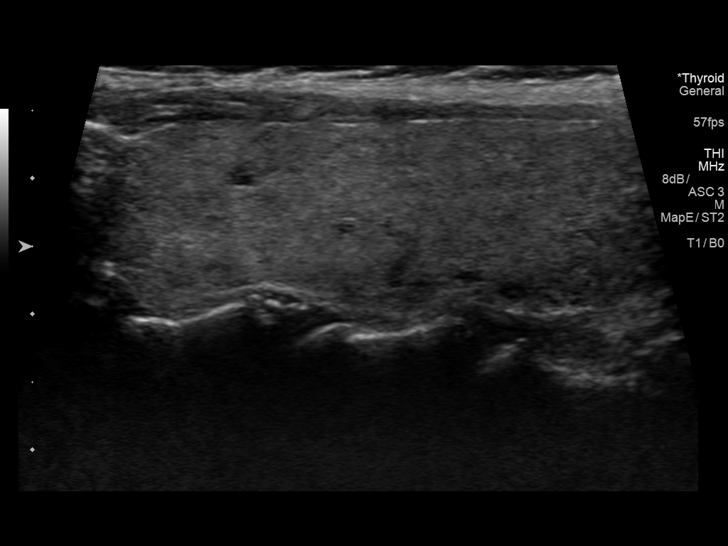
[im 50/55]
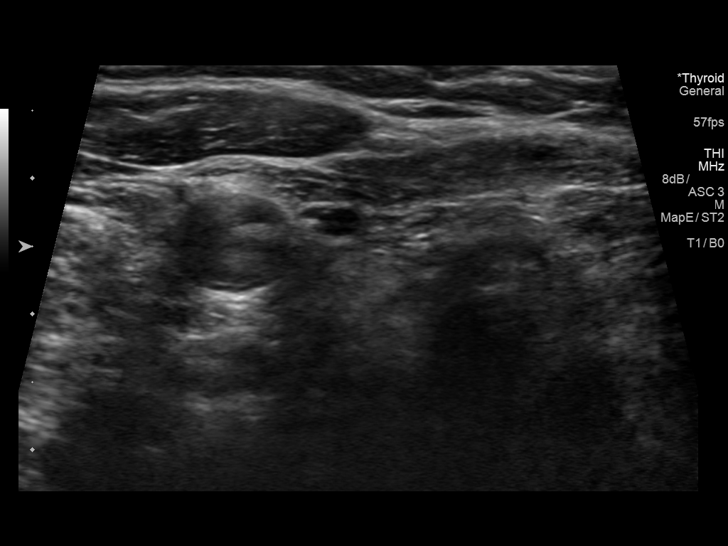
[im 55/55]
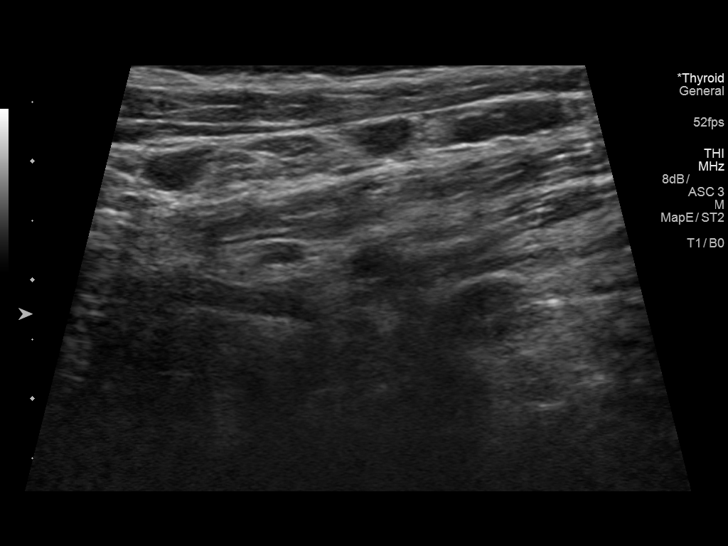

[13 of 25 positions shown; findings below may reference images not displayed]

FINDINGS: Parenchymal Echotexture: Mildly heterogenous

Isthmus: 0.5 cm

Right lobe: 4.9 x 1.7 x 2.3 cm

Left lobe: 4.6 x 1.2 x 2.3 cm

_________________________________________________________

Estimated total number of nodules >/= 1 cm: 1

Number of spongiform nodules >/=  2 cm not described below (TR1): 0

Number of mixed cystic and solid nodules >/= 1.5 cm not described
below (TR2): 0

_________________________________________________________

Nodule # 3:

Location: Right; Mid

Maximum size: 1.2 cm; Other 2 dimensions: 1.1 x 0.5 cm

Composition: solid/almost completely solid (2)

Echogenicity: hypoechoic (2)

Shape: not taller-than-wide (0)

Margins: smooth (0)

Echogenic foci: none (0)

ACR TI-RADS total points: 4.

ACR TI-RADS risk category: TR4 (4-6 points).

ACR TI-RADS recommendations:

*Given size (>/= 1 - 1.4 cm) and appearance, a follow-up ultrasound
in 1 year should be considered based on TI-RADS criteria.

_________________________________________________________

Additional scattered subcentimeter hypoechoic cysts and nodules
present bilaterally which do not warrant further evaluation or
follow-up.
IMPRESSION: Mildly enlarged and heterogeneous thyroid gland with multiple
thyroid nodules.

The 1.1 cm moderately suspicious (TI-RADS category 4) nodule in the
right mid gland (labeled # 3) meets criteria for follow-up
ultrasound in 1 year.

The above is in keeping with the ACR TI-RADS recommendations - [HOSPITAL] 5975;[DATE].

## 2020-03-14 ENCOUNTER — Encounter (HOSPITAL_BASED_OUTPATIENT_CLINIC_OR_DEPARTMENT_OTHER): Payer: Self-pay | Admitting: Emergency Medicine

## 2020-03-14 ENCOUNTER — Emergency Department (HOSPITAL_BASED_OUTPATIENT_CLINIC_OR_DEPARTMENT_OTHER): Payer: Self-pay

## 2020-03-14 ENCOUNTER — Emergency Department (HOSPITAL_BASED_OUTPATIENT_CLINIC_OR_DEPARTMENT_OTHER)
Admission: EM | Admit: 2020-03-14 | Discharge: 2020-03-14 | Disposition: A | Discharge status: Home / Self Care | Payer: Self-pay | Attending: Emergency Medicine | Admitting: Emergency Medicine

## 2020-03-14 ENCOUNTER — Other Ambulatory Visit (HOSPITAL_BASED_OUTPATIENT_CLINIC_OR_DEPARTMENT_OTHER): Payer: Self-pay | Admitting: Emergency Medicine

## 2020-03-14 ENCOUNTER — Other Ambulatory Visit: Payer: Self-pay

## 2020-03-14 ENCOUNTER — Emergency Department (HOSPITAL_COMMUNITY)
Admission: RE | Admit: 2020-03-14 | Discharge: 2020-03-14 | Disposition: A | Discharge status: Home / Self Care | Payer: Self-pay | Source: Ambulatory Visit | Attending: Emergency Medicine | Admitting: Emergency Medicine

## 2020-03-14 DIAGNOSIS — R102 Pelvic and perineal pain: Secondary | ICD-10-CM

## 2020-03-14 DIAGNOSIS — Z79899 Other long term (current) drug therapy: Secondary | ICD-10-CM | POA: Insufficient documentation

## 2020-03-14 DIAGNOSIS — M5459 Other low back pain: Secondary | ICD-10-CM | POA: Insufficient documentation

## 2020-03-14 DIAGNOSIS — R1030 Lower abdominal pain, unspecified: Secondary | ICD-10-CM

## 2020-03-14 DIAGNOSIS — I10 Essential (primary) hypertension: Secondary | ICD-10-CM | POA: Insufficient documentation

## 2020-03-14 DIAGNOSIS — R1032 Left lower quadrant pain: Secondary | ICD-10-CM | POA: Insufficient documentation

## 2020-03-14 DIAGNOSIS — Z87891 Personal history of nicotine dependence: Secondary | ICD-10-CM | POA: Insufficient documentation

## 2020-03-14 LAB — CBC WITH DIFFERENTIAL/PLATELET
Abs Immature Granulocytes: 0.04 10*3/uL (ref 0.00–0.07)
Basophils Absolute: 0 10*3/uL (ref 0.0–0.1)
Basophils Relative: 0 %
Eosinophils Absolute: 0.1 10*3/uL (ref 0.0–0.5)
Eosinophils Relative: 1 %
HCT: 43.8 % (ref 36.0–46.0)
Hemoglobin: 14.6 g/dL (ref 12.0–15.0)
Immature Granulocytes: 1 %
Lymphocytes Relative: 49 %
Lymphs Abs: 3 10*3/uL (ref 0.7–4.0)
MCH: 29 pg (ref 26.0–34.0)
MCHC: 33.3 g/dL (ref 30.0–36.0)
MCV: 86.9 fL (ref 80.0–100.0)
Monocytes Absolute: 0.5 10*3/uL (ref 0.1–1.0)
Monocytes Relative: 9 %
Neutro Abs: 2.4 10*3/uL (ref 1.7–7.7)
Neutrophils Relative %: 40 %
Platelets: 212 10*3/uL (ref 150–400)
RBC: 5.04 MIL/uL (ref 3.87–5.11)
RDW: 13.1 % (ref 11.5–15.5)
WBC: 6 10*3/uL (ref 4.0–10.5)
nRBC: 0 % (ref 0.0–0.2)

## 2020-03-14 LAB — COMPREHENSIVE METABOLIC PANEL
ALT: 32 U/L (ref 0–44)
AST: 33 U/L (ref 15–41)
Albumin: 3.5 g/dL (ref 3.5–5.0)
Alkaline Phosphatase: 63 U/L (ref 38–126)
Anion gap: 11 (ref 5–15)
BUN: 14 mg/dL (ref 6–20)
CO2: 27 mmol/L (ref 22–32)
Calcium: 9.5 mg/dL (ref 8.9–10.3)
Chloride: 103 mmol/L (ref 98–111)
Creatinine, Ser: 0.89 mg/dL (ref 0.44–1.00)
GFR, Estimated: >60 mL/min (ref >60)
Glucose, Bld: 94 mg/dL (ref 70–99)
Potassium: 4.2 mmol/L (ref 3.5–5.1)
Sodium: 141 mmol/L (ref 135–145)
Total Bilirubin: 0.6 mg/dL (ref 0.3–1.2)
Total Protein: 7.5 g/dL (ref 6.5–8.1)

## 2020-03-14 LAB — URINALYSIS, ROUTINE W REFLEX MICROSCOPIC
Bilirubin Urine: NEGATIVE
Glucose, UA: NEGATIVE mg/dL
Hgb urine dipstick: NEGATIVE
Ketones, ur: NEGATIVE mg/dL
Nitrite: NEGATIVE
Protein, ur: NEGATIVE mg/dL
Specific Gravity, Urine: 1.025 (ref 1.005–1.030)
pH: 6 (ref 5.0–8.0)

## 2020-03-14 LAB — URINALYSIS, MICROSCOPIC (REFLEX)

## 2020-03-14 MED ORDER — IOHEXOL 300 MG/ML  SOLN
100.0000 mL | Freq: Once | INTRAMUSCULAR | Status: AC | PRN
  Administered 2020-03-14: 100 mL via INTRAVENOUS

## 2020-03-14 MED ORDER — TRAMADOL HCL 50 MG PO TABS: 50.0000 mg | ORAL_TABLET | Freq: Four times a day (QID) | ORAL | 0 refills | Status: DC | PRN

## 2020-03-14 MED ORDER — HYDROCHLOROTHIAZIDE 12.5 MG PO CAPS
12.5000 mg | ORAL_CAPSULE | Freq: Every day | ORAL | 1 refills | Status: DC
Start: 2020-03-14 — End: 2020-06-15

## 2020-03-14 MED ORDER — CEPHALEXIN 500 MG PO CAPS: 500.0000 mg | ORAL_CAPSULE | Freq: Two times a day (BID) | ORAL | 0 refills | Status: DC

## 2020-03-14 NOTE — ED Provider Notes (Signed)
[facility] EMERGENCY DEPARTMENT Provider Note   CSN: [id number] Arrival date & time: 03/14/20  0803     History Chief Complaint  Patient presents with   Flank Pain    bilateral    Sally Reid is a 59 y.o. female.  Patient is a 59 year old female who presents with lower back and abdominal pain.  She says it started about 3 to 4 days ago with some discomfort in her lower back but yesterday had some worsening pain in her lower abdomen.  Its in her suprapubic area and her left lower abdomen.  She says it hurts to lay on it.  She cannot lay on her belly.  She has had some nausea but no vomiting.  No diarrhea.  No change in bowels.  She has some urinary frequency but also says she has been drinking more water.  No other urinary symptoms.  No history of kidney stones.  No known fevers.  No radiation down her legs.        Past Medical History:  Diagnosis Date   Arthritis    Hypertension     Patient Active Problem List   Diagnosis Date Noted   Multiple thyroid nodules 07/14/2018   Low TSH level 07/14/2018   Right lateral epicondylitis 03/19/2018   CHEST PAIN 04/21/2009   ELEVATED BP READING WITHOUT DX HYPERTENSION 04/21/2009   OBESITY 12/08/2008   CHRONIC MIGRAINE W/O AURA W/O INTRACTABLE W/O SM 12/08/2008   BACK PAIN 12/08/2008    Past Surgical History:  Procedure Laterality Date   ABDOMINAL HYSTERECTOMY     2005   ANKLE FRACTURE SURGERY Left    arthroscopy of left knee  2008   for cartilage surgery   CESAREAN SECTION     2 previous   LEG SURGERY       OB History    Gravida  3   Para  2   Term  2   Preterm      AB  1   Living  2     SAB      TAB  1   Ectopic      Multiple      Live Births              Family History  Problem Relation Age of Onset   Diabetes Mother    Hypertension Mother    Lymphoma Brother     Social History   Tobacco Use   Smoking status: Former Smoker    Packs/day: 0.25    Smokeless tobacco: Never Used  Building services engineerVaping Use   Vaping Use: Never used  Substance Use Topics   Alcohol use: Yes    Comment: occassionally   Drug use: No    Home Medications Prior to Admission medications   Medication Sig Start Date End Date Taking? Authorizing Provider  Apple Cider Vinegar 500 MG TABS Take 1 each by mouth.    [provider]  azelastine (ASTELIN) 0.1 % nasal spray Place 2 sprays into both nostrils 2 (two) times daily. 01/01/19   Marcelyn BruinsPadgett, Shaylar Patricia, MD  cephALEXin (KEFLEX) 500 MG capsule Take 1 capsule (500 mg total) by mouth 2 (two) times daily. 03/14/20   Rolan BuccoBelfi, Nocholas Damaso, MD  diphenhydrAMINE (BENADRYL) 25 MG tablet Take 25 mg by mouth every 6 (six) hours as needed.    [provider]  hydrochlorothiazide (MICROZIDE) 12.5 MG capsule Take 1 capsule (12.5 mg total) by mouth daily. 03/14/20   Rolan BuccoBelfi, Keziah Avis, MD  hydrocortisone (ANUSOL-HC)  25 MG suppository Place 1 suppository (25 mg total) rectally 2 (two) times daily. 03/20/19   Couture, Cortni S, PA-C  meclizine (ANTIVERT) 25 MG tablet Take 1 tablet (25 mg total) by mouth 3 (three) times daily as needed for dizziness. 04/13/19   Horton, Mayer Masker, MD  naproxen (NAPROSYN) 375 MG tablet Take 1 tablet twice daily as needed for chest wall pain or fever. 06/19/18   Molpus, John, MD  traMADol (ULTRAM) 50 MG tablet Take 1 tablet (50 mg total) by mouth every 6 (six) hours as needed. 03/14/20   Rolan Bucco, MD    Allergies    Patient has no known allergies.  Review of Systems   Review of Systems  Constitutional: Negative for chills, diaphoresis, fatigue and fever.  HENT: Negative for congestion, rhinorrhea and sneezing.   Eyes: Negative.   Respiratory: Negative for cough, chest tightness and shortness of breath.   Cardiovascular: Negative for chest pain and leg swelling.  Gastrointestinal: Positive for abdominal pain and nausea. Negative for blood in stool, diarrhea and vomiting.  Genitourinary:  Positive for flank pain and frequency. Negative for difficulty urinating and hematuria.  Musculoskeletal: Positive for back pain. Negative for arthralgias.  Skin: Negative for rash.  Neurological: Negative for dizziness, speech difficulty, weakness, numbness and headaches.    Physical Exam Updated Vital Signs BP (!) 161/63    Pulse 68    Temp 99.3 F (37.4 C)    Resp 18    Ht 4\' 11"  (1.499 m)    Wt 99.3 kg    SpO2 100%    BMI 44.23 kg/m   Physical Exam Constitutional:      Appearance: She is well-developed.  HENT:     Head: Normocephalic and atraumatic.  Eyes:     Pupils: Pupils are equal, round, and reactive to light.  Cardiovascular:     Rate and Rhythm: Normal rate and regular rhythm.     Heart sounds: Normal heart sounds.  Pulmonary:     Effort: Pulmonary effort is normal. No respiratory distress.     Breath sounds: Normal breath sounds. No wheezing or rales.  Chest:     Chest wall: No tenderness.  Abdominal:     General: Bowel sounds are normal.     Palpations: Abdomen is soft.     Tenderness: There is abdominal tenderness (Tenderness to the suprapubic and left lower quadrant areas). There is no guarding or rebound.  Musculoskeletal:        General: Normal range of motion.     Cervical back: Normal range of motion and neck supple.     Comments: No palpable tenderness along the spine, there is some mild tenderness to the lower lumbar region bilaterally in the musculature  Lymphadenopathy:     Cervical: No cervical adenopathy.  Skin:    General: Skin is warm and dry.     Findings: No rash.  Neurological:     Mental Status: She is alert and oriented to person, place, and time.     ED Results / Procedures / Treatments   Labs (all labs ordered are listed, but only abnormal results are displayed) Labs Reviewed  URINALYSIS, ROUTINE W REFLEX MICROSCOPIC - Abnormal; Notable for the following components:      Result Value   Leukocytes,Ua SMALL (*)    All other  components within normal limits  URINALYSIS, MICROSCOPIC (REFLEX) - Abnormal; Notable for the following components:   Bacteria, UA FEW (*)    All other components within  normal limits  URINE CULTURE  COMPREHENSIVE METABOLIC PANEL  CBC WITH DIFFERENTIAL/PLATELET    EKG None  Radiology CT Abdomen Pelvis W Contrast  Result Date: 03/14/2020 CLINICAL DATA:  Flank pain over the last week, recently worsening. Question diverticulitis. EXAM: CT ABDOMEN AND PELVIS WITH CONTRAST TECHNIQUE: Multidetector CT imaging of the abdomen and pelvis was performed using the standard protocol following bolus administration of intravenous contrast. CONTRAST:  OMNIPAQUE IOHEXOL 300 MG/ML  SOLN COMPARISON:  12/06/2017 FINDINGS: Lower chest: Normal Hepatobiliary: Fatty change of the liver without focal lesion. No calcified gallstones. Pancreas: Normal Spleen: Normal Adrenals/Urinary Tract: Adrenal glands are normal. Kidneys are normal. Bladder is normal. Stomach/Bowel: Stomach and small intestine are normal. Appendix is normal. No diverticulosis or diverticulitis. No sign of bowel inflammation. Vascular/Lymphatic: Aortic atherosclerosis.  No aneurysm. Reproductive: Previous hysterectomy.  No pelvic mass. Other: No free fluid or air. Musculoskeletal: Facet osteoarthritis at L4-5 and L5-S1 that could be symptomatic. IMPRESSION: 1. No evidence of diverticulitis or other acute bowel pathology. 2. Fatty change of the liver. 3. Aortic atherosclerosis. 4. 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 Aortic Atherosclerosis (ICD10-I70.0). Electronically Signed   By: Paulina Fusi M.D.   On: 03/14/2020 09:37    Procedures Procedures (including critical care time)  Medications Ordered in ED Medications  iohexol (OMNIPAQUE) 300 MG/ML solution 100 mL (100 mLs Intravenous Contrast Given 03/14/20 1470)    ED Course  I have reviewed the triage vital signs and the nursing notes.  Pertinent labs & imaging  results that were available during my care of the patient were reviewed by me and considered in my medical decision making (see chart for details).    MDM Rules/Calculators/A&P                          Patient presents with lower abdominal pain.  Her labs are nonconcerning.  Her white count is normal.  Her urinalysis is equivocal for infection.  She has some vague urinary symptoms.  It was sent for culture.  She had a CT scan performed which shows no acute abnormality.  No signs of diverticulitis or appendicitis.  She is concerned about it being something in her pelvis because she cannot lie on her abdomen.  She denies any vaginal bleeding or discharge.  I did order a pelvic ultrasound but we do not have an ultrasound tech here today to perform this.  I gave her options of being transferred to another emergency department versus having me order it for tomorrow.  We have opted to order it for tomorrow.  I feel that this is reasonable.  I would doubt ovarian torsion.  She did not have a sudden onset of pain or significant tenderness on palpation of her abdomen.  She was discharged home in good condition.  She was given a prescription for tramadol and Keflex.  She also was given a refill on her hydrochlorothiazide.  She recently lost her insurance and has not been able to get back into [facility].  She was given a referral to [facility] health and wellness center.  She was given strict return precautions given that she does not have outpatient follow-up.   Final  Clinical Impression(s) / ED Diagnoses Final diagnoses:  Lower abdominal pain    Rx / DC Orders ED Discharge Orders         Ordered    US PELVIC DOPPLER (TORSION R/O OR MASS ARTERIAL FLOW)  03/14/20 1048    traMADol (ULTRAM) 50 MG tablet  Every 6 hours PRN        03/14/20 1048    hydrochlorothiazide (MICROZIDE) 12.5 MG capsule  Daily        03/14/20 1048    cephALEXin (KEFLEX) 500 MG capsule  2 times daily        03/14/20 1049             Rolan Bucco, MD 03/14/20 1055

## 2020-03-14 NOTE — ED Triage Notes (Signed)
Bilateral flank pain x 1 week , got worse 2 days ago , urinary frequency. Pain radiating to lower abd pain. deneis Hx kidney stone.

## 2020-03-14 NOTE — Discharge Instructions (Signed)
Follow-up tomorrow try to either have a recheck by your primary care doctor at [facility] or obtain follow-up at [facility].  Return here as needed if you have any worsening symptoms.

## 2020-03-14 NOTE — ED Notes (Signed)
Review D/C papers with pt, reviewed Rx with pt, pt states understanding, pt denies questions at this time. 

## 2020-03-14 NOTE — ED Notes (Signed)
Patient transported to room from CT via transport.

## 2020-03-14 NOTE — ED Notes (Signed)
Pt drinking contrast without difficulty, pt denies questions at this time.

## 2020-03-15 ENCOUNTER — Telehealth (HOSPITAL_BASED_OUTPATIENT_CLINIC_OR_DEPARTMENT_OTHER): Payer: Self-pay

## 2020-03-15 LAB — URINE CULTURE: Culture: <10000 — AB

## 2020-03-15 NOTE — Telephone Encounter (Signed)
Patient called to inquire about her [place] from yesterday. Reviewed results with Dr. Madilyn Hook MD who advises to tell patient that no cause for her pain was found on [place] and to come back for fevers, worsening pain, or vomiting. ED MD advises to otherwise follow up with patients OBGYN. Patient agrees, no other questions at this time.

## 2020-03-18 ENCOUNTER — Encounter: Payer: Self-pay | Admitting: Internal Medicine

## 2020-03-21 ENCOUNTER — Ambulatory Visit: Payer: Self-pay

## 2020-03-21 NOTE — Telephone Encounter (Signed)
Called and spoke with Sally Reid.  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 Can she still have the booster while on blood thinners? She was told that you can have the booster while on Blood thinner but that she should notify them at the vaccine site that she is on blood thinners. Sally Reid verbalized understanding.   Reason for Disposition . General information question, no triage required and triager able to answer question  Answer Assessment - Initial Assessment Questions 1. REASON FOR CALL or QUESTION: "What is your reason for calling today?" or "How can I best help you?" or "What question do you have that I can help answer?"     Can you be on bloos thinner and still get covid booster  Protocols used: INFORMATION ONLY CALL - NO TRIAGE-A-AH

## 2020-03-22 ENCOUNTER — Ambulatory Visit: Payer: Self-pay | Attending: Internal Medicine

## 2020-03-22 ENCOUNTER — Ambulatory Visit: Payer: Self-pay | Attending: Internal Medicine | Admitting: Internal Medicine

## 2020-03-22 ENCOUNTER — Other Ambulatory Visit: Payer: Self-pay

## 2020-03-22 ENCOUNTER — Encounter: Payer: Self-pay | Admitting: Internal Medicine

## 2020-03-22 VITALS — BP 139/85 | HR 54 | Resp 16 | Ht 59.0 in | Wt 223.8 lb

## 2020-03-22 DIAGNOSIS — R0989 Other specified symptoms and signs involving the circulatory and respiratory systems: Secondary | ICD-10-CM

## 2020-03-22 DIAGNOSIS — Z7689 Persons encountering health services in other specified circumstances: Secondary | ICD-10-CM

## 2020-03-22 DIAGNOSIS — R1030 Lower abdominal pain, unspecified: Secondary | ICD-10-CM

## 2020-03-22 DIAGNOSIS — I10 Essential (primary) hypertension: Secondary | ICD-10-CM

## 2020-03-22 DIAGNOSIS — K760 Fatty (change of) liver, not elsewhere classified: Secondary | ICD-10-CM

## 2020-03-22 DIAGNOSIS — Z23 Encounter for immunization: Secondary | ICD-10-CM

## 2020-03-22 DIAGNOSIS — Z8639 Personal history of other endocrine, nutritional and metabolic disease: Secondary | ICD-10-CM

## 2020-03-22 DIAGNOSIS — Z6841 Body Mass Index (BMI) 40.0 and over, adult: Secondary | ICD-10-CM

## 2020-03-22 DIAGNOSIS — R198 Other specified symptoms and signs involving the digestive system and abdomen: Secondary | ICD-10-CM

## 2020-03-22 MED ORDER — OMEPRAZOLE 20 MG PO CPDR: 20.0000 mg | DELAYED_RELEASE_CAPSULE | Freq: Every day | ORAL | 3 refills | Status: DC

## 2020-03-22 NOTE — Progress Notes (Signed)
   Covid-19 Vaccination Clinic  Name:  Sally Reid    MRN: [id number] DOB: 06/25/1960  03/22/2020  Sally Reid was observed post Covid-19 immunization for 15 minutes without incident. She was provided with Vaccine Information Sheet and instruction to access the V-Safe system.   Sally Reid was instructed to call 911 with any severe reactions post vaccine: . Difficulty breathing  . Swelling of face and throat  . A fast heartbeat  . A bad rash all over body  . Dizziness and weakness     

## 2020-03-22 NOTE — Progress Notes (Signed)
Patient ID: Sally BasquesMichelle Los, female    DOB: 05/03/1961  MRN: [id number]  CC: Hospitalization Follow-up (ED)   Subjective: Sally Reid is a 59 y.o. female who presents for new patient visit ER follow-up. Her concerns today include:  Patient with history of migraine, thyroid nodules last US 06/2018, HTN  Previous PCP was Dr. Loreta AveMann at [facility].  Last seen 2 yrs ago.  Loss insurance Presents for ER f/u for lower back pain and abdominal pain.  Seen 03/14/2020 with complaints of lower abdominal pain and lower back pain x3 to 4 days.  There was no associated nausea/vomiting or diarrhea.  She had low-grade temp of 99.3 in the emergency room and on exam was found to have tenderness to the suprapubic and left lower quadrant areas of the abdomen without guarding or rebound.  CBC normal, chemistry normal, urine positive for small amounts of leukocytes.  Urine culture subsequently grew less than 10,000 colonies and was classified as insignificant growth.  CAT scan of the abdomen/pelvis revealed no acute disease process.  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  Pelvic ultrasound showed hysterectomy status but the ovaries were not visualized.  She was given some Keflex for possible UTI and prescribed Tramadol to use PRN.  Patient tells me that she continued to have significant abdominal pain for 2 days after ER. Since then, pain has calm down, now like "slight nagging pain." Initially 9/10.  Now 3-4/10 across lower abdn No dysuria, more frequent urination because she drinks plenty water and is also on a diuretic No fever, N/V Was moving BM okay until Tramadol. But has not taken since Wed last wk.   C/o intermittent for solids of feeling like food or something rises up in her throat.  This has been going on for about 2 to 3 months and occurs about once every 2 weeks.  She is not sure whether it is acid reflux.  Denies any  burning in the epigastric area.  Denies any problems swallowing.  No hoarseness.  No weight loss unexplained.  Obesity: BMI is 45.  Admits that she can do better with eating habits and can be more active. 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  Last thyroid ultrasound was back in annual area of 2020.  She was noted to have mildly enlarged thyroid gland with multiple thyroid nodules.  There was a 1.1 cm moderately suspicious nodule in the right middle gland which warranted follow-up imaging in 1 year.  HM: Had COVID booster today so declined getting flu shot today.  She is agreeable to coming back to having it done.  Reports having a colonoscopy within the past 10 years.  She did sign a release for us to get records from previous PCP.  Past medical, social, family history and surgical history reviewed. Patient Active Problem List   Diagnosis Date Noted  . Multiple thyroid nodules 07/14/2018  . Low TSH level 07/14/2018  . Right lateral epicondylitis 03/19/2018  . CHEST PAIN 04/21/2009  . ELEVATED BP READING WITHOUT DX HYPERTENSION 04/21/2009  . OBESITY 12/08/2008  . 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 BACK PAIN 12/08/2008     Current Outpatient Medications on File Prior to Visit  Medication Sig Dispense Refill  . Apple Cider Vinegar 500 MG TABS Take 1 each by mouth.    Marland Kitchen. azelastine (ASTELIN) 0.1 % nasal spray Place 2  sprays into both nostrils 2 (two) times daily. 30 mL 5  . cephALEXin (KEFLEX) 500 MG capsule Take 1 capsule (500 mg total) by mouth 2 (two) times daily. 14 capsule 0  . diphenhydrAMINE (BENADRYL) 25 MG tablet Take 25 mg by mouth every 6 (six) hours as needed.    . hydrochlorothiazide (MICROZIDE) 12.5 MG capsule Take 1 capsule (12.5 mg total) by mouth daily. 30 capsule 1  . hydrocortisone (ANUSOL-HC) 25 MG suppository Place 1 suppository (25 mg total) rectally 2 (two) times daily. 12 suppository 0  . meclizine (ANTIVERT)  25 MG tablet Take 1 tablet (25 mg total) by mouth 3 (three) times daily as needed for dizziness. 30 tablet 0  . naproxen (NAPROSYN) 375 MG tablet Take 1 tablet twice daily as needed for chest wall pain or fever. 15 tablet 0  . traMADol (ULTRAM) 50 MG tablet Take 1 tablet (50 mg total) by mouth every 6 (six) hours as needed. 15 tablet 0   No current facility-administered medications on file prior to visit.    No Known Allergies  Social History   Socioeconomic History  . Marital status: Married    Spouse name: Not on file  . Number of children: Not on file  . Years of education: Not on file  . Highest education level: Not on file  Occupational History  . 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 Smoking status: Former Smoker    Packs/day: 0.25  . Smokeless tobacco: Never Used  Vaping Use  . Vaping Use: Never used  Substance and Sexual Activity  . Alcohol use: Yes    Comment: occassionally  . Drug use: No  . Sexual activity: Yes    Birth control/protection: Surgical  Other Topics Concern  . Not on file  Social History Narrative   Lives in [place].   Had 2 children( two boys 46 and 29).   Living with fiancee.   Social Determinants of Health   Financial Resource Strain:   . Difficulty of Paying Living Expenses: Not on file  Food Insecurity:   . Worried About Programme researcher, broadcasting/film/video in the Last Year: Not on file  . Ran Out of Food in the Last Year: Not on file  Transportation Needs:   . Lack of Transportation (Medical): Not on file  . Lack of Transportation (Non-Medical): Not on file  Physical Activity:   . Days of Exercise per Week: Not on file  . Minutes of Exercise per Session: Not on file  Stress:   . Feeling of Stress : Not on file  Social Connections:   . Frequency of Communication with Friends and Family: Not on file  . Frequency of Social Gatherings with Friends and Family: Not on file  . Attends Religious Services: Not on  file  . Active Member of Clubs or Organizations: Not on file  . Attends Banker Meetings: Not on file  . Marital Status: Not on file  Intimate Partner Violence:   . Fear of Current or Ex-Partner: Not on file  . Emotionally Abused: Not on file  . Physically Abused: Not on file  . Sexually Abused: Not on file    Family History  Problem Relation Age of Onset  . Diabetes Mother   . Hypertension Mother   . Lymphoma Brother     Past Surgical History:  Procedure Laterality Date  . ABDOMINAL HYSTERECTOMY     2005  . ANKLE FRACTURE SURGERY Left   .  arthroscopy of left knee  2008   for cartilage surgery  . CESAREAN SECTION     2 previous  . LEG SURGERY      ROS: Review of Systems Negative except as stated above  PHYSICAL EXAM: BP 139/85   Pulse (!) 54   Resp 16   Ht 4\' 11"  (1.499 m)   Wt 223 lb 12.8 oz (101.5 kg)   SpO2 98%   BMI 45.20 kg/m   Physical Exam Repeat blood pressure 132/80. General appearance - alert, well appearing, older African-American female and in no distress Mental status - normal mood, behavior, speech, dress, motor activity, and thought processes Mouth - mucous membranes moist, pharynx normal without lesions Neck - supple, no significant adenopathy Lymphatics - no palpable lymphadenopathy, no hepatosplenomegaly Chest - clear to auscultation, no wheezes, rales or rhonchi, symmetric air entry Heart - normal rate, regular rhythm, normal S1, S2, no murmurs, rubs, clicks or gallops Abdomen - soft, nontender, nondistended, no masses or organomegaly Extremities -no lower extremity edema  Depression screen [facility] 2/9 03/22/2020  Decreased Interest 0  Down, Depressed, Hopeless 0  PHQ - 2 Score 0    Depression screen [facility] 2/9 03/22/2020  Decreased Interest 0  Down, Depressed, Hopeless 0  PHQ - 2 Score 0     CMP Latest Ref Rng & Units 03/14/2020 12/17/2018 06/19/2018  Glucose 70 - 99 mg/dL 94 91 06/21/2018)  BUN 6 - 20 mg/dL 14 15 15     Creatinine 0.44 - 1.00 mg/dL 665(L 9.35  Sodium 135 - 145 mmol/L 141 139 138  Potassium 3.5 - 5.1 mmol/L 4.2 3.4(L) 3.7  Chloride 98 - 111 mmol/L 103 103 107  CO2 22 - 32 mmol/L 27 26 23   Calcium 8.9 - 10.3 mg/dL 9.5 9.2 9.1  Total Protein 6.5 - 8.1 g/dL 7.5 - -  Total Bilirubin 0.3 - 1.2 mg/dL 0.6 - -  Alkaline Phos 38 - 126 U/L 63 - -  AST 15 - 41 U/L 33 - -  ALT 0 - 44 U/L 32 - -   Lipid Panel     Component Value Date/Time   CHOL  04/10/2009 0205    152        ATP III CLASSIFICATION:  <200     mg/dL   Desirable  7.79  mg/dL   Borderline High     mg/dL   High          TRIG 90 04/10/2009 0205   HDL 53 04/10/2009 0205   CHOLHDL 2.9 04/10/2009 0205   VLDL 18 04/10/2009 0205   LDLCALC  04/10/2009 0205    81        Total Cholesterol/HDL:CHD Risk Coronary Heart Disease Risk Table                     Men   Women  1/2 Average Risk   3.4   3.3  Average Risk       5.0   4.4  2 X Average Risk   9.6   7.1  3 X Average Risk  23.4   11.0        Use the calculated Patient Ratio above and the CHD Risk Table to determine the patient's CHD Risk.        ATP III CLASSIFICATION (LDL):  <100     mg/dL   Optimal  13/12/2008  mg/dL   Near or Above  Optimal  130-159  mg/dL   Borderline  254-270  mg/dL   High  >623     mg/dL   Very High    CBC    Component Value Date/Time   WBC 6.0 03/14/2020 0837   RBC 5.04 03/14/2020 0837   HGB 14.6 03/14/2020 0837   HCT 43.8 03/14/2020 0837   PLT 212 03/14/2020 0837   MCV 86.9 03/14/2020 0837   MCH 29.0 03/14/2020 0837   MCHC 33.3 03/14/2020 0837   RDW 13.1 03/14/2020 0837   LYMPHSABS 3.0 03/14/2020 0837   MONOABS 0.5 03/14/2020 0837   EOSABS 0.1 03/14/2020 0837   BASOSABS 0.0 03/14/2020 0837    ASSESSMENT AND PLAN: 1. Encounter to establish care 2. Abdominal pain, lower Since this is getting better and work-up so far has been negative, we will observe for now.  3. Essential hypertension Close to goal.   Continue hydrochlorothiazide.  Discussed and encouraged DASH diet.  4. Globus sensation This may be reflux.  GERD precautions given including foods to avoid, trying to eat at least 2 to 3 hours before laying down at night and sleeping with the head slightly elevated.  We will give a trial of omeprazole.  If symptoms persist, we will need to do further work-up - omeprazole (PRILOSEC) 20 MG capsule; Take 1 capsule (20 mg total) by mouth daily.  Dispense: 30 capsule; Refill: 3  5. Fatty liver Discussed the importance of weight loss to prevent further progression of fatty liver.  6. History of thyroid nodule Patient will sign up for the orange card/cone discount card.  She will follow-up with me in about 6 weeks.  Once approved we will submit referral for repeat ultrasound.  7. Morbid obesity with BMI of 45.0-49.9, adult [facility]) Discussed importance of healthy eating habits and regular exercise.  Advised patient to eliminate sugary drinks from the diet, cut back on white carbohydrates, try to eat more white lean meat instead of red meat, incorporate fresh fruits and vegetables into the diet.  Encouraged to get in some form of moderate intensity exercise several days a week  8. Need for influenza vaccination We will hold off on giving this today since she just got her COVID booster shot.  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  Requested that she sign a release for [place] to get her medical records from previous PCP.    Patient was given the opportunity to ask questions.  Patient verbalized understanding of the plan and was able to repeat key elements of the plan.   No orders of the defined types were placed in this encounter.    Requested Prescriptions   Signed Prescriptions Disp Refills  . omeprazole (PRILOSEC) 20 MG capsule 30 capsule 3    Sig: Take 1 capsule (20 mg total) by mouth daily.    Return in about 6 weeks (around 05/03/2020) for Give appt  with [facility] in 2 wks for flu shot.  Sign release to get med records from [place].  Jonah Blue, MD, FACP

## 2020-03-22 NOTE — Progress Notes (Signed)
   Covid-19 Vaccination Clinic  Name:  Sally Reid    MRN: [id number] DOB: 1960/12/27  03/22/2020  [name] was observed post Covid-19 immunization for 15 minutes without incident. She was provided with Vaccine Information Sheet and instruction to access the V-Safe system.   [name] was instructed to call 911 with any severe reactions post vaccine: Marland Kitchen Difficulty breathing  . Swelling of face and throat  . A fast heartbeat  . A bad rash all over body  . Dizziness and weakness

## 2020-03-22 NOTE — Patient Instructions (Signed)

## 2020-03-23 DIAGNOSIS — R0989 Other specified symptoms and signs involving the circulatory and respiratory systems: Secondary | ICD-10-CM | POA: Insufficient documentation

## 2020-03-23 DIAGNOSIS — R198 Other specified symptoms and signs involving the digestive system and abdomen: Secondary | ICD-10-CM | POA: Insufficient documentation

## 2020-03-23 DIAGNOSIS — K760 Fatty (change of) liver, not elsewhere classified: Secondary | ICD-10-CM | POA: Insufficient documentation

## 2020-03-23 DIAGNOSIS — I10 Essential (primary) hypertension: Secondary | ICD-10-CM | POA: Insufficient documentation

## 2020-03-28 ENCOUNTER — Telehealth: Payer: Self-pay

## 2020-03-28 NOTE — Telephone Encounter (Signed)
Copied from CRM [id number]. Topic: General - Inquiry >> Mar 16, 2020  9:29 AM Sally Reid wrote: Patient scheduled for 03/22/2020 hospital follow up. Patient has no insurance and would like information regarding financial assistance. Also patient would like PCP to place annual [facility]amo orders.,

## 2020-03-31 NOTE — Telephone Encounter (Signed)
Called pt. Schedule establish care appt with PCP. Pt was notified about Financial Assistance Program. Pt will keep appts scheduled and will call the week after her Establish care appt is completed to schedule Fin. Assistance appt. Pt has application and will gather needed documents for that appt.

## 2020-04-05 ENCOUNTER — Ambulatory Visit: Payer: Self-pay | Admitting: Pharmacist

## 2020-05-05 ENCOUNTER — Ambulatory Visit: Payer: Self-pay | Admitting: Internal Medicine

## 2020-06-15 ENCOUNTER — Other Ambulatory Visit: Payer: Self-pay

## 2020-06-15 ENCOUNTER — Ambulatory Visit: Payer: Self-pay | Attending: Family Medicine | Admitting: Family Medicine

## 2020-06-15 DIAGNOSIS — I10 Essential (primary) hypertension: Secondary | ICD-10-CM

## 2020-06-15 MED ORDER — HYDROCHLOROTHIAZIDE 12.5 MG PO CAPS: 12.5000 mg | ORAL_CAPSULE | Freq: Every day | ORAL | 6 refills | Status: DC

## 2020-06-15 NOTE — Progress Notes (Signed)
Needs refill on BP medication

## 2020-06-15 NOTE — Progress Notes (Signed)
Virtual Visit via Telephone Note  I connected with Sally Reid, on 06/15/2020 at 8:52 AM by telephone due to the COVID-19 pandemic and verified that I am speaking with the correct person using two identifiers.   Consent: I discussed the limitations, risks, security and privacy concerns of performing an evaluation and management service by telephone and the availability of in person appointments. I also discussed with the patient that there may be a patient responsible charge related to this service. The patient expressed understanding and agreed to proceed.   Location of Patient: Home  Location of Provider: Home   Persons participating in Telemedicine visit: Somara Frymire Farrington-CMA Dr. Alvis Lemmings     History of Present Illness: 60 year old female with a history of Hypertension here for a follow up visit. She requests a refill of her Hydrochlorothiazide. Denies presence of dyspnea, chest pain. Just getting over a cold and states she feels better now. She has no additional concerns.   Past Medical History:  Diagnosis Date  . Arthritis   . Hypertension    No Known Allergies  Current Outpatient Medications on File Prior to Visit  Medication Sig Dispense Refill  . Apple Cider Vinegar 500 MG TABS Take 1 each by mouth.    Marland Kitchen azelastine (ASTELIN) 0.1 % nasal spray Place 2 sprays into both nostrils 2 (two) times daily. 30 mL 5  . cephALEXin (KEFLEX) 500 MG capsule Take 1 capsule (500 mg total) by mouth 2 (two) times daily. 14 capsule 0  . diphenhydrAMINE (BENADRYL) 25 MG tablet Take 25 mg by mouth every 6 (six) hours as needed.    . hydrocortisone (ANUSOL-HC) 25 MG suppository Place 1 suppository (25 mg total) rectally 2 (two) times daily. 12 suppository 0  . meclizine (ANTIVERT) 25 MG tablet Take 1 tablet (25 mg total) by mouth 3 (three) times daily as needed for dizziness. 30 tablet 0  . naproxen (NAPROSYN) 375 MG tablet Take 1 tablet twice daily as needed for chest  wall pain or fever. 15 tablet 0  . omeprazole (PRILOSEC) 20 MG capsule Take 1 capsule (20 mg total) by mouth daily. 30 capsule 3  . traMADol (ULTRAM) 50 MG tablet Take 1 tablet (50 mg total) by mouth every 6 (six) hours as needed. 15 tablet 0   No current facility-administered medications on file prior to visit.    Observations/Objective: Awake, alert, oriented x3 Not in acute distress    Assessment and Plan: 1. Essential hypertension Controlled - hydrochlorothiazide (MICROZIDE) 12.5 MG capsule; Take 1 capsule (12.5 mg total) by mouth daily.  Dispense: 30 capsule; Refill: 6   Follow Up Instructions: 3 months chronic disease management   I discussed the assessment and treatment plan with the patient. The patient was provided an opportunity to ask questions and all were answered. The patient agreed with the plan and demonstrated an understanding of the instructions.   The patient was advised to call back or seek an in-person evaluation if the symptoms worsen or if the condition fails to improve as anticipated.     I provided 11 minutes total of non-face-to-face time during this encounter including median intraservice time, reviewing previous notes, investigations, ordering medications, medical decision making, coordinating care and patient verbalized understanding at the end of the visit.     Hoy Register, MD, FAAFP. [facility] [place], [place] [phone]   06/15/2020, 8:52 AM

## 2020-06-22 ENCOUNTER — Ambulatory Visit: Payer: Self-pay

## 2020-07-01 ENCOUNTER — Other Ambulatory Visit: Payer: Self-pay

## 2020-07-01 ENCOUNTER — Ambulatory Visit: Payer: Self-pay | Attending: Family Medicine

## 2020-11-16 IMAGING — DX PORTABLE CHEST - 1 VIEW
1 series · 1 of 1 positions shown · non-contrast
Comparison: 06/19/2018

CLINICAL DATA: Chest pain

EXAM:
PORTABLE CHEST 1 VIEW

[chest ap]
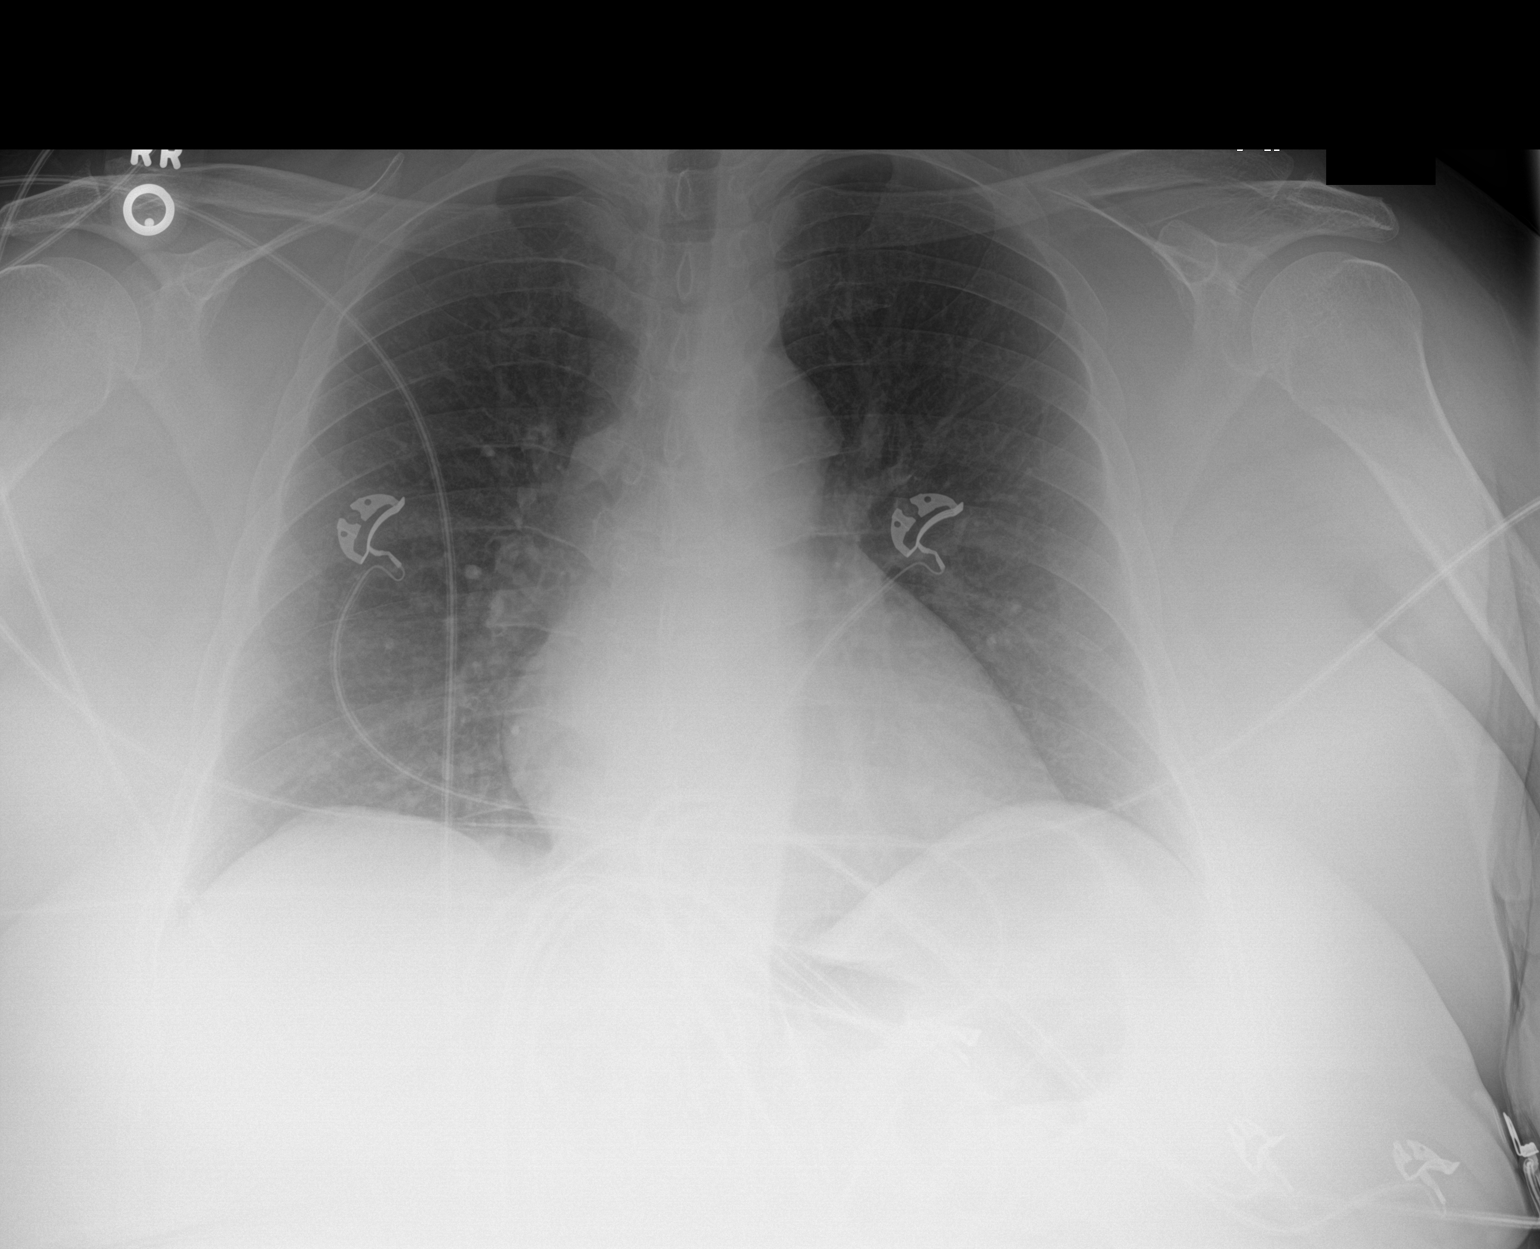

[1 of 1 positions shown; findings below may reference images not displayed]

FINDINGS: The heart size and mediastinal contours are within normal limits. No
focal consolidation, pleural effusion, or pneumothorax. The
visualized skeletal structures are unremarkable.
IMPRESSION: No acute cardiopulmonary process.

## 2020-11-16 IMAGING — CT CT ANGIOGRAPHY CHEST
1 of 8 series · 3 of 16 positions shown · IV contrast (omnipaque)
Comparison: CT scan of July 07, 2016.

CLINICAL DATA: Chest pain.

EXAM:
CT ANGIOGRAPHY CHEST WITH CONTRAST
TECHNIQUE: Multidetector CT imaging of the chest was performed using the
standard protocol during bolus administration of intravenous
contrast. Multiplanar CT image reconstructions and MIPs were
obtained to evaluate the vascular anatomy.
CONTRAST:  100mL OMNIPAQUE IOHEXOL 350 MG/ML SOLN

[Series 10: pe thins · axial · 0.62mm/px · z∈[-231,-105]mm · 3 of 252 slices shown]
[im 63/252  lung]
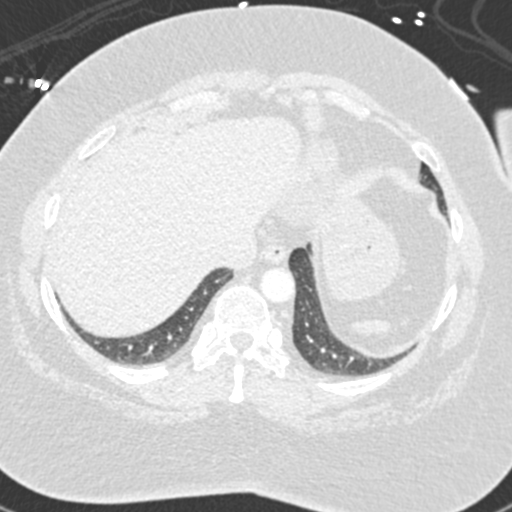
[im 126/252  soft-tissue]
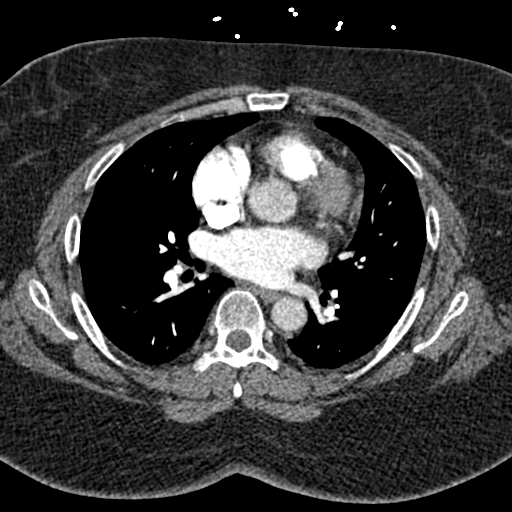
[im 189/252  lung]
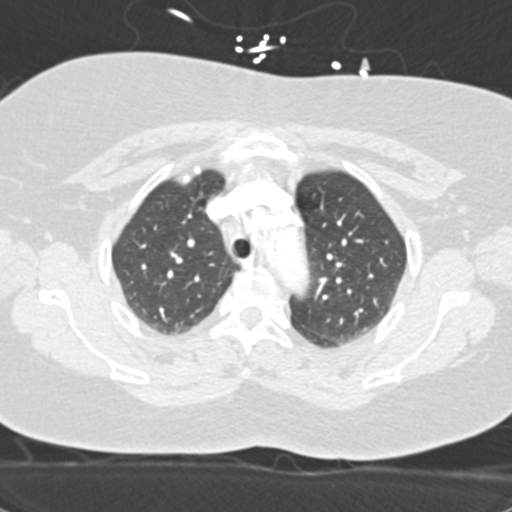

[3 of 16 positions shown; findings below may reference images not displayed]

FINDINGS: Cardiovascular: Satisfactory opacification of the pulmonary arteries
to the segmental level. No evidence of pulmonary embolism. Normal
heart size. No pericardial effusion.

Mediastinum/Nodes: No enlarged mediastinal, hilar, or axillary lymph
nodes. Thyroid gland, trachea, and esophagus demonstrate no
significant findings.

Lungs/Pleura: Lungs are clear. No pleural effusion or pneumothorax.

Upper Abdomen: No acute abnormality.

Musculoskeletal: No chest wall abnormality. No acute or significant
osseous findings.

Review of the MIP images confirms the above findings.
IMPRESSION: No definite evidence of pulmonary embolus. No definite abnormality
seen in the chest.

## 2021-04-04 ENCOUNTER — Encounter: Payer: Self-pay | Admitting: Internal Medicine

## 2021-04-07 ENCOUNTER — Encounter (HOSPITAL_BASED_OUTPATIENT_CLINIC_OR_DEPARTMENT_OTHER): Payer: Self-pay

## 2021-04-07 ENCOUNTER — Emergency Department (HOSPITAL_BASED_OUTPATIENT_CLINIC_OR_DEPARTMENT_OTHER): Payer: Self-pay

## 2021-04-07 ENCOUNTER — Emergency Department (HOSPITAL_BASED_OUTPATIENT_CLINIC_OR_DEPARTMENT_OTHER)
Admission: EM | Admit: 2021-04-07 | Discharge: 2021-04-08 | Disposition: A | Discharge status: Home / Self Care | Payer: Self-pay | Attending: Emergency Medicine | Admitting: Emergency Medicine

## 2021-04-07 ENCOUNTER — Other Ambulatory Visit: Payer: Self-pay

## 2021-04-07 DIAGNOSIS — M79605 Pain in left leg: Secondary | ICD-10-CM | POA: Insufficient documentation

## 2021-04-07 DIAGNOSIS — I10 Essential (primary) hypertension: Secondary | ICD-10-CM | POA: Insufficient documentation

## 2021-04-07 DIAGNOSIS — M79602 Pain in left arm: Secondary | ICD-10-CM

## 2021-04-07 DIAGNOSIS — I639 Cerebral infarction, unspecified: Secondary | ICD-10-CM | POA: Insufficient documentation

## 2021-04-07 DIAGNOSIS — Z79899 Other long term (current) drug therapy: Secondary | ICD-10-CM | POA: Insufficient documentation

## 2021-04-07 DIAGNOSIS — R200 Anesthesia of skin: Secondary | ICD-10-CM

## 2021-04-07 DIAGNOSIS — F17210 Nicotine dependence, cigarettes, uncomplicated: Secondary | ICD-10-CM | POA: Insufficient documentation

## 2021-04-07 LAB — COMPREHENSIVE METABOLIC PANEL
ALT: 40 U/L (ref 0–44)
AST: 40 U/L (ref 15–41)
Albumin: 3.5 g/dL (ref 3.5–5.0)
Alkaline Phosphatase: 75 U/L (ref 38–126)
Anion gap: 8 (ref 5–15)
BUN: 14 mg/dL (ref 6–20)
CO2: 26 mmol/L (ref 22–32)
Calcium: 9.3 mg/dL (ref 8.9–10.3)
Chloride: 105 mmol/L (ref 98–111)
Creatinine, Ser: 0.88 mg/dL (ref 0.44–1.00)
GFR, Estimated: >60 mL/min (ref >60)
Glucose, Bld: 84 mg/dL (ref 70–99)
Potassium: 3.9 mmol/L (ref 3.5–5.1)
Sodium: 139 mmol/L (ref 135–145)
Total Bilirubin: 0.4 mg/dL (ref 0.3–1.2)
Total Protein: 7.4 g/dL (ref 6.5–8.1)

## 2021-04-07 LAB — CBC WITH DIFFERENTIAL/PLATELET
Abs Immature Granulocytes: 0.02 10*3/uL (ref 0.00–0.07)
Basophils Absolute: 0 10*3/uL (ref 0.0–0.1)
Basophils Relative: 0 %
Eosinophils Absolute: 0.1 10*3/uL (ref 0.0–0.5)
Eosinophils Relative: 1 %
HCT: 41.6 % (ref 36.0–46.0)
Hemoglobin: 14 g/dL (ref 12.0–15.0)
Immature Granulocytes: 0 %
Lymphocytes Relative: 54 %
Lymphs Abs: 3.9 10*3/uL (ref 0.7–4.0)
MCH: 29.6 pg (ref 26.0–34.0)
MCHC: 33.7 g/dL (ref 30.0–36.0)
MCV: 87.9 fL (ref 80.0–100.0)
Monocytes Absolute: 0.7 10*3/uL (ref 0.1–1.0)
Monocytes Relative: 10 %
Neutro Abs: 2.5 10*3/uL (ref 1.7–7.7)
Neutrophils Relative %: 35 %
Platelets: 225 10*3/uL (ref 150–400)
RBC: 4.73 MIL/uL (ref 3.87–5.11)
RDW: 13.1 % (ref 11.5–15.5)
WBC: 7.2 10*3/uL (ref 4.0–10.5)
nRBC: 0 % (ref 0.0–0.2)

## 2021-04-07 MED ORDER — SODIUM CHLORIDE 0.9 % IV SOLN: INTRAVENOUS | Status: DC

## 2021-04-07 MED ORDER — ONDANSETRON HCL 4 MG/2ML IJ SOLN
4.0000 mg | Freq: Once | INTRAMUSCULAR | Status: AC
  Administered 2021-04-07: 4 mg via INTRAVENOUS
  Filled 2021-04-07: qty 2

## 2021-04-07 MED ORDER — HYDROMORPHONE HCL 1 MG/ML IJ SOLN
1.0000 mg | Freq: Once | INTRAMUSCULAR | Status: AC
  Administered 2021-04-07: 1 mg via INTRAVENOUS
  Filled 2021-04-07: qty 1

## 2021-04-07 NOTE — ED Provider Notes (Addendum)
[facility] Provider Note   CSN: [id number] Arrival date & time: 04/07/21  1517     History Chief Complaint  Patient presents with   Edema    Sally Reid is a 60 y.o. female.  Patient history of hypertension.  Claims she been out of her hypertensive meds for 2 days.  Blood pressure upon arrival was 170/58.  Patient with a complaint of swelling to the left upper extremity and pain in the left upper extremity and pain to the left lower extremity.  No falls or injury.  Patient states she had a mammogram 3 days ago with a rare breast finding.  But that certainly should not give her pain in the left lower extremity.  Patient denied any shortness of breath to me but did say to the nurses that had shortness of breath for 3 days.  No fever or flu symptoms.  Patient also without any chest pain abdominal pain nausea or vomiting.  Patient does not have a primary care provider      Past Medical History:  Diagnosis Date   Arthritis    Hypertension     Patient Active Problem List   Diagnosis Date Noted   Essential hypertension 03/23/2020   Globus sensation 03/23/2020   Fatty liver 03/23/2020   Morbid obesity with BMI of 45.0-49.9, adult (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 W/O SM 12/08/2008   BACK PAIN 12/08/2008    Past Surgical History:  Procedure Laterality Date   ABDOMINAL HYSTERECTOMY     2005   ANKLE FRACTURE SURGERY Left    arthroscopy of left knee  2008   for cartilage surgery   CESAREAN SECTION     2 previous   LEG SURGERY       OB History     Gravida  3   Para  2   Term  2   Preterm      AB  1   Living  2      SAB      IAB  1   Ectopic      Multiple      Live Births               Family History  Problem Relation Age of Onset   Diabetes Mother    Hypertension Mother    Lymphoma Brother     Social History   Tobacco Use   Smoking status: Some Days    Packs/day: 0.25    Types: Cigarettes   Smokeless tobacco: Never  Vaping Use   Vaping Use: Never used  Substance Use Topics   Alcohol use: Yes    Comment: occassionally   Drug use: No    Home Medications Prior to Admission medications   Medication Sig Start Date End Date Taking? Authorizing Provider  hydrochlorothiazide (HYDRODIURIL) 25 MG tablet Take 1 tablet (25 mg total) by mouth daily. 04/07/21  Yes Fredia Sorrow, MD  HYDROcodone-acetaminophen (NORCO/VICODIN) 5-325 MG tablet Take 1 tablet by mouth every 6 (six) hours as needed for moderate pain. 04/07/21  Yes Fredia Sorrow, MD  Apple Cider Vinegar 500 MG TABS Take 1 each by mouth.    [provider]  azelastine (ASTELIN) 0.1 % nasal spray Place 2 sprays into  both nostrils 2 (two) times daily. 01/01/19   Kennith Gain, MD  cephALEXin (KEFLEX) 500 MG capsule Take 1 capsule (500 mg total) by mouth 2 (two) times daily. 03/14/20   Malvin Johns, MD  diphenhydrAMINE (BENADRYL) 25 MG tablet Take 25 mg by mouth every 6 (six) hours as needed.    [provider]  hydrochlorothiazide (MICROZIDE) 12.5 MG capsule Take 1 capsule (12.5 mg total) by mouth daily. 06/15/20   Charlott Rakes, MD  hydrocortisone (ANUSOL-HC) 25 MG suppository Place 1 suppository (25 mg total) rectally 2 (two) times daily. 03/20/19   Couture, Cortni S, PA-C  meclizine (ANTIVERT) 25 MG tablet Take 1 tablet (25 mg total) by mouth 3 (three) times daily as needed for dizziness. 04/13/19   Horton, Barbette Hair, MD  naproxen (NAPROSYN) 375 MG tablet Take 1 tablet twice daily as needed for chest wall pain or fever. 06/19/18   Molpus, John, MD  omeprazole (PRILOSEC) 20 MG capsule Take 1 capsule (20 mg total) by mouth daily. 03/22/20   Ladell Pier, MD  traMADol  (ULTRAM) 50 MG tablet Take 1 tablet (50 mg total) by mouth every 6 (six) hours as needed. 03/14/20   Malvin Johns, MD    Allergies    Patient has no known allergies.  Review of Systems   Review of Systems  Constitutional:  Negative for chills and fever.  HENT:  Negative for ear pain and sore throat.   Eyes:  Negative for photophobia, pain and visual disturbance.  Respiratory:  Positive for shortness of breath. Negative for cough.   Cardiovascular:  Positive for leg swelling. Negative for chest pain and palpitations.  Gastrointestinal:  Negative for abdominal pain and vomiting.  Genitourinary:  Negative for dysuria and hematuria.  Musculoskeletal:  Negative for arthralgias and back pain.  Skin:  Negative for color change and rash.  Neurological:  Positive for numbness. Negative for seizures, syncope, speech difficulty, weakness and headaches.  Psychiatric/Behavioral:  Negative for confusion.   All other systems reviewed and are negative.  Physical Exam Updated Vital Signs BP (!) 172/75   Pulse 62   Temp 98.5 F (36.9 C) (Oral)   Resp 18   Ht 1.499 m (4\' 11" )   Wt 106.6 kg   SpO2 100%   BMI 47.46 kg/m   Physical Exam Vitals and nursing note reviewed.  Constitutional:      General: She is not in acute distress.    Appearance: Normal appearance. She is well-developed.  HENT:     Head: Normocephalic and atraumatic.     Mouth/Throat:     Mouth: Mucous membranes are moist.  Eyes:     Extraocular Movements: Extraocular movements intact.     Conjunctiva/sclera: Conjunctivae normal.     Pupils: Pupils are equal, round, and reactive to light.  Cardiovascular:     Rate and Rhythm: Normal rate and regular rhythm.     Heart sounds: No murmur heard. Pulmonary:     Effort: Pulmonary effort is normal. No respiratory distress.     Breath sounds: Normal breath sounds.  Abdominal:     Palpations: Abdomen is soft.     Tenderness: There is no abdominal tenderness.   Musculoskeletal:        General: Swelling present. Normal range of motion.     Cervical back: Normal range of motion and neck supple. No rigidity.     Comments: Patient with swelling to the left upper extremity.  Radial pulses 2+.  Cap refill is less  than 2 seconds.  Good movement at the fingers wrist and elbow.  Left lower extremity without any significant swelling.  Good movement of the feet toes good cap refill.  Dorsalis pedis pulses 2+.  Good movement at the knee ankle and hip.  Skin:    General: Skin is warm and dry.     Capillary Refill: Capillary refill takes less than 2 seconds.  Neurological:     General: No focal deficit present.     Mental Status: She is alert and oriented to person, place, and time.     Cranial Nerves: No cranial nerve deficit.     Sensory: No sensory deficit.     Motor: No weakness.    ED Results / Procedures / Treatments   Labs (all labs ordered are listed, but only abnormal results are displayed) Labs Reviewed  COMPREHENSIVE METABOLIC PANEL  CBC WITH DIFFERENTIAL/PLATELET    EKG EKG Interpretation  Date/Time:  Friday April 07 2021 15:37:15 EDT Ventricular Rate:  67 PR Interval:  134 QRS Duration: 76 QT Interval:  416 QTC Calculation: 439 R Axis:   52 Text Interpretation: Normal sinus rhythm Normal ECG Confirmed by Vanetta Mulders [phone]) on 04/07/2021 6:21:33 PM  Radiology DG Chest 1 View  Result Date: 04/07/2021 CLINICAL DATA:  High blood pressure. EXAM: CHEST  1 VIEW COMPARISON:  Chest x-ray 06/19/2018. FINDINGS: The heart size and mediastinal contours are within normal limits. Both lungs are clear. The visualized skeletal structures are unremarkable. IMPRESSION: No active disease. Electronically Signed   By: Darliss Cheney M.D.   On: 04/07/2021 20:01   CT Head Wo Contrast  Result Date: 04/07/2021 CLINICAL DATA:  Neuro deficit, acute, stroke suspected. EXAM: CT HEAD WITHOUT CONTRAST TECHNIQUE: Contiguous axial images were obtained from  the base of the skull through the vertex without intravenous contrast. COMPARISON:  08/27/2008 FINDINGS: Brain: There is no evidence of an acute infarct, intracranial hemorrhage, mass, midline shift, or extra-axial fluid collection. The ventricles and sulci are within normal limits for age. Vascular: Calcified atherosclerosis at the skull base. No hyperdense vessel. Skull: No fracture or suspicious osseous lesion. Sinuses/Orbits: Visualized paranasal sinuses and mastoid air cells are clear. Unremarkable orbits. Other: None. IMPRESSION: Unremarkable CT appearance of the brain. Electronically Signed   By: Sebastian Ache M.D.   On: 04/07/2021 19:13   US Venous Img Lower  Left (DVT Study)  Result Date: 04/07/2021 CLINICAL DATA:  High blood pressure, pain and swelling x3 days. EXAM: Left LOWER EXTREMITY VENOUS DOPPLER ULTRASOUND TECHNIQUE: Gray-scale sonography with compression, as well as color and duplex ultrasound, were performed to evaluate the deep venous system(s) from the level of the common femoral vein through the popliteal and proximal calf veins. COMPARISON:  None. FINDINGS: VENOUS Normal compressibility of the common femoral, superficial femoral, and popliteal veins, as well as the visualized calf veins. Visualized portions of profunda femoral vein and great saphenous vein unremarkable. No filling defects to suggest DVT on grayscale or color Doppler imaging. Doppler waveforms show normal direction of venous flow, normal respiratory plasticity and response to augmentation. Limited views of the contralateral common femoral vein are unremarkable. OTHER None. Limitations: none IMPRESSION: Negative. Electronically Signed   By: Maudry Mayhew M.D.   On: 04/07/2021 20:08   US Venous Img Upper Left (DVT Study)  Result Date: 04/07/2021 CLINICAL DATA:  High blood pressure, pain and left arm swelling. EXAM: Left UPPER EXTREMITY VENOUS DOPPLER ULTRASOUND TECHNIQUE: Gray-scale sonography with graded compression, as  well as color Doppler and  duplex ultrasound were performed to evaluate the upper extremity deep venous system from the level of the subclavian vein and including the jugular, axillary, basilic, radial, ulnar and upper cephalic vein. Spectral Doppler was utilized to evaluate flow at rest and with distal augmentation maneuvers. COMPARISON:  None. FINDINGS: Contralateral Subclavian Vein: Respiratory phasicity is normal and symmetric with the symptomatic side. No evidence of thrombus. Normal compressibility. Internal Jugular Vein: No evidence of thrombus. Normal compressibility, respiratory phasicity and response to augmentation. Subclavian Vein: No evidence of thrombus. Normal compressibility, respiratory phasicity and response to augmentation. Axillary Vein: No evidence of thrombus. Normal compressibility, respiratory phasicity and response to augmentation. Cephalic Vein: No evidence of thrombus. Normal compressibility, respiratory phasicity and response to augmentation. Basilic Vein: No evidence of thrombus. Normal compressibility, respiratory phasicity and response to augmentation. Brachial Veins: No evidence of thrombus. Normal compressibility, respiratory phasicity and response to augmentation. Radial Veins: No evidence of thrombus. Normal compressibility, respiratory phasicity and response to augmentation. Ulnar Veins: No evidence of thrombus. Normal compressibility, respiratory phasicity and response to augmentation. Venous Reflux:  None visualized. Other Findings:  None visualized. IMPRESSION: No evidence of DVT within the left upper extremity. Electronically Signed   By: Dahlia Bailiff M.D.   On: 04/07/2021 20:08    Procedures Procedures   Medications Ordered in ED Medications  0.9 %  sodium chloride infusion (0 mLs Intravenous Stopped 04/07/21 2310)  ondansetron (ZOFRAN) injection 4 mg (4 mg Intravenous Given 04/07/21 1952)  HYDROmorphone (DILAUDID) injection 1 mg (1 mg Intravenous Given 04/07/21 1943)     ED Course  I have reviewed the triage vital signs and the nursing notes.  Pertinent labs & imaging results that were available during my care of the patient were reviewed by me and considered in my medical decision making (see chart for details).    MDM Rules/Calculators/A&P                           Patient nontoxic no acute distress.  Oxygen saturations on room air 100%.  Respiratory rate at the highest was 20.  No tachycardia.  No fever.  Patient did have some swelling to the left upper extremity at the hand and forearm and arm.  No obvious deformity.  Radial pulse cap refills are 2+.  Dorsalis pedis pulses 2+ for left lower extremity.  No significant swelling to the left lower extremity.  Complete metabolic panel normal.  CBC no leukocytosis hemoglobin is normal.  Patient's blood pressures are elevated.  Because of the pain sounds more like a connective tissue disorder there was no fall or injury there is no deformities.  Ultrasound of left lower extremity and left upper extremity without evidence of a deep vein thrombosis.  CT head was done because of the high blood pressures.  It was negative.  Patient's renal functions normal.  And liver function test are normal.  Patient improved here by pain medication.  Renew patient's blood pressure medicine give her referral to the wellness clinic she will need a blood pressure followed carefully.  Is possible that patient's blood pressure will not be completely controlled on her current medication.  We will also treat with a short course of pain medication.  Wellness clinic in follow-up for connective tissue disorders.  Patient will return for any new or worse symptoms.   Likely due to the pain.  Does not necessarily fit into a stroke type syndrome.  More suggestive of a connective tissue disorder.  Patient's blood pressure did not improve significantly with pain control.  Patient will be started on hydrochlorothiazide.  Patient with a complaint  of shortness of breath.  But no hypoxia no tachycardia.  Lungs were clear.  Patient did not really complain of shortness of breath to me but did out in triage.  Respiratory rate.  Patient's main complaint with me was the left upper extremity and left lower extremity pain and the high blood pressure.  Final Clinical Impression(s) / ED Diagnoses Final diagnoses:  Primary hypertension  Pain and numbness of left upper extremity    Rx / DC Orders ED Discharge Orders          Ordered    HYDROcodone-acetaminophen (NORCO/VICODIN) 5-325 MG tablet  Every 6 hours PRN        04/08/21 0000    hydrochlorothiazide (HYDRODIURIL) 25 MG tablet  Daily        04/08/21 0000             Fredia Sorrow, MD 04/08/21 0000    Fredia Sorrow, MD 04/08/21 0001

## 2021-04-07 NOTE — Discharge Instructions (Addendum)
Take the pain medicine as directed.  Make an appointment to follow-up the wellness clinic.  Restart your blood pressure medicine.  Today's work-up without any acute findings.  Return for any new or worse symptoms.  Follow-up with the wellness clinic for recheck of your blood pressure as well as for further evaluation of the pain.Marland Kitchen

## 2021-04-07 NOTE — ED Triage Notes (Addendum)
Pt c/o swelling/pain to left UE, LE and left breast area, SOB x 3 days-denies fever/flu sx-pt states she was dx with "a rare breast disease-mondor" 3 days ago after mammogram results-NAD-steady gait

## 2021-04-07 NOTE — ED Notes (Signed)
Pt transported to CT ?

## 2021-04-08 MED ORDER — HYDROCHLOROTHIAZIDE 25 MG PO TABS: 25.0000 mg | ORAL_TABLET | Freq: Every day | ORAL | 1 refills | Status: DC

## 2021-04-08 MED ORDER — HYDROCODONE-ACETAMINOPHEN 5-325 MG PO TABS: 1.0000 | ORAL_TABLET | Freq: Four times a day (QID) | ORAL | 0 refills | Status: DC | PRN

## 2021-04-08 NOTE — ED Notes (Signed)
Patient verbalizes understanding of discharge instructions. Opportunity for questioning and answers were provided. Armband removed by staff, pt discharged from ED. Ambulated out to lobby  

## 2021-04-18 ENCOUNTER — Encounter: Payer: Self-pay | Admitting: Critical Care Medicine

## 2021-04-18 ENCOUNTER — Other Ambulatory Visit: Payer: Self-pay

## 2021-04-18 ENCOUNTER — Ambulatory Visit: Payer: Self-pay | Attending: Critical Care Medicine | Admitting: Critical Care Medicine

## 2021-04-18 VITALS — BP 137/82 | HR 70 | Resp 16 | Wt 236.0 lb

## 2021-04-18 DIAGNOSIS — Z720 Tobacco use: Secondary | ICD-10-CM

## 2021-04-18 DIAGNOSIS — M79605 Pain in left leg: Secondary | ICD-10-CM | POA: Insufficient documentation

## 2021-04-18 DIAGNOSIS — E042 Nontoxic multinodular goiter: Secondary | ICD-10-CM

## 2021-04-18 DIAGNOSIS — I10 Essential (primary) hypertension: Secondary | ICD-10-CM

## 2021-04-18 DIAGNOSIS — Z114 Encounter for screening for human immunodeficiency virus [HIV]: Secondary | ICD-10-CM

## 2021-04-18 DIAGNOSIS — Z23 Encounter for immunization: Secondary | ICD-10-CM

## 2021-04-18 DIAGNOSIS — Z1159 Encounter for screening for other viral diseases: Secondary | ICD-10-CM

## 2021-04-18 DIAGNOSIS — Z6841 Body Mass Index (BMI) 40.0 and over, adult: Secondary | ICD-10-CM

## 2021-04-18 DIAGNOSIS — Z8781 Personal history of (healed) traumatic fracture: Secondary | ICD-10-CM | POA: Insufficient documentation

## 2021-04-18 DIAGNOSIS — Z1211 Encounter for screening for malignant neoplasm of colon: Secondary | ICD-10-CM

## 2021-04-18 MED ORDER — NICOTINE POLACRILEX 2 MG MT LOZG
2.0000 mg | LOZENGE | OROMUCOSAL | 0 refills | Status: DC | PRN
  Filled 2021-04-18: qty 100, fill #0

## 2021-04-18 MED ORDER — DICLOFENAC SODIUM 1 % EX GEL
2.0000 g | Freq: Four times a day (QID) | CUTANEOUS | 1 refills | Status: DC
  Filled 2021-04-18: qty 100, 13d supply, fill #0

## 2021-04-18 MED ORDER — HYDROCHLOROTHIAZIDE 25 MG PO TABS
25.0000 mg | ORAL_TABLET | Freq: Every day | ORAL | 1 refills | Status: DC
  Filled 2021-04-18 – 2021-07-07 (×2): qty 30, 30d supply, fill #0

## 2021-04-18 NOTE — Progress Notes (Signed)
New Patient Office Visit  Subjective:  Patient ID: Sally Reid, female    DOB: 04/22/1961  Age: 60 y.o. MRN: [id number]  CC:  Chief Complaint  Patient presents with   Hospitalization Follow-up   Leg Swelling    HPI Sally Reid presents for primary care visit to establish.  Note this patient was just in the emergency room November 4 with left lower extremity pain and left forearm pain.  The left forearm pain has now resolved itself.  Patient did have elevated blood pressure 175/75.  Patient was started on hydrochlorothiazide in today blood pressure is good at 137/82.  Patient still has left leg and ankle pain and swelling.  She had a fracture of the left leg and ankle with plates and screws placed in 2005 and was admitted to [facility] hospital for this.  She cannot member the name of the doctor that performed the surgery.  As this was prior to the evolution of the EMR as we will need to get records on paper sent to [place] with record release from the patient.  The patient is due a flu shot and pneumonia shot she agreed to receive both of these today she is also due colon cancer screening HIV hepatitis C screening.  The patient has had a tetanus shot it was 5 years ago.  Patient is also due a Pap smear her last one was in 2013 and was abnormal.  She has had a hysterectomy but still has her cervix.  The patient has no history of diabetes and screening labs during the visit in the emergency room last week showed normal liver kidney function normal blood counts and no diabetes.  She did have venous Dopplers done of left lower extremity and they were negative for DVT.  Patient is on no real medications except the hydrochlorothiazide that was sent from the emergency medicine doctor on November 4.  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  The pain began about 6 months ago and is  progressively gotten worse.  Past Medical History:  Diagnosis Date   Arthritis    Hypertension     Past Surgical History:  Procedure Laterality Date   ABDOMINAL HYSTERECTOMY     2005   ANKLE FRACTURE SURGERY Left    arthroscopy of left knee  2008   for cartilage surgery   CESAREAN SECTION     2 previous   LEG SURGERY      Family History  Problem Relation Age of Onset   Diabetes Mother    Hypertension Mother    Lymphoma Brother     Social History   Socioeconomic History   Marital status: Married    Spouse name: Not on file   Number of children: Not on file   Years of education: Not on file   Highest education level: Not on file  Occupational History   Occupation: unemployed    Comment: without health insurance  Tobacco Use   Smoking status: Some Days    Packs/day: 0.25    Types: Cigarettes   Smokeless tobacco: Never  Vaping Use   Vaping Use: Never used  Substance and Sexual Activity   Alcohol use: Yes    Comment: occassionally   Drug use: No   Sexual activity: Yes    Birth control/protection: Surgical  Other Topics Concern   Not on file  Social History Narrative   Lives in [place].   Had 2 children( two boys 45 and 29).   Living with fiancee.   Social Determinants of Health   Financial Resource Strain: Not on file  Food Insecurity: Not on file  Transportation Needs: Not on file  Physical Activity: Not on file  Stress: Not on file  Social Connections: Not on file  Intimate Partner Violence: Not on file    ROS Review of Systems  Constitutional:  Positive for unexpected weight change. Negative for chills, diaphoresis and fever.  HENT:  Negative for congestion, hearing loss, nosebleeds, sore throat and tinnitus.   Eyes:  Negative for photophobia and redness.  Respiratory:  Negative for cough, choking, shortness of breath, wheezing and stridor.   Cardiovascular:  Positive for leg swelling. Negative for chest pain and palpitations.   Gastrointestinal:  Negative for abdominal pain, blood in stool, constipation, diarrhea, nausea and vomiting.  Endocrine: Negative for polydipsia.  Genitourinary:  Negative for dysuria, flank pain, frequency, hematuria and urgency.  Musculoskeletal:  Negative for back pain, myalgias and neck pain.       Left ankle swelling and pain left lower leg pain  Skin:  Negative for rash.  Allergic/Immunologic: Negative for environmental allergies.  Neurological:  Negative for dizziness, tremors, seizures, weakness and headaches.  Hematological:  Does not bruise/bleed easily.  Psychiatric/Behavioral:  Negative for self-injury, sleep disturbance and suicidal ideas. The patient is not nervous/anxious and is not hyperactive.    Objective:   Today's Vitals: BP 137/82   Pulse 70   Resp 16   Wt 236 lb (107 kg)   SpO2 99%   BMI 47.67 kg/m   Physical Exam Vitals reviewed.  Constitutional:      Appearance: Normal appearance. She is well-developed. She is obese. She is not diaphoretic.  HENT:     Head: Normocephalic and atraumatic.     Nose: No nasal deformity, septal deviation, mucosal edema or rhinorrhea.     Right Sinus: No maxillary sinus tenderness or frontal sinus tenderness.     Left Sinus: No maxillary sinus tenderness or frontal sinus tenderness.     Mouth/Throat:     Pharynx: No oropharyngeal exudate.     Comments: Mild periodontal disease Eyes:     General: No scleral icterus.    Conjunctiva/sclera: Conjunctivae normal.     Pupils: Pupils are equal, round, and reactive to light.  Neck:     Thyroid: No thyromegaly.     Vascular: No carotid bruit or JVD.     Trachea: Trachea normal. No tracheal tenderness or tracheal deviation.  Cardiovascular:     Rate and Rhythm: Normal rate and regular rhythm.     Chest Wall: PMI is not displaced.     Pulses: Normal pulses. No decreased pulses.     Heart sounds: Normal heart sounds, S1 normal and S2 normal. Heart sounds not distant. No murmur  heard. No systolic murmur is present.  No diastolic murmur is present.    No friction rub. No gallop. No S3 or S4 sounds.  Pulmonary:     Effort: Pulmonary effort is normal. No tachypnea, accessory muscle usage or respiratory distress.     Breath sounds: Normal breath sounds. No stridor. No decreased breath sounds, wheezing, rhonchi or rales.  Chest:     Chest wall: No tenderness.  Abdominal:     General: Bowel sounds are normal. There is no distension.     Palpations: Abdomen is soft. Abdomen is not rigid.  Tenderness: There is no abdominal tenderness. There is no guarding or rebound.  Musculoskeletal:        General: Swelling, tenderness and deformity present. No signs of injury. Normal range of motion.     Cervical back: Normal range of motion and neck supple. No edema, erythema or rigidity. No muscular tenderness. Normal range of motion.     Right lower leg: No edema.     Left lower leg: Edema present.     Comments: Edema swelling and deformity left lower leg with deformity of the distal tibia and ankle bones  Lymphadenopathy:     Head:     Right side of head: No submental or submandibular adenopathy.     Left side of head: No submental or submandibular adenopathy.     Cervical: No cervical adenopathy.  Skin:    General: Skin is warm and dry.     Coloration: Skin is not pale.     Findings: No rash.     Nails: There is no clubbing.  Neurological:     General: No focal deficit present.     Mental Status: She is alert and oriented to person, place, and time.     Sensory: No sensory deficit.  Psychiatric:        Mood and Affect: Mood normal.        Speech: Speech normal.        Behavior: Behavior normal.        Thought Content: Thought content normal.        Judgment: Judgment normal.    Assessment & Plan:   Problem List Items Addressed This Visit       Cardiovascular and Mediastinum   Essential hypertension - Primary    Hypertension which has been previously  documented patient now on higher dose of hydrochlorothiazide 25 mg daily with blood pressure in good control      Relevant Medications   hydrochlorothiazide (HYDRODIURIL) 25 MG tablet     Endocrine   Multiple thyroid nodules    Thyroid exam normal at this time will observe        Other   Morbid obesity with BMI of 45.0-49.9, adult (HCC)    Morbid obesity with elevated BMI of 47.7  This is contributing to her pain in the lower extremities  Patient was given dietary recommendations      Left leg pain    Left lower leg pain with deformity and prior ankle fracture  Plan to obtain old records  X-ray of the leg and ankle be performed  Topical Voltaren gel administered for pain      Relevant Orders   DG Ankle Complete Left   DG Tibia/Fibula Left   History of ankle fracture    Need to get all records from the previous left ankle fracture      Relevant Orders   DG Ankle Complete Left   DG Tibia/Fibula Left   Tobacco use       Current smoking consumption amount: 3 to 5 cigarettes a day  Dicsussion on advise to quit smoking and smoking impacts: Cardiovascular impacts  Patient's willingness to quit: Willing to quit  Methods to quit smoking discussed: Nicotine replacement  Medication management of smoking session drugs discussed: Nicotine replacement lozenge  Resources provided:  AVS   Setting quit date not established  Follow-up arranged 3 months   Time spent counseling the patient: 5 minutes       Other Visit Diagnoses     Need  for hepatitis C screening test       Relevant Orders   HCV Ab w Reflex to Quant PCR   Encounter for screening for HIV       Relevant Orders   HIV antibody (with reflex)   Colon cancer screening       Relevant Orders   Fecal occult blood, imunochemical   Need for immunization against influenza       Relevant Orders   Flu Vaccine QUAD 15mo+IM (Fluarix, Fluzone & Alfiuria Quad PF) (Completed)       Outpatient Encounter  Medications as of 04/18/2021  Medication Sig   Apple Cider Vinegar 500 MG TABS Take 1 each by mouth.   diclofenac Sodium (VOLTAREN) 1 % GEL Apply 2 g topically 4 (four) times daily. To left ankle   nicotine polacrilex (COMMIT) 2 MG lozenge Take 1 lozenge (2 mg total) by mouth as needed for smoking cessation.   [DISCONTINUED] hydrochlorothiazide (HYDRODIURIL) 25 MG tablet Take 1 tablet (25 mg total) by mouth daily.   [DISCONTINUED] HYDROcodone-acetaminophen (NORCO/VICODIN) 5-325 MG tablet Take 1 tablet by mouth every 6 (six) hours as needed for moderate pain.   [DISCONTINUED] meclizine (ANTIVERT) 25 MG tablet Take 1 tablet (25 mg total) by mouth 3 (three) times daily as needed for dizziness.   [DISCONTINUED] naproxen (NAPROSYN) 375 MG tablet Take 1 tablet twice daily as needed for chest wall pain or fever.   [DISCONTINUED] omeprazole (PRILOSEC) 20 MG capsule Take 1 capsule (20 mg total) by mouth daily.   [DISCONTINUED] traMADol (ULTRAM) 50 MG tablet Take 1 tablet (50 mg total) by mouth every 6 (six) hours as needed.   hydrochlorothiazide (HYDRODIURIL) 25 MG tablet Take 1 tablet (25 mg total) by mouth daily.   [DISCONTINUED] azelastine (ASTELIN) 0.1 % nasal spray Place 2 sprays into both nostrils 2 (two) times daily. (Patient not taking: Reported on 04/18/2021)   [DISCONTINUED] cephALEXin (KEFLEX) 500 MG capsule Take 1 capsule (500 mg total) by mouth 2 (two) times daily. (Patient not taking: Reported on 04/18/2021)   [DISCONTINUED] diphenhydrAMINE (BENADRYL) 25 MG tablet Take 25 mg by mouth every 6 (six) hours as needed. (Patient not taking: Reported on 04/18/2021)   [DISCONTINUED] hydrochlorothiazide (MICROZIDE) 12.5 MG capsule Take 1 capsule (12.5 mg total) by mouth daily. (Patient not taking: Reported on 04/18/2021)   [DISCONTINUED] hydrocortisone (ANUSOL-HC) 25 MG suppository Place 1 suppository (25 mg total) rectally 2 (two) times daily. (Patient not taking: Reported on 04/18/2021)   No  facility-administered encounter medications on file as of 04/18/2021.  Patient given Prevnar 20 pneumonia vaccine and flu vaccine this visit  Follow-up: Return in about 3 months (around 07/19/2021).   Asencion Noble, MD

## 2021-04-18 NOTE — Assessment & Plan Note (Signed)
Morbid obesity with elevated BMI of 47.7  This is contributing to her pain in the lower extremities  Patient was given dietary recommendations

## 2021-04-18 NOTE — Assessment & Plan Note (Signed)
  .   Current smoking consumption amount: 3 to 5 cigarettes a day  . Dicsussion on advise to quit smoking and smoking impacts: Cardiovascular impacts  . Patient's willingness to quit: Willing to quit  . Methods to quit smoking discussed: Nicotine replacement  . Medication management of smoking session drugs discussed: Nicotine replacement lozenge  . Resources provided:  AVS   . Setting quit date not established  . Follow-up arranged 3 months   Time spent counseling the patient: 5 minutes  

## 2021-04-18 NOTE — Assessment & Plan Note (Signed)
Hypertension which has been previously documented patient now on higher dose of hydrochlorothiazide 25 mg daily with blood pressure in good control

## 2021-04-18 NOTE — Assessment & Plan Note (Signed)
Left lower leg pain with deformity and prior ankle fracture  Plan to obtain old records  X-ray of the leg and ankle be performed  Topical Voltaren gel administered for pain

## 2021-04-18 NOTE — Assessment & Plan Note (Signed)
Need to get all records from the previous left ankle fracture

## 2021-04-18 NOTE — Patient Instructions (Addendum)
Call the cancer control program to apply for free pap smear, they will order the studies once approved The number is:   [phone]  X-ray of the left lower leg and ankle be made at the [facility] you can do this anytime you do not not need an appointment  Begin Voltaren gel to the left lower leg and ankle 4 times daily prescription sent to our pharmacy  Continue hydrochlorothiazide blood pressure medicine no changes refill sent to our pharmacy  A Pap smear needs to be performed call the number on the handout as noted above  You received a flu vaccine and pneumonia vaccine today  Colon cancer screening will be sent to you today through the lab please process and bring her back to the lab  Stop by the lab and obtain blood draw for hepatitis C screen and HIV screen  Return to see Dr. Delford Field 3 months  He had you sign a medical release for [facility] so that we can obtain records for review 2005 ankle surgery  Focus on a healthy diet see attachment to lose weight

## 2021-04-18 NOTE — Assessment & Plan Note (Signed)
Thyroid exam normal at this time will observe

## 2021-04-19 ENCOUNTER — Ambulatory Visit
Admission: RE | Admit: 2021-04-19 | Discharge: 2021-04-19 | Disposition: A | Discharge status: Home / Self Care | Payer: Self-pay | Source: Ambulatory Visit | Attending: Critical Care Medicine | Admitting: Critical Care Medicine

## 2021-04-19 DIAGNOSIS — Z8781 Personal history of (healed) traumatic fracture: Secondary | ICD-10-CM

## 2021-04-19 DIAGNOSIS — M79605 Pain in left leg: Secondary | ICD-10-CM

## 2021-04-21 ENCOUNTER — Telehealth: Payer: Self-pay

## 2021-04-21 ENCOUNTER — Telehealth: Payer: Self-pay | Admitting: Critical Care Medicine

## 2021-04-21 DIAGNOSIS — Z8781 Personal history of (healed) traumatic fracture: Secondary | ICD-10-CM

## 2021-04-21 DIAGNOSIS — M79605 Pain in left leg: Secondary | ICD-10-CM

## 2021-04-21 DIAGNOSIS — B182 Chronic viral hepatitis C: Secondary | ICD-10-CM | POA: Insufficient documentation

## 2021-04-21 LAB — HCV AB W REFLEX TO QUANT PCR: HCV Ab: >11 s/co ratio — ABNORMAL HIGH (ref 0.0–0.9)

## 2021-04-21 LAB — HCV RT-PCR, QUANT (NON-GRAPH)
HCV log10: 6.064 log10 IU/mL
Hepatitis C Quantitation: 1160000 IU/mL

## 2021-04-21 LAB — HIV ANTIBODY (ROUTINE TESTING W REFLEX): HIV Screen 4th Generation wRfx: NONREACTIVE

## 2021-04-21 NOTE — Telephone Encounter (Signed)
Pt was called and is aware of results, DOB was confirmed.  ?

## 2021-04-21 NOTE — Telephone Encounter (Signed)
-----   Message from Storm Frisk, MD sent at 04/20/2021 12:15 PM EST ----- Let pt know xray of ankle is NORMAL  no fracture or any problem with the ankle on exam

## 2021-04-21 NOTE — Telephone Encounter (Signed)
Pt called with results  needs ortho and hep c clinic referrals pt aware of results

## 2021-04-24 ENCOUNTER — Telehealth: Payer: Self-pay

## 2021-04-24 ENCOUNTER — Other Ambulatory Visit (HOSPITAL_COMMUNITY): Payer: Self-pay

## 2021-04-24 LAB — FECAL OCCULT BLOOD, IMMUNOCHEMICAL: Fecal Occult Bld: NEGATIVE

## 2021-04-24 NOTE — Telephone Encounter (Signed)
-----   Message from Storm Frisk, MD sent at 04/24/2021  6:08 AM EST ----- Let pt know colon cancer screen is NEG rechk in one year

## 2021-04-24 NOTE — Telephone Encounter (Signed)
[facility] Patient Advocate Encounter ? ?Insurance verification completed.   ? ?The patient is uninsured and will need patient assistance for medication. ? ?We can complete the application and will need to meet with the patient for signatures and income documentation. ? ?Chesnie Capell, CPhT ?Specialty Pharmacy Patient Advocate ?[facility] ?Phone: [phone] ?Fax:  [phone]  ?

## 2021-04-24 NOTE — Telephone Encounter (Signed)
Pt was called and is aware of results, DOB was confirmed.  ?

## 2021-04-26 ENCOUNTER — Encounter: Payer: Self-pay | Admitting: Internal Medicine

## 2021-05-02 ENCOUNTER — Ambulatory Visit: Payer: Self-pay | Admitting: Orthopaedic Surgery

## 2021-05-03 ENCOUNTER — Other Ambulatory Visit: Payer: Self-pay

## 2021-05-03 ENCOUNTER — Ambulatory Visit (INDEPENDENT_AMBULATORY_CARE_PROVIDER_SITE_OTHER): Payer: Self-pay | Admitting: Internal Medicine

## 2021-05-03 ENCOUNTER — Encounter: Payer: Self-pay | Admitting: Internal Medicine

## 2021-05-03 VITALS — HR 68 | Temp 97.5°F | Resp 16 | Ht 59.0 in | Wt 234.0 lb

## 2021-05-03 DIAGNOSIS — B182 Chronic viral hepatitis C: Secondary | ICD-10-CM

## 2021-05-03 NOTE — Patient Instructions (Signed)
We'll need more blood tests and also liver imaging to determine the proper treatment for you hepatitis c  Blood tests can be done here today Our staff will also help schedule your liver imaging  Follow up with [place] in around 4 weeks so we can review results and discuss treatment   Avoid more than 3 gram of tylenol a day Avoid sharing personal hygiene equipments If you had lived with someone I would advise having those people screened for hep c as well

## 2021-05-03 NOTE — Progress Notes (Signed)
[facility]  Reason for Consult:chronic hep c Referring Provider: Shan Levans    Patient Active Problem List   Diagnosis Date Noted   Chronic hepatitis C without hepatic coma (HCC) 04/21/2021   Left leg pain 04/18/2021   History of ankle fracture 04/18/2021   Tobacco use 04/18/2021   Essential hypertension 03/23/2020   Fatty liver 03/23/2020   Morbid obesity with BMI of 45.0-49.9, adult (HCC) 03/23/2020   Multiple thyroid nodules 07/14/2018      HPI: Sally Reid is a 60 y.o. female smoker, obese, orif in left ankle, ligament repair in left knee, referred by pcp for positive hep c  Patient just had a routine physical and hep c screening was done came back positive both antibody and viral load on 11/15  This is first time she heard she has hep c  Timing of diagnosis: Unknown; she works as a cna for 24 years as of 2022. Brother has some kind of hepatitis although he was cured after treatment. She doesn't know what kind of hepatitis (c or b brother had)   Risk: no hx ivdu, indu, kidney/dialysis issue, blood transfusion, tatoos, known exposure to closed co-habitant/relatives with hep c  Cirrhosis finding: no hx n/v/hematemesis, bloody/black stool, abd distension, fluid withdrawal from abd/lungs, hx confusion, fatigue, edema, weight loss, poor apetite  Extra gi manifestation: no abnormal skin rash, fatigue, diffuse joint pain/swelling, hx kidney disease    We reviewed: Natural history of hep c and its complications available treatment options for hepatitis C Other factors potentially worsening liver disease, including alcohol use; obesity; diabetes mellitus, and viral coinfection We discussed potential medications that can contribute to liver inflammation like acetaminophen, and to avoiding excessive amount (more than 3 gram daily use) acetaminophen    She takes a pill of tylenol 325 mg a day for her  osteoarthritis   Meds: Outpatient Encounter Medications as of 05/03/2021  Medication Sig   Apple Cider Vinegar 500 MG TABS Take 1 each by mouth.   diclofenac Sodium (VOLTAREN) 1 % GEL Apply 2 g topically 4 (four) times daily. To left ankle   hydrochlorothiazide (HYDRODIURIL) 25 MG tablet Take 1 tablet (25 mg total) by mouth daily.   nicotine polacrilex (COMMIT) 2 MG lozenge Take 1 lozenge (2 mg total) by mouth as needed for smoking cessation. (Patient not taking: Reported on 05/03/2021)   No facility-administered encounter medications on file as of 05/03/2021.   No cholesterol/heart burn medication   Review of Systems: ROS All other ros negative      Past Medical History:  Diagnosis Date   Arthritis    Hypertension     Social History   Tobacco Use   Smoking status: Some Days    Packs/day: 0.25    Types: Cigarettes   Smokeless tobacco: Never  Vaping Use   Vaping Use: Never used  Substance Use Topics   Alcohol use: Yes    Comment: occassionally   Drug use: No    Family History  Problem Relation Age of Onset   Diabetes Mother    Hypertension Mother    Lymphoma Brother     No Known Allergies  OBJECTIVE: Vitals:   05/03/21 1423  Pulse: 68  Resp: 16  Temp: (!) 97.5 F (36.4 C)  TempSrc: Temporal  SpO2: 100%  Weight: 234 lb (106.1 kg)  Height: 4\' 11"  (1.499 m)   Body mass index is 47.26 kg/m.   Physical Exam General/constitutional: no  distress, pleasant HEENT: Normocephalic, PER, Conj Clear, EOMI, Oropharynx clear Neck supple CV: rrr no mrg Lungs: clear to auscultation, normal respiratory effort Abd: Soft, Nontender Ext: no edema Skin: No Rash Neuro: nonfocal MSK: no peripheral joint swelling/tenderness/warmth; back spines nontender  Lab: Lab Results  Component Value Date   WBC 7.2 04/07/2021   HGB 14.0 04/07/2021   HCT 41.6 04/07/2021   MCV 87.9 04/07/2021   PLT 225 04/07/2021   Last metabolic panel Lab Results  Component Value  Date   GLUCOSE 84 04/07/2021   NA 139 04/07/2021   K 3.9 04/07/2021   CL 105 04/07/2021   CO2 26 04/07/2021   BUN 14 04/07/2021   CREATININE 0.88 04/07/2021   GFRNONAA >60 04/07/2021   CALCIUM 9.3 04/07/2021   PROT 7.4 04/07/2021   ALBUMIN 3.5 04/07/2021   BILITOT 0.4 04/07/2021   ALKPHOS 75 04/07/2021   AST 40 04/07/2021   ALT 40 04/07/2021   ANIONGAP 8 04/07/2021   No results found for: AFP  No results found for: INR, PROTIME   Microbiology:  Serology:  Imaging: 03/2020 ct abd pelv with contrast 1. No evidence of diverticulitis or other acute bowel pathology. 2. Fatty change of the liver. 3. Aortic atherosclerosis. 4. Facet osteoarthritis at L4-5 and L5-S1 that could be symptomatic   11/2017 ruqus limited Liver:   No focal lesion identified. Within normal limits in parenchymal echogenicity. Portal vein is patent on color Doppler imaging with normal direction of blood flow towards the liver.   IMPRESSION: Negative right upper quadrant ultrasound.  Assessment/plan: Problem List Items Addressed This Visit       Digestive   Chronic hepatitis C without hepatic coma (HCC) - Primary   Relevant Orders   Hepatitis B surface antibody,quantitative   Hepatitis B Surface AntiGEN   Hepatitis B Core Antibody, total   Hepatitis C genotype   US ABDOMEN COMPLETE W/ELASTOGRAPHY      #chronic hep c  Prior treatment: no GT: unknown Evidence of cirrhosis: no based on history; will await elastography Interested in treatment Potential DDI: none    -Labs: hepatitis b and hep c genotype today -Imaging: order elastography  -follow up: in around 4 weeks -Meds planned: potentially mavyret vs epclusa  -discussed natural progression of hep c, transmission (avoid sharing personal hygiene equipment) -discussed avoid toxin like etoh and excessive acetamaminphen (no more than 2 gram a day) -discussed healthy life style and good glucose control -discussed avoiding eating  raw sea food -discussed we can treat hep c but can be reinfected -discussed hepatitis coinfection and vaccination       Follow-up: Return in about 4 weeks (around 05/31/2021).  Raymondo Band, MD [facility] [phone] pager   [phone] cell 05/03/2021, 2:28 PM

## 2021-05-05 LAB — HEPATITIS C GENOTYPE

## 2021-05-05 LAB — HEPATITIS B SURFACE ANTIGEN: Hepatitis B Surface Ag: NONREACTIVE

## 2021-05-05 LAB — HEPATITIS B SURFACE ANTIBODY, QUANTITATIVE: Hep B S AB Quant (Post): 889 m[IU]/mL (ref >=10)

## 2021-05-05 LAB — HEPATITIS B CORE ANTIBODY, TOTAL: Hep B Core Total Ab: NONREACTIVE

## 2021-05-12 ENCOUNTER — Other Ambulatory Visit: Payer: Self-pay

## 2021-05-12 ENCOUNTER — Ambulatory Visit (HOSPITAL_COMMUNITY)
Admission: RE | Admit: 2021-05-12 | Discharge: 2021-05-12 | Disposition: A | Discharge status: Home / Self Care | Payer: Self-pay | Source: Ambulatory Visit | Attending: Internal Medicine | Admitting: Internal Medicine

## 2021-05-12 DIAGNOSIS — B182 Chronic viral hepatitis C: Secondary | ICD-10-CM | POA: Insufficient documentation

## 2021-06-14 ENCOUNTER — Ambulatory Visit: Payer: Self-pay | Admitting: Internal Medicine

## 2021-06-15 ENCOUNTER — Encounter: Payer: Self-pay | Admitting: Internal Medicine

## 2021-06-15 ENCOUNTER — Ambulatory Visit (INDEPENDENT_AMBULATORY_CARE_PROVIDER_SITE_OTHER): Payer: Self-pay | Admitting: Internal Medicine

## 2021-06-15 ENCOUNTER — Other Ambulatory Visit: Payer: Self-pay

## 2021-06-15 VITALS — BP 125/79 | HR 60 | Temp 98.3°F | Wt 238.0 lb

## 2021-06-15 DIAGNOSIS — B182 Chronic viral hepatitis C: Secondary | ICD-10-CM

## 2021-06-15 NOTE — Patient Instructions (Signed)
Recheck hep c rna in end of 10/2021  Agree about sticking to healthy lifestyle/diet

## 2021-06-15 NOTE — Progress Notes (Signed)
[facility]  Reason for Consult:chronic hep c Referring Provider: Asencion Noble    Patient Active Problem List   Diagnosis Date Noted   Chronic hepatitis C without hepatic coma ([place]) 04/21/2021   Left leg pain 04/18/2021   History of ankle fracture 04/18/2021   Tobacco use 04/18/2021   Essential hypertension 03/23/2020   Fatty liver 03/23/2020   Morbid obesity with BMI of 45.0-49.9, adult ([place]) 03/23/2020   Multiple thyroid nodules 07/14/2018      HPI: Sally Reid is a 61 y.o. female smoker, obese, orif in left ankle, ligament repair in left knee, referred by pcp for positive hep c  Patient just had a routine physical and hep c screening was done came back positive both antibody and viral load on 11/15  This is first time she heard she has hep c  Timing of diagnosis: Unknown; she works as a cna for 24 years as of 2022. Brother has some kind of hepatitis although he was cured after treatment. She doesn't know what kind of hepatitis (c or b brother had)   Risk: no hx ivdu, indu, kidney/dialysis issue, blood transfusion, tatoos, known exposure to closed co-habitant/relatives with hep c  Cirrhosis finding: no hx n/v/hematemesis, bloody/black stool, abd distension, fluid withdrawal from abd/lungs, hx confusion, fatigue, edema, weight loss, poor apetite  Extra gi manifestation: no abnormal skin rash, fatigue, diffuse joint pain/swelling, hx kidney disease    We reviewed: Natural history of hep c and its complications available treatment options for hepatitis C Other factors potentially worsening liver disease, including alcohol use; obesity; diabetes mellitus, and viral coinfection We discussed potential medications that can contribute to liver inflammation like acetaminophen, and to avoiding excessive amount (more than 3 gram daily use) acetaminophen    She takes a pill of tylenol 325 mg a day for her  osteoarthritis   ----------------------- 06/15/21 id clinic f/u Reviewed labs/elastography She is vaccinated/responder to hep b No change in health Feels well No evidence cirrhosis based on labs/imaging/history previously    Meds: Outpatient Encounter Medications as of 06/15/2021  Medication Sig   Apple Cider Vinegar 500 MG TABS Take 1 each by mouth.   diclofenac Sodium (VOLTAREN) 1 % GEL Apply 2 g topically 4 (four) times daily. To left ankle   hydrochlorothiazide (HYDRODIURIL) 25 MG tablet Take 1 tablet (25 mg total) by mouth daily.   [DISCONTINUED] nicotine polacrilex (COMMIT) 2 MG lozenge Take 1 lozenge (2 mg total) by mouth as needed for smoking cessation. (Patient not taking: Reported on 05/03/2021)   No facility-administered encounter medications on file as of 06/15/2021.   No cholesterol/heart burn medication   Review of Systems: ROS All other ros negative      Past Medical History:  Diagnosis Date   Arthritis    Hypertension     Social History   Tobacco Use   Smoking status: Some Days    Packs/day: 0.25    Types: Cigarettes   Smokeless tobacco: Never  Vaping Use   Vaping Use: Never used  Substance Use Topics   Alcohol use: Yes    Comment: occassionally   Drug use: No    Family History  Problem Relation Age of Onset   Diabetes Mother    Hypertension Mother    Lymphoma Brother     No Known Allergies  OBJECTIVE: Vitals:   06/15/21 1010  BP: 125/79  Pulse: 60  Temp: 98.3 F (36.8 C)  TempSrc: Oral  SpO2: 100%  Weight: 238 lb (108 kg)   Body mass index is 48.07 kg/m.   Physical Exam General/constitutional: no distress, pleasant HEENT: Normocephalic, PER, Conj Clear, EOMI, Oropharynx clear Neck supple CV: rrr no mrg Lungs: clear to auscultation, normal respiratory effort Abd: Soft, Nontender Ext: no edema Skin: No Rash  Lab: Lab Results  Component Value Date   WBC 7.2 04/07/2021   HGB 14.0 04/07/2021   HCT 41.6 04/07/2021    MCV 87.9 04/07/2021   PLT 225 88/41/6606   Last metabolic panel Lab Results  Component Value Date   GLUCOSE 84 04/07/2021   NA 139 04/07/2021   K 3.9 04/07/2021   CL 105 04/07/2021   CO2 26 04/07/2021   BUN 14 04/07/2021   CREATININE 0.88 04/07/2021   GFRNONAA >60 04/07/2021   CALCIUM 9.3 04/07/2021   PROT 7.4 04/07/2021   ALBUMIN 3.5 04/07/2021   BILITOT 0.4 04/07/2021   ALKPHOS 75 04/07/2021   AST 40 04/07/2021   ALT 40 04/07/2021   ANIONGAP 8 04/07/2021   No results found for: AFP  No results found for: INR, PROTIME   Microbiology:  Serology:  Imaging: 05/2021 liver elastogrophy Median kPa:  3.4   Diagnostic category:  < or = 5 kPa: high probability of being normal      03/2020 ct abd pelv with contrast 1. No evidence of diverticulitis or other acute bowel pathology. 2. Fatty change of the liver. 3. Aortic atherosclerosis. 4. Facet osteoarthritis at L4-5 and L5-S1 that could be symptomatic   11/2017 ruqus limited Liver:   No focal lesion identified. Within normal limits in parenchymal echogenicity. Portal vein is patent on color Doppler imaging with normal direction of blood flow towards the liver.   IMPRESSION: Negative right upper quadrant ultrasound.  Assessment/plan: Problem List Items Addressed This Visit   None Visit Diagnoses     Chronic hepatitis C with hepatic coma (HCC)    -  Primary   Relevant Orders   Hepatitis C RNA quantitative ([facility])   CBC   Comp Met (CMET)        #chronic hep c  Prior treatment: no GT: 1a Evidence of cirrhosis: no based on history and elastography Interested in treatment Potential DDI: none  Duration of infection unknown. First dx 04/2021. No risk factors really outside of working as care provider. I advised her that roughly 3 in 10 patients can clear hep c on their own within 6-12 months. And given normal liver health and no really other risk for accelerated liver damage, I would recommend  rechecking hep c serology in around 6 months. If this is negative (viral quantification), then I would say she has cleared it and not needing treatment   -labs -- cbc, cmp, hep rna to be done around end of 10/2021 -see me 2 weeks after labs done -if still needing treatment based on labs, plan 8 weeks of mavyret    -discussed natural progression of hep c, transmission (avoid sharing personal hygiene equipment) -discussed avoid toxin like etoh and excessive acetamaminphen (no more than 2 gram a day) -discussed healthy life style and good glucose control -discussed avoiding eating raw sea food -discussed we can treat hep c but can be reinfected -discussed hepatitis coinfection and vaccination       Follow-up: Return in about 6 months (around 12/13/2021).  Jabier Mutton, [facility] [phone] pager   [phone] cell 06/15/2021, 10:22 AM

## 2021-07-07 ENCOUNTER — Other Ambulatory Visit: Payer: Self-pay

## 2021-07-16 NOTE — Progress Notes (Signed)
Established Patient Office Visit  Subjective:  Patient ID: Sally Reid, female    DOB: Jan 11, 1961  Age: 60 y.o. MRN: [id number]  CC: Primary care follow-up  HPI Sally Reid presents for primary care follow-up and obesity and hypertension.  On arrival blood pressure is elevated 143/78.  Patient is on hydrochlorothiazide 25 mg daily.  She is skipping lunch eating Carnation drinks for breakfast and baked chicken and salads for dinner.  She now has insurance with [employer].  She missed her orthopedic referral because of her ankle pain because she had a high co-pay.  She smokes 1 cigarette daily still.  She has restless legs at night and does sleeps very fitfully and is very tired fatigue during the day.  She is due up a Pap smear.  She also needs a shingles vaccine she agrees to receive the first 1 at this visit.   Past Medical History:  Diagnosis Date   Arthritis    Hypertension     Past Surgical History:  Procedure Laterality Date   ABDOMINAL HYSTERECTOMY     2005   ANKLE FRACTURE SURGERY Left    arthroscopy of left knee  2008   for cartilage surgery   CESAREAN SECTION     2 previous   LEG SURGERY      Family History  Problem Relation Age of Onset   Diabetes Mother    Hypertension Mother    Lymphoma Brother     Social History   Socioeconomic History   Marital status: Married    Spouse name: Not on file   Number of children: Not on file   Years of education: Not on file   Highest education level: Not on file  Occupational History   Occupation: unemployed    Comment: without health insurance  Tobacco Use   Smoking status: Some Days    Packs/day: 0.25    Types: Cigarettes   Smokeless tobacco: Never  Vaping Use   Vaping Use: Never used  Substance and Sexual Activity   Alcohol use: Yes    Comment: occassionally   Drug use: No   Sexual activity: Yes    Birth control/protection: Surgical  Other Topics Concern   Not on file  Social History Narrative   Lives  in [place].   Had 2 children( two boys 19 and 29).   Living with fiancee.   Social Determinants of Health   Financial Resource Strain: Not on file  Food Insecurity: Not on file  Transportation Needs: Not on file  Physical Activity: Not on file  Stress: Not on file  Social Connections: Not on file  Intimate Partner Violence: Not on file    Outpatient Medications Prior to Visit  Medication Sig Dispense Refill   Apple Cider Vinegar 500 MG TABS Take 1 each by mouth.     diclofenac Sodium (VOLTAREN) 1 % GEL Apply 2 g topically 4 (four) times daily. To left ankle 50 g 1   hydrochlorothiazide (HYDRODIURIL) 25 MG tablet Take 1 tablet (25 mg total) by mouth daily. 90 tablet 1   No facility-administered medications prior to visit.    No Known Allergies  ROS Review of Systems  Constitutional: Negative.   HENT: Negative.  Negative for ear pain, postnasal drip, rhinorrhea, sinus pressure, sore throat, trouble swallowing and voice change.   Eyes: Negative.   Respiratory: Negative.  Negative for apnea, cough, choking, chest tightness, shortness of breath, wheezing and stridor.   Cardiovascular: Negative.  Negative for chest pain, palpitations  and leg swelling.  Gastrointestinal: Negative.  Negative for abdominal distention, abdominal pain, nausea and vomiting.  Genitourinary: Negative.   Musculoskeletal:  Negative for arthralgias and myalgias.       Left lower ankle pain  Skin: Negative.  Negative for rash.  Allergic/Immunologic: Negative.  Negative for environmental allergies and food allergies.  Neurological: Negative.  Negative for dizziness, syncope, weakness and headaches.  Hematological: Negative.  Negative for adenopathy. Does not bruise/bleed easily.  Psychiatric/Behavioral: Negative.  Negative for agitation and sleep disturbance. The patient is not nervous/anxious.      Objective:    Physical Exam Vitals reviewed.  Constitutional:      Appearance: Normal appearance. She  is well-developed. She is obese. She is not diaphoretic.  HENT:     Head: Normocephalic and atraumatic.     Nose: No nasal deformity, septal deviation, mucosal edema or rhinorrhea.     Right Sinus: No maxillary sinus tenderness or frontal sinus tenderness.     Left Sinus: No maxillary sinus tenderness or frontal sinus tenderness.     Mouth/Throat:     Pharynx: No oropharyngeal exudate.  Eyes:     General: No scleral icterus.    Conjunctiva/sclera: Conjunctivae normal.     Pupils: Pupils are equal, round, and reactive to light.  Neck:     Thyroid: No thyromegaly.     Vascular: No carotid bruit or JVD.     Trachea: Trachea normal. No tracheal tenderness or tracheal deviation.  Cardiovascular:     Rate and Rhythm: Normal rate and regular rhythm.     Chest Wall: PMI is not displaced.     Pulses: Normal pulses. No decreased pulses.     Heart sounds: Normal heart sounds, S1 normal and S2 normal. Heart sounds not distant. No murmur heard. No systolic murmur is present.  No diastolic murmur is present.    No friction rub. No gallop. No S3 or S4 sounds.  Pulmonary:     Effort: No tachypnea, accessory muscle usage or respiratory distress.     Breath sounds: No stridor. No decreased breath sounds, wheezing, rhonchi or rales.  Chest:     Chest wall: No tenderness.  Abdominal:     General: Bowel sounds are normal. There is no distension.     Palpations: Abdomen is soft. Abdomen is not rigid.     Tenderness: There is no abdominal tenderness. There is no guarding or rebound.  Musculoskeletal:        General: Normal range of motion.     Cervical back: Normal range of motion and neck supple. No edema, erythema or rigidity. No muscular tenderness. Normal range of motion.  Lymphadenopathy:     Head:     Right side of head: No submental or submandibular adenopathy.     Left side of head: No submental or submandibular adenopathy.     Cervical: No cervical adenopathy.  Skin:    General: Skin is  warm and dry.     Coloration: Skin is not pale.     Findings: No rash.     Nails: There is no clubbing.  Neurological:     Mental Status: She is alert and oriented to person, place, and time.     Sensory: No sensory deficit.  Psychiatric:        Speech: Speech normal.        Behavior: Behavior normal.    BP (!) 143/78    Pulse 73    Resp 16  Wt 240 lb 3.2 oz (109 kg)    SpO2 98%    BMI 48.51 kg/m  Wt Readings from Last 3 Encounters:  07/17/21 240 lb 3.2 oz (109 kg)  06/15/21 238 lb (108 kg)  05/03/21 234 lb (106.1 kg)     Health Maintenance Due  Topic Date Due   PAP SMEAR-Modifier  08/07/2014   COVID-19 Vaccine (4 - Booster for Pfizer series) 05/17/2020    There are no preventive care reminders to display for this patient.  Lab Results  Component Value Date   TSH 0.42 08/25/2018   Lab Results  Component Value Date   WBC 7.2 04/07/2021   HGB 14.0 04/07/2021   HCT 41.6 04/07/2021   MCV 87.9 04/07/2021   PLT 225 04/07/2021   Lab Results  Component Value Date   NA 139 04/07/2021   K 3.9 04/07/2021   CO2 26 04/07/2021   GLUCOSE 84 04/07/2021   BUN 14 04/07/2021   CREATININE 0.88 04/07/2021   BILITOT 0.4 04/07/2021   ALKPHOS 75 04/07/2021   AST 40 04/07/2021   ALT 40 04/07/2021   PROT 7.4 04/07/2021   ALBUMIN 3.5 04/07/2021   CALCIUM 9.3 04/07/2021   ANIONGAP 8 04/07/2021   Lab Results  Component Value Date   CHOL  04/10/2009    152        ATP III CLASSIFICATION:  <200     mg/dL   Desirable  938-101  mg/dL   Borderline High  >=751    mg/dL   High          Lab Results  Component Value Date   HDL 53 04/10/2009   Lab Results  Component Value Date   LDLCALC  04/10/2009    81        Total Cholesterol/HDL:CHD Risk Coronary Heart Disease Risk Table                     Men   Women  1/2 Average Risk   3.4   3.3  Average Risk       5.0   4.4  2 X Average Risk   9.6   7.1  3 X Average Risk  23.4   11.0        Use the calculated Patient Ratio above  and the CHD Risk Table to determine the patient's CHD Risk.        ATP III CLASSIFICATION (LDL):  <100     mg/dL   Optimal  025-852  mg/dL   Near or Above                    Optimal  130-159  mg/dL   Borderline  778-242  mg/dL   High  >353     mg/dL   Very High   Lab Results  Component Value Date   TRIG 90 04/10/2009   Lab Results  Component Value Date   CHOLHDL 2.9 04/10/2009   Lab Results  Component Value Date   HGBA1C  04/11/2009    5.2 (NOTE) The ADA recommends the following therapeutic goal for glycemic control related to Hgb A1c measurement: Goal of therapy: <6.5 Hgb A1c  Reference: American Diabetes Association: Clinical Practice Recommendations 2010, Diabetes Care, 2010, 33: (Suppl  1).      Assessment & Plan:   Problem List Items Addressed This Visit       Cardiovascular and Mediastinum   Essential hypertension    Blood pressure not  at goal plan to change to chlorthalidone 25 mg daily and to follow a healthier diet we gave her dietary instructions      Relevant Medications   chlorthalidone (HYGROTON) 25 MG tablet     Other   Morbid obesity with BMI of 45.0-49.9, adult (HCC)    Morbid obesity with elevated BMI of 47.7  This is contributing to her pain in the lower extremities  Patient was given dietary recommendations      Left leg pain - Primary   Relevant Orders   Ambulatory referral to Orthopedic Surgery   History of ankle fracture    Referral to orthopedics was made      Relevant Orders   Ambulatory referral to Orthopedic Surgery   Tobacco use       Current smoking consumption amount: 3 to 5 cigarettes a day  Dicsussion on advise to quit smoking and smoking impacts: Cardiovascular impacts  Patient's willingness to quit: Willing to quit  Methods to quit smoking discussed: Nicotine replacement  Medication management of smoking session drugs discussed: Nicotine replacement lozenge  Resources provided:  AVS   Setting quit date not  established  Follow-up arranged 3 months   Time spent counseling the patient: 5 minutes       Other Visit Diagnoses     Hypersomnolence       Relevant Orders   Home sleep test   Sleep disturbance       Relevant Orders   Home sleep test       Meds ordered this encounter  Medications   chlorthalidone (HYGROTON) 25 MG tablet    Sig: Take 1 tablet (25 mg total) by mouth daily.    Dispense:  60 tablet    Refill:  4    Follow-up: Return in about 2 months (around 09/14/2021).    Shan LevansPatrick Sharonda Llamas, MD

## 2021-07-17 ENCOUNTER — Other Ambulatory Visit: Payer: Self-pay

## 2021-07-17 ENCOUNTER — Ambulatory Visit: Payer: Managed Care, Other (non HMO) | Attending: Critical Care Medicine | Admitting: Critical Care Medicine

## 2021-07-17 ENCOUNTER — Encounter: Payer: Self-pay | Admitting: Critical Care Medicine

## 2021-07-17 VITALS — BP 143/78 | HR 73 | Resp 16 | Wt 240.2 lb

## 2021-07-17 DIAGNOSIS — G479 Sleep disorder, unspecified: Secondary | ICD-10-CM

## 2021-07-17 DIAGNOSIS — M79605 Pain in left leg: Secondary | ICD-10-CM | POA: Diagnosis not present

## 2021-07-17 DIAGNOSIS — Z23 Encounter for immunization: Secondary | ICD-10-CM | POA: Diagnosis not present

## 2021-07-17 DIAGNOSIS — Z8781 Personal history of (healed) traumatic fracture: Secondary | ICD-10-CM

## 2021-07-17 DIAGNOSIS — I10 Essential (primary) hypertension: Secondary | ICD-10-CM

## 2021-07-17 DIAGNOSIS — Z6841 Body Mass Index (BMI) 40.0 and over, adult: Secondary | ICD-10-CM

## 2021-07-17 DIAGNOSIS — Z720 Tobacco use: Secondary | ICD-10-CM

## 2021-07-17 DIAGNOSIS — F17210 Nicotine dependence, cigarettes, uncomplicated: Secondary | ICD-10-CM

## 2021-07-17 DIAGNOSIS — G4710 Hypersomnia, unspecified: Secondary | ICD-10-CM | POA: Diagnosis not present

## 2021-07-17 MED ORDER — CHLORTHALIDONE 25 MG PO TABS
25.0000 mg | ORAL_TABLET | Freq: Every day | ORAL | 4 refills | Status: DC
Start: 2021-07-17 — End: 2022-02-28
  Filled 2021-07-17 – 2021-07-27 (×2): qty 30, 30d supply, fill #0
  Filled 2021-09-05: qty 30, 30d supply, fill #1
  Filled 2021-10-09: qty 30, 30d supply, fill #2
  Filled 2021-11-06: qty 30, 30d supply, fill #3
  Filled 2021-12-06: qty 30, 30d supply, fill #4

## 2021-07-17 NOTE — Assessment & Plan Note (Signed)
Blood pressure not at goal plan to change to chlorthalidone 25 mg daily and to follow a healthier diet we gave her dietary instructions

## 2021-07-17 NOTE — Assessment & Plan Note (Signed)
Referral to orthopedics was made 

## 2021-07-17 NOTE — Patient Instructions (Signed)
Stop hydrochlorthiazide and start chlorthalidone one daily 25mg  , this should control your blood pressure better  We discussed eating a healthier diet, see attachment  A zoster shingrex vaccine was given, return 6-8 weeks for second vaccine  A pap smear will be obtained by one of our providers at our clinic  A home sleep study will be obtained  Referral to orthopedic surgery will be obtained  Stop last cigarette as we discussed  Return Dr 2 months

## 2021-07-17 NOTE — Progress Notes (Signed)
Pt states she wants to talk to the doctor about weight loss.

## 2021-07-17 NOTE — Assessment & Plan Note (Signed)
Morbid obesity with elevated BMI of 47.7  This is contributing to her pain in the lower extremities  Patient was given dietary recommendations 

## 2021-07-17 NOTE — Assessment & Plan Note (Signed)
° ° °•   Current smoking consumption amount: 3 to 5 cigarettes a day  . Dicsussion on advise to quit smoking and smoking impacts: Cardiovascular impacts  . Patient's willingness to quit: Willing to quit  . Methods to quit smoking discussed: Nicotine replacement  . Medication management of smoking session drugs discussed: Nicotine replacement lozenge  . Resources provided:  AVS   . Setting quit date not established  . Follow-up arranged 3 months   Time spent counseling the patient: 5 minutes  

## 2021-07-24 ENCOUNTER — Ambulatory Visit (INDEPENDENT_AMBULATORY_CARE_PROVIDER_SITE_OTHER): Payer: Managed Care, Other (non HMO) | Admitting: Orthopedic Surgery

## 2021-07-24 ENCOUNTER — Ambulatory Visit (INDEPENDENT_AMBULATORY_CARE_PROVIDER_SITE_OTHER): Payer: Managed Care, Other (non HMO)

## 2021-07-24 ENCOUNTER — Other Ambulatory Visit: Payer: Self-pay

## 2021-07-24 DIAGNOSIS — G8929 Other chronic pain: Secondary | ICD-10-CM

## 2021-07-24 DIAGNOSIS — M25572 Pain in left ankle and joints of left foot: Secondary | ICD-10-CM

## 2021-07-24 DIAGNOSIS — M25562 Pain in left knee: Secondary | ICD-10-CM

## 2021-07-25 ENCOUNTER — Encounter: Payer: Self-pay | Admitting: Orthopedic Surgery

## 2021-07-25 DIAGNOSIS — M25572 Pain in left ankle and joints of left foot: Secondary | ICD-10-CM | POA: Diagnosis not present

## 2021-07-25 MED ORDER — METHYLPREDNISOLONE ACETATE 40 MG/ML IJ SUSP
40.0000 mg | INTRAMUSCULAR | Status: AC | PRN
  Administered 2021-07-25: 40 mg via INTRA_ARTICULAR

## 2021-07-25 MED ORDER — LIDOCAINE HCL 1 % IJ SOLN
2.0000 mL | INTRAMUSCULAR | Status: AC | PRN
  Administered 2021-07-25: 2 mL

## 2021-07-25 NOTE — Progress Notes (Signed)
Office Visit Note   [name]           Date of Birth: 1961/02/24           MRN: [id number] Visit Date: 07/24/2021              Requested by: Storm Frisk, MD [address] [place],  [place] [address] PCP: Storm Frisk, MD  Chief Complaint  Patient presents with   Left Ankle - Pain      HPI: Patient is a 61 year old woman who presents complaining of left knee and left ankle pain that has been present for several months.  Patient states that she underwent open reduction internal fixation of her ankle fracture in approximately 2013.  Patient has been using Voltaren gel and Tylenol.  Patient states she has had arthroscopic intervention in the left knee for torn ligaments.  Knee arthroscopy in approximately 2007.  Assessment & Plan: Visit Diagnoses:  1. Pain in left ankle and joints of left foot   2. Chronic pain of left knee     Plan: The left ankle was injected she tolerated this well reevaluate in 4 weeks.  Patient may also benefit from a knee injection.  Follow-Up Instructions: Return in about 4 weeks (around 08/21/2021).   Ortho Exam  Patient is alert, oriented, no adenopathy, well-dressed, normal affect, normal respiratory effort. Examination patient has some mild crepitation with range of motion of the left knee collaterals and cruciates are stable.  Medial lateral joint lines are nontender to palpation.  Examination the ankle patient is maximally tender to palpation over the calcified area of the syndesmosis.  She is also tender to palpation anteriorly over the ankle.  There is swelling around the ankle.  Imaging: XR Ankle Complete Left  Result Date: 07/25/2021 2 view radiographs of the left ankle shows previous internal fixation of the fibula and tibia for bimalleolar fracture there is no hardware failure the mortise is congruent there is calcification of the syndesmosis.  XR Tibia/Fibula Left  Result Date: 07/25/2021 2 view radiographs of the  left tibia and fibula shows no bony abnormalities of the tibia or fibula.  There is mild arthritic changes in the patellofemoral joint as well as lateral joint line.  There is no joint space narrowing.  No images are attached to the encounter.  Labs: Lab Results  Component Value Date   HGBA1C  04/11/2009    5.2 (NOTE) The ADA recommends the following therapeutic goal for glycemic control related to Hgb A1c measurement: Goal of therapy: <6.5 Hgb A1c  Reference: American Diabetes Association: Clinical Practice Recommendations 2010, Diabetes Care, 2010, 33: (Suppl  1).   REPTSTATUS 03/15/2020 FINAL 03/14/2020   CULT (A) 03/14/2020    <10,000 COLONIES/mL INSIGNIFICANT GROWTH Performed at [facility], [address], [place], [place] [address]      Lab Results  Component Value Date   ALBUMIN 3.5 04/07/2021   ALBUMIN 3.5 03/14/2020   ALBUMIN 3.6 11/28/2015    Lab Results  Component Value Date   MG 1.9 04/11/2009   No results found for: VD25OH  No results found for: PREALBUMIN CBC EXTENDED Latest Ref Rng & Units 04/07/2021 03/14/2020 12/17/2018  WBC 4.0 - 10.5 K/uL 7.2 6.0 6.6  RBC 3.87 - 5.11 MIL/uL 4.73 5.04 4.87  HGB 12.0 - 15.0 g/dL 29.7 98.9 21.1  HCT 94.1 - 46.0 % 41.6 43.8 42.8  PLT 150 - 400 K/uL 225 212 231  NEUTROABS 1.7 -  7.7 K/uL 2.5 2.4 -  LYMPHSABS 0.7 - 4.0 K/uL 3.9 3.0 -     There is no height or weight on file to calculate BMI.  Orders:  Orders Placed This Encounter  Procedures   XR Tibia/Fibula Left   XR Ankle Complete Left   No orders of the defined types were placed in this encounter.    Procedures: Medium Joint Inj: L ankle on 07/25/2021 7:06 AM Indications: pain and diagnostic evaluation Details: 22 G 1.5 in needle, anteromedial approach Medications: 2 mL lidocaine 1 %; 40 mg methylPREDNISolone acetate 40 MG/ML Outcome: tolerated well, no immediate complications Procedure, treatment alternatives, risks and benefits explained, specific  risks discussed. Consent was given by the patient. Immediately prior to procedure a time out was called to verify the correct patient, procedure, equipment, support staff and site/side marked as required. Patient was prepped and draped in the usual sterile fashion.     Clinical Data: No additional findings.  ROS:  All other systems negative, except as noted in the HPI. Review of Systems  Objective: Vital Signs: There were no vitals taken for this visit.  Specialty Comments:  No specialty comments available.  PMFS History: Patient Active Problem List   Diagnosis Date Noted   Chronic hepatitis C without hepatic coma (HCC) 04/21/2021   Left leg pain 04/18/2021   History of ankle fracture 04/18/2021   Tobacco use 04/18/2021   Essential hypertension 03/23/2020   Fatty liver 03/23/2020   Morbid obesity with BMI of 45.0-49.9, adult (HCC) 03/23/2020   Multiple thyroid nodules 07/14/2018   Past Medical History:  Diagnosis Date   Arthritis    Hypertension     Family History  Problem Relation Age of Onset   Diabetes Mother    Hypertension Mother    Lymphoma Brother     Past Surgical History:  Procedure Laterality Date   ABDOMINAL HYSTERECTOMY     2005   ANKLE FRACTURE SURGERY Left    arthroscopy of left knee  2008   for cartilage surgery   CESAREAN SECTION     2 previous   LEG SURGERY     Social History   Occupational History   Occupation: unemployed    Comment: without health insurance  Tobacco Use   Smoking status: Some Days    Packs/day: 0.25    Types: Cigarettes   Smokeless tobacco: Never  Vaping Use   Vaping Use: Never used  Substance and Sexual Activity   Alcohol use: Yes    Comment: occassionally   Drug use: No   Sexual activity: Yes    Birth control/protection: Surgical

## 2021-07-27 ENCOUNTER — Other Ambulatory Visit: Payer: Self-pay

## 2021-08-21 ENCOUNTER — Ambulatory Visit (INDEPENDENT_AMBULATORY_CARE_PROVIDER_SITE_OTHER): Payer: Managed Care, Other (non HMO) | Admitting: Orthopedic Surgery

## 2021-08-21 ENCOUNTER — Ambulatory Visit (INDEPENDENT_AMBULATORY_CARE_PROVIDER_SITE_OTHER): Payer: Managed Care, Other (non HMO)

## 2021-08-21 DIAGNOSIS — M79605 Pain in left leg: Secondary | ICD-10-CM

## 2021-08-21 DIAGNOSIS — M5116 Intervertebral disc disorders with radiculopathy, lumbar region: Secondary | ICD-10-CM | POA: Diagnosis not present

## 2021-08-21 DIAGNOSIS — M25572 Pain in left ankle and joints of left foot: Secondary | ICD-10-CM | POA: Diagnosis not present

## 2021-08-21 MED ORDER — PREDNISONE 10 MG PO TABS: 10.0000 mg | ORAL_TABLET | Freq: Every day | ORAL | 0 refills | Status: DC

## 2021-08-27 ENCOUNTER — Encounter: Payer: Self-pay | Admitting: Orthopedic Surgery

## 2021-08-27 NOTE — Progress Notes (Signed)
? ?Office Visit Note ?  ?[name]           ?Date of Birth: May 04, 1961           ?MRN: [id number] ?Visit Date: 08/21/2021 ?             ?Requested by: Storm Frisk, MD ?[address] ?[address] ?[place],  [place] [address] ?PCP: Storm Frisk, MD ? ?Chief Complaint  ?Patient presents with  ? Left Ankle - Follow-up  ? Left Leg - Pain  ? ? ? ? ?HPI: ?Patient is a 61 year old woman who presents complaining of left-sided radicular pain as well as left ankle pain.  She is status post open reduction of the fixation of the left ankle fracture 10 years ago and 1 month status post steroid injection in the left ankle.  She states the injection worked well but she did have headaches for several days.  Patient states that her symptoms are worse now radiating from the lateral buttocks  down to the lateral foot.  She has numbness and tingling in her foot denies any back pain.   ? ?Assessment & Plan: ?Visit Diagnoses:  ?1. Pain in left leg   ?2. Intervertebral disc disorders with radiculopathy, lumbar region   ? ? ?Plan: We will start on prednisone 10 mg with breakfast and reevaluate in 4 weeks.  Patient may need an MRI scan of her lumbar spine. ? ?Follow-Up Instructions: Return in about 4 weeks (around 09/18/2021).  ? ?Ortho Exam ? ?Patient is alert, oriented, no adenopathy, well-dressed, normal affect, normal respiratory effort. ?Examination patient has pain with a straight leg raise on the left motor strength is 5/5.  Radiographs do show some degenerative disc disease of the lumbar spine with calcification of the aorta about 3 cm in diameter. ? ?Imaging: ?No results found. ?No images are attached to the encounter. ? ?Labs: ?Lab Results  ?Component Value Date  ? HGBA1C  04/11/2009  ?  5.2 ?(NOTE) The ADA recommends the following therapeutic goal for glycemic control related to Hgb A1c measurement: Goal of therapy: <6.5 Hgb A1c  Reference: American Diabetes Association: Clinical Practice Recommendations 2010,  Diabetes Care, 2010, 33: (Suppl ? 1).  ? REPTSTATUS 03/15/2020 FINAL 03/14/2020  ? CULT (A) 03/14/2020  ?  <10,000 COLONIES/mL INSIGNIFICANT GROWTH ?Performed at [facility], [address], [place], [place] [address] ?  ? ? ? ?Lab Results  ?Component Value Date  ? ALBUMIN 3.5 04/07/2021  ? ALBUMIN 3.5 03/14/2020  ? ALBUMIN 3.6 11/28/2015  ? ? ?Lab Results  ?Component Value Date  ? MG 1.9 04/11/2009  ? ?No results found for: VD25OH ? ?No results found for: PREALBUMIN ? ?  Latest Ref Rng & Units 04/07/2021  ?  7:55 PM 03/14/2020  ?  8:37 AM 12/17/2018  ?  9:54 AM  ?CBC EXTENDED  ?WBC 4.0 - 10.5 K/uL 7.2   6.0   6.6    ?RBC 3.87 - 5.11 MIL/uL 4.73   5.04   4.87    ?Hemoglobin 12.0 - 15.0 g/dL 25.3   66.4   40.3    ?HCT 36.0 - 46.0 % 41.6   43.8   42.8    ?Platelets 150 - 400 K/uL 225   212   231    ?NEUT# 1.7 - 7.7 K/uL 2.5   2.4     ?Lymph# 0.7 - 4.0 K/uL 3.9   3.0     ? ? ? ?There is no  height or weight on file to calculate BMI. ? ?Orders:  ?Orders Placed This Encounter  ?Procedures  ? XR Lumbar Spine 2-3 Views  ? ?Meds ordered this encounter  ?Medications  ? predniSONE (DELTASONE) 10 MG tablet  ?  Sig: Take 1 tablet (10 mg total) by mouth daily with breakfast.  ?  Dispense:  30 tablet  ?  Refill:  0  ? ? ? Procedures: ?No procedures performed ? ?Clinical Data: ?No additional findings. ? ?ROS: ? ?All other systems negative, except as noted in the HPI. ?Review of Systems ? ?Objective: ?Vital Signs: There were no vitals taken for this visit. ? ?Specialty Comments:  ?No specialty comments available. ? ?PMFS History: ?Patient Active Problem List  ? Diagnosis Date Noted  ? Chronic hepatitis C without hepatic coma (HCC) 04/21/2021  ? Left leg pain 04/18/2021  ? History of ankle fracture 04/18/2021  ? Tobacco use 04/18/2021  ? Essential hypertension 03/23/2020  ? Fatty liver 03/23/2020  ? Morbid obesity with BMI of 45.0-49.9, adult (HCC) 03/23/2020  ? Multiple thyroid nodules 07/14/2018  ? ?Past Medical History:   ?Diagnosis Date  ? Arthritis   ? Hypertension   ?  ?Family History  ?Problem Relation Age of Onset  ? Diabetes Mother   ? Hypertension Mother   ? Lymphoma Brother   ?  ?Past Surgical History:  ?Procedure Laterality Date  ? ABDOMINAL HYSTERECTOMY    ? 2005  ? ANKLE FRACTURE SURGERY Left   ? arthroscopy of left knee  2008  ? for cartilage surgery  ? CESAREAN SECTION    ? 2 previous  ? LEG SURGERY    ? ?Social History  ? ?Occupational History  ? Occupation: unemployed  ?  Comment: without health insurance  ?Tobacco Use  ? Smoking status: Some Days  ?  Packs/day: 0.25  ?  Types: Cigarettes  ? Smokeless tobacco: Never  ?Vaping Use  ? Vaping Use: Never used  ?Substance and Sexual Activity  ? Alcohol use: Yes  ?  Comment: occassionally  ? Drug use: No  ? Sexual activity: Yes  ?  Birth control/protection: Surgical  ? ? ? ? ? ?

## 2021-09-05 ENCOUNTER — Other Ambulatory Visit: Payer: Self-pay

## 2021-09-06 ENCOUNTER — Other Ambulatory Visit: Payer: Self-pay

## 2021-09-18 ENCOUNTER — Ambulatory Visit (INDEPENDENT_AMBULATORY_CARE_PROVIDER_SITE_OTHER): Payer: Managed Care, Other (non HMO) | Admitting: Orthopedic Surgery

## 2021-09-18 DIAGNOSIS — M5116 Intervertebral disc disorders with radiculopathy, lumbar region: Secondary | ICD-10-CM | POA: Diagnosis not present

## 2021-09-25 ENCOUNTER — Encounter: Payer: Self-pay | Admitting: Orthopedic Surgery

## 2021-09-25 NOTE — Progress Notes (Signed)
? ?Office Visit Note ?  ?[name]           ?Date of Birth: Nov 27, 1960           ?MRN: [id number] ?Visit Date: 09/18/2021 ?             ?Requested by: Storm Frisk, MD ?[address] ?[address] ?[place],  [place] [address] ?PCP: Storm Frisk, MD ? ?Chief Complaint  ?Patient presents with  ? Lower Back - Follow-up  ? ? ? ? ?HPI: ?Patient is a 61 year old gentleman who presents with left-sided lumbar radicular pain radiating to the lateral left ankle.  Patient was started on prednisone 10 mg with breakfast states that this did help a little but he still has radicular symptoms down the left lower extremity. ? ?Assessment & Plan: ?Visit Diagnoses:  ?1. Intervertebral disc disorders with radiculopathy, lumbar region   ? ? ?Plan: Will request an MRI scan to further evaluate the lumbar spine to see if an epidural steroid injection would help improve his radicular symptoms.  Patient was instructed in how to wean off the prednisone. ? ?Follow-Up Instructions: Return in about 4 weeks (around 10/16/2021).  ? ?Ortho Exam ? ?Patient is alert, oriented, no adenopathy, well-dressed, normal affect, normal respiratory effort. ?Examination patient has persistent left-sided radicular pain he has a negative straight leg raise he has no focal motor weakness. ? ?Imaging: ?No results found. ?No images are attached to the encounter. ? ?Labs: ?Lab Results  ?Component Value Date  ? HGBA1C  04/11/2009  ?  5.2 ?(NOTE) The ADA recommends the following therapeutic goal for glycemic control related to Hgb A1c measurement: Goal of therapy: <6.5 Hgb A1c  Reference: American Diabetes Association: Clinical Practice Recommendations 2010, Diabetes Care, 2010, 33: (Suppl ? 1).  ? REPTSTATUS 03/15/2020 FINAL 03/14/2020  ? CULT (A) 03/14/2020  ?  <10,000 COLONIES/mL INSIGNIFICANT GROWTH ?Performed at [facility], [address], [place], [place] [address] ?  ? ? ? ?Lab Results  ?Component Value Date  ? ALBUMIN 3.5 04/07/2021  ?  ALBUMIN 3.5 03/14/2020  ? ALBUMIN 3.6 11/28/2015  ? ? ?Lab Results  ?Component Value Date  ? MG 1.9 04/11/2009  ? ?No results found for: VD25OH ? ?No results found for: PREALBUMIN ? ?  Latest Ref Rng & Units 04/07/2021  ?  7:55 PM 03/14/2020  ?  8:37 AM 12/17/2018  ?  9:54 AM  ?CBC EXTENDED  ?WBC 4.0 - 10.5 K/uL 7.2   6.0   6.6    ?RBC 3.87 - 5.11 MIL/uL 4.73   5.04   4.87    ?Hemoglobin 12.0 - 15.0 g/dL 01.7   79.3   90.3    ?HCT 36.0 - 46.0 % 41.6   43.8   42.8    ?Platelets 150 - 400 K/uL 225   212   231    ?NEUT# 1.7 - 7.7 K/uL 2.5   2.4     ?Lymph# 0.7 - 4.0 K/uL 3.9   3.0     ? ? ? ?There is no height or weight on file to calculate BMI. ? ?Orders:  ?Orders Placed This Encounter  ?Procedures  ? MR Lumbar Spine w/o contrast  ? ?No orders of the defined types were placed in this encounter. ? ? ? Procedures: ?No procedures performed ? ?Clinical Data: ?No additional findings. ? ?ROS: ? ?All other systems negative, except as noted in the HPI. ?Review of Systems ? ?Objective: ?Vital Signs: There were  no vitals taken for this visit. ? ?Specialty Comments:  ?No specialty comments available. ? ?PMFS History: ?Patient Active Problem List  ? Diagnosis Date Noted  ? Chronic hepatitis C without hepatic coma (HCC) 04/21/2021  ? Left leg pain 04/18/2021  ? History of ankle fracture 04/18/2021  ? Tobacco use 04/18/2021  ? Essential hypertension 03/23/2020  ? Fatty liver 03/23/2020  ? Morbid obesity with BMI of 45.0-49.9, adult (HCC) 03/23/2020  ? Multiple thyroid nodules 07/14/2018  ? ?Past Medical History:  ?Diagnosis Date  ? Arthritis   ? Hypertension   ?  ?Family History  ?Problem Relation Age of Onset  ? Diabetes Mother   ? Hypertension Mother   ? Lymphoma Brother   ?  ?Past Surgical History:  ?Procedure Laterality Date  ? ABDOMINAL HYSTERECTOMY    ? 2005  ? ANKLE FRACTURE SURGERY Left   ? arthroscopy of left knee  2008  ? for cartilage surgery  ? CESAREAN SECTION    ? 2 previous  ? LEG SURGERY    ? ?Social History   ? ?Occupational History  ? Occupation: unemployed  ?  Comment: without health insurance  ?Tobacco Use  ? Smoking status: Some Days  ?  Packs/day: 0.25  ?  Types: Cigarettes  ? Smokeless tobacco: Never  ?Vaping Use  ? Vaping Use: Never used  ?Substance and Sexual Activity  ? Alcohol use: Yes  ?  Comment: occassionally  ? Drug use: No  ? Sexual activity: Yes  ?  Birth control/protection: Surgical  ? ? ? ? ? ?

## 2021-09-26 NOTE — Progress Notes (Signed)
? ?Established Patient Office Visit ? ?Subjective:  ?Patient ID: Sally Reid, female    DOB: [birth year]  Age: 61 y.o. MRN: [id number] ? ?CC: Primary care follow-up ? ?HPI ?07/17/21 ?[name] presents for primary care follow-up and obesity and hypertension.  On arrival blood pressure is elevated 143/78.  Patient is on hydrochlorothiazide 25 mg daily.  She is skipping lunch eating [employer] drinks for breakfast and baked chicken and salads for dinner.  She now has insurance with [employer] [place].  She missed her orthopedic referral because of her ankle pain because she had a high co-pay.  She smokes 1 cigarette daily still.  She has restless legs at night and does sleeps very fitfully and is very tired fatigue during the day.  She is due up a Pap smear.  She also needs a shingles vaccine she agrees to receive the first 1 at this visit. ? ? ?09/27/21 ?Patient seen in return follow-up and has been compliant with her blood pressure medication on arrival blood pressure is 136/82.  Patient states her left leg and lower back are improved but she has a follow-up MRI with orthopedics of the lower back.  Sleep study is yet to be performed.  She needs a Pap smear.  She is down to 1 cigarette daily.  She has no other complaints at this visit. ? ?Past Medical History:  ?Diagnosis Date  ? Arthritis   ? Hypertension   ? ? ?Past Surgical History:  ?Procedure Laterality Date  ? ABDOMINAL HYSTERECTOMY    ? 2005  ? ANKLE FRACTURE SURGERY Left   ? arthroscopy of left knee  2008  ? for cartilage surgery  ? CESAREAN SECTION    ? 2 previous  ? LEG SURGERY    ? ? ?Family History  ?Problem Relation Age of Onset  ? Diabetes Mother   ? Hypertension Mother   ? Lymphoma Brother   ? ? ?Social History  ? ?Socioeconomic History  ? Marital status: Married  ?  Spouse name: Not on file  ? Number of children: Not on file  ? Years of education: Not on file  ? Highest education level: Not on file  ?Occupational History  ? Occupation: unemployed  ?  Comment:  without health insurance  ?Tobacco Use  ? Smoking status: Some Days  ?  Packs/day: 0.25  ?  Types: Cigarettes  ? Smokeless tobacco: Never  ?Vaping Use  ? Vaping Use: Never used  ?Substance and Sexual Activity  ? Alcohol use: Yes  ?  Comment: occassionally  ? Drug use: No  ? Sexual activity: Yes  ?  Birth control/protection: Surgical  ?Other Topics Concern  ? Not on file  ?Social History Narrative  ? Lives in [place].  ? Had 2 children( two boys 45 and 29).  ? Living with fiancee.  ? ?Social Determinants of Health  ? ?Financial Resource Strain: Not on file  ?Food Insecurity: Not on file  ?Transportation Needs: Not on file  ?Physical Activity: Not on file  ?Stress: Not on file  ?Social Connections: Not on file  ?Intimate Partner Violence: Not on file  ? ? ?Outpatient Medications Prior to Visit  ?Medication Sig Dispense Refill  ? Apple Cider Vinegar 500 MG TABS Take 1 each by mouth.    ? chlorthalidone (HYGROTON) 25 MG tablet Take 1 tablet (25 mg total) by mouth daily. 60 tablet 4  ? diclofenac Sodium (VOLTAREN) 1 % GEL Apply 2 g topically 4 (four) times daily. To left ankle 50  g 1  ? predniSONE (DELTASONE) 10 MG tablet Take 1 tablet (10 mg total) by mouth daily with breakfast. (Patient not taking: Reported on 09/27/2021) 30 tablet 0  ? ?No facility-administered medications prior to visit.  ? ? ?No Known Allergies ? ?ROS ?Review of Systems  ?Constitutional: Negative.   ?HENT: Negative.  Negative for ear pain, postnasal drip, rhinorrhea, sinus pressure, sore throat, trouble swallowing and voice change.   ?Eyes: Negative.   ?Respiratory: Negative.  Negative for apnea, cough, choking, chest tightness, shortness of breath, wheezing and stridor.   ?Cardiovascular: Negative.  Negative for chest pain, palpitations and leg swelling.  ?Gastrointestinal: Negative.  Negative for abdominal distention, abdominal pain, nausea and vomiting.  ?Genitourinary: Negative.   ?Musculoskeletal:  Negative for arthralgias and myalgias.  ?      Left lower ankle pain  ?Skin: Negative.  Negative for rash.  ?Allergic/Immunologic: Negative.  Negative for environmental allergies and food allergies.  ?Neurological: Negative.  Negative for dizziness, syncope, weakness and headaches.  ?Hematological: Negative.  Negative for adenopathy. Does not bruise/bleed easily.  ?Psychiatric/Behavioral: Negative.  Negative for agitation and sleep disturbance. The patient is not nervous/anxious.   ? ?  ?Objective:  ?  ?Physical Exam ?Vitals reviewed.  ?Constitutional:   ?   Appearance: Normal appearance. She is well-developed. She is obese. She is not diaphoretic.  ?HENT:  ?   Head: Normocephalic and atraumatic.  ?   Nose: No nasal deformity, septal deviation, mucosal edema or rhinorrhea.  ?   Right Sinus: No maxillary sinus tenderness or frontal sinus tenderness.  ?   Left Sinus: No maxillary sinus tenderness or frontal sinus tenderness.  ?   Mouth/Throat:  ?   Pharynx: No oropharyngeal exudate.  ?Eyes:  ?   General: No scleral icterus. ?   Conjunctiva/sclera: Conjunctivae normal.  ?   Pupils: Pupils are equal, round, and reactive to light.  ?Neck:  ?   Thyroid: No thyromegaly.  ?   Vascular: No carotid bruit or JVD.  ?   Trachea: Trachea normal. No tracheal tenderness or tracheal deviation.  ?Cardiovascular:  ?   Rate and Rhythm: Normal rate and regular rhythm.  ?   Chest Wall: PMI is not displaced.  ?   Pulses: Normal pulses. No decreased pulses.  ?   Heart sounds: Normal heart sounds, S1 normal and S2 normal. Heart sounds not distant. No murmur heard. ?No systolic murmur is present.  ?No diastolic murmur is present.  ?  No friction rub. No gallop. No S3 or S4 sounds.  ?Pulmonary:  ?   Effort: No tachypnea, accessory muscle usage or respiratory distress.  ?   Breath sounds: No stridor. No decreased breath sounds, wheezing, rhonchi or rales.  ?Chest:  ?   Chest wall: No tenderness.  ?Abdominal:  ?   General: Bowel sounds are normal. There is no distension.  ?   Palpations:  Abdomen is soft. Abdomen is not rigid.  ?   Tenderness: There is no abdominal tenderness. There is no guarding or rebound.  ?Musculoskeletal:     ?   General: Normal range of motion.  ?   Cervical back: Normal range of motion and neck supple. No edema, erythema or rigidity. No muscular tenderness. Normal range of motion.  ?Lymphadenopathy:  ?   Head:  ?   Right side of head: No submental or submandibular adenopathy.  ?   Left side of head: No submental or submandibular adenopathy.  ?   Cervical: No cervical  adenopathy.  ?Skin: ?   General: Skin is warm and dry.  ?   Coloration: Skin is not pale.  ?   Findings: No rash.  ?   Nails: There is no clubbing.  ?Neurological:  ?   Mental Status: She is alert and oriented to person, place, and time.  ?   Sensory: No sensory deficit.  ?Psychiatric:     ?   Speech: Speech normal.     ?   Behavior: Behavior normal.  ? ? ?BP 136/82 (BP Location: Left Arm, Patient Position: Sitting, Cuff Size: Large)   Pulse 77   Temp 98.5 ?F (36.9 ?C) (Oral)   Resp 16   Wt 236 lb (107 kg)   SpO2 99%   BMI 47.67 kg/m?  ?Wt Readings from Last 3 Encounters:  ?09/27/21 236 lb (107 kg)  ?07/17/21 240 lb 3.2 oz (109 kg)  ?06/15/21 238 lb (108 kg)  ? ? ? ?Health Maintenance Due  ?Topic Date Due  ? PAP SMEAR-Modifier  08/07/2014  ? COVID-19 Vaccine (4 - Booster for Pfizer series) 05/17/2020  ? ? ?There are no preventive care reminders to display for this patient. ? ?Lab Results  ?Component Value Date  ? TSH 0.42 08/25/2018  ? ?Lab Results  ?Component Value Date  ? WBC 7.2 04/07/2021  ? HGB 14.0 04/07/2021  ? HCT 41.6 04/07/2021  ? MCV 87.9 04/07/2021  ? PLT 225 04/07/2021  ? ?Lab Results  ?Component Value Date  ? NA 139 04/07/2021  ? K 3.9 04/07/2021  ? CO2 26 04/07/2021  ? GLUCOSE 84 04/07/2021  ? BUN 14 04/07/2021  ? CREATININE 0.88 04/07/2021  ? BILITOT 0.4 04/07/2021  ? ALKPHOS 75 04/07/2021  ? AST 40 04/07/2021  ? ALT 40 04/07/2021  ? PROT 7.4 04/07/2021  ? ALBUMIN 3.5 04/07/2021  ? CALCIUM  9.3 04/07/2021  ? ANIONGAP 8 04/07/2021  ? ?Lab Results  ?Component Value Date  ? CHOL  04/10/2009  ?  152        ?ATP III CLASSIFICATION: ? <200     mg/dL   Desirable ? 200-239  mg/dL   Borderline High ? >=

## 2021-09-27 ENCOUNTER — Encounter: Payer: Self-pay | Admitting: Critical Care Medicine

## 2021-09-27 ENCOUNTER — Ambulatory Visit: Payer: Managed Care, Other (non HMO) | Attending: Critical Care Medicine | Admitting: Critical Care Medicine

## 2021-09-27 VITALS — BP 136/82 | HR 77 | Temp 98.5°F | Resp 16 | Wt 236.0 lb

## 2021-09-27 DIAGNOSIS — M79605 Pain in left leg: Secondary | ICD-10-CM | POA: Diagnosis not present

## 2021-09-27 DIAGNOSIS — I10 Essential (primary) hypertension: Secondary | ICD-10-CM

## 2021-09-27 DIAGNOSIS — Z6841 Body Mass Index (BMI) 40.0 and over, adult: Secondary | ICD-10-CM

## 2021-09-27 DIAGNOSIS — Z720 Tobacco use: Secondary | ICD-10-CM

## 2021-09-27 DIAGNOSIS — F17210 Nicotine dependence, cigarettes, uncomplicated: Secondary | ICD-10-CM

## 2021-09-27 DIAGNOSIS — Z23 Encounter for immunization: Secondary | ICD-10-CM | POA: Diagnosis not present

## 2021-09-27 NOTE — Assessment & Plan Note (Signed)
Leg pain possibly due to radiculopathy follow-up per orthopedics ?

## 2021-09-27 NOTE — Progress Notes (Signed)
F/u BP  ? ?Seeing Ortho for left side pain ?

## 2021-09-27 NOTE — Assessment & Plan Note (Addendum)
Blood pressure is under good control I given the patient a lifestyle medicine handout ? ?Check renal function ?

## 2021-09-27 NOTE — Assessment & Plan Note (Signed)
? ? ??   Current smoking consumption amount: 3 to 5 cigarettes a day  . Dicsussion on advise to quit smoking and smoking impacts: Cardiovascular impacts  . Patient's willingness to quit: Willing to quit  . Methods to quit smoking discussed: Nicotine replacement  . Medication management of smoking session drugs discussed: Nicotine replacement lozenge  . Resources provided:  AVS   . Setting quit date not established  . Follow-up arranged 3 months   Time spent counseling the patient: 5 minutes  

## 2021-09-27 NOTE — Patient Instructions (Signed)
We will order the home sleep study again this has not been processed ? ?Focus on reducing your cigarettes use the nicotine lozenge 2 mg as prescribed ? ?We will be scheduling you with a Pap smear with one of our providers ? ?Stay on chlorthalidone ? ?Metabolic panel will be obtained today ? ?Lifestyle handout was given follow the diet we discussed ? ?Return to see Dr. Delford Field 4 months ?

## 2021-09-27 NOTE — Assessment & Plan Note (Signed)
Gave the patient an exercise program and lifestyle medicine handout ?

## 2021-09-28 ENCOUNTER — Telehealth: Payer: Self-pay

## 2021-09-28 LAB — BASIC METABOLIC PANEL
BUN/Creatinine Ratio: 14 (ref 12–28)
BUN: 12 mg/dL (ref 8–27)
CO2: 27 mmol/L (ref 20–29)
Calcium: 9.7 mg/dL (ref 8.7–10.3)
Chloride: 102 mmol/L (ref 96–106)
Creatinine, Ser: 0.83 mg/dL (ref 0.57–1.00)
Glucose: 91 mg/dL (ref 70–99)
Potassium: 3.6 mmol/L (ref 3.5–5.2)
Sodium: 142 mmol/L (ref 134–144)
eGFR: 81 mL/min/{1.73_m2} (ref >59)

## 2021-09-28 NOTE — Telephone Encounter (Signed)
Pt was called and is aware of results, DOB was confirmed.  ?

## 2021-09-28 NOTE — Telephone Encounter (Signed)
-----   Message from Elsie Stain, MD sent at 09/28/2021  6:12 AM EDT ----- ?Let pt know kidney and potassium normal, continue chlorthaladone for BP ?

## 2021-09-30 ENCOUNTER — Other Ambulatory Visit: Payer: Managed Care, Other (non HMO)

## 2021-10-04 ENCOUNTER — Other Ambulatory Visit: Payer: Self-pay | Admitting: Orthopedic Surgery

## 2021-10-04 DIAGNOSIS — M5116 Intervertebral disc disorders with radiculopathy, lumbar region: Secondary | ICD-10-CM

## 2021-10-09 ENCOUNTER — Other Ambulatory Visit: Payer: Self-pay

## 2021-10-16 ENCOUNTER — Ambulatory Visit (INDEPENDENT_AMBULATORY_CARE_PROVIDER_SITE_OTHER): Payer: Managed Care, Other (non HMO) | Admitting: Rehabilitative and Restorative Service Providers"

## 2021-10-16 ENCOUNTER — Encounter: Payer: Self-pay | Admitting: Rehabilitative and Restorative Service Providers"

## 2021-10-16 DIAGNOSIS — M5416 Radiculopathy, lumbar region: Secondary | ICD-10-CM | POA: Diagnosis not present

## 2021-10-16 DIAGNOSIS — R293 Abnormal posture: Secondary | ICD-10-CM | POA: Diagnosis not present

## 2021-10-16 DIAGNOSIS — M6281 Muscle weakness (generalized): Secondary | ICD-10-CM

## 2021-10-16 DIAGNOSIS — R262 Difficulty in walking, not elsewhere classified: Secondary | ICD-10-CM

## 2021-10-16 DIAGNOSIS — M5459 Other low back pain: Secondary | ICD-10-CM | POA: Diagnosis not present

## 2021-10-16 NOTE — Therapy (Signed)
?OUTPATIENT PHYSICAL THERAPY THORACOLUMBAR EVALUATION ? ? ?Patient Name: Sally Reid ?MRN: [id number] ?DOB:12-05-1960, 61 y.o., female ?Today's Date: 10/16/2021 ? ? PT End of Session - 10/16/21 1624   ? ? Visit Number 1   ? Number of Visits 12   ? PT Start Time 1430   ? PT Stop Time 1515   ? PT Time Calculation (min) 45 min   ? Activity Tolerance Patient tolerated treatment well   ? Behavior During Therapy [facility] for tasks assessed/performed   ? ?  ?  ? ?  ? ? ?Past Medical History:  ?Diagnosis Date  ? Arthritis   ? Hypertension   ? ?Past Surgical History:  ?Procedure Laterality Date  ? ABDOMINAL HYSTERECTOMY    ? 2005  ? ANKLE FRACTURE SURGERY Left   ? arthroscopy of left knee  2008  ? for cartilage surgery  ? CESAREAN SECTION    ? 2 previous  ? LEG SURGERY    ? ?Patient Active Problem List  ? Diagnosis Date Noted  ? Chronic hepatitis C without hepatic coma (HCC) 04/21/2021  ? Left leg pain 04/18/2021  ? History of ankle fracture 04/18/2021  ? Tobacco use 04/18/2021  ? Essential hypertension 03/23/2020  ? Fatty liver 03/23/2020  ? Morbid obesity with BMI of 45.0-49.9, adult (HCC) 03/23/2020  ? Multiple thyroid nodules 07/14/2018  ? ? ?PCP: Storm Frisk, MD ? ?REFERRING PROVIDER: Nadara Mustard, MD ? ?REFERRING DIAG: M51.16 (ICD-10-CM) - Intervertebral disc disorders with radiculopathy, lumbar region  ? ?THERAPY DIAG:  ?Abnormal posture ? ?Difficulty in walking, not elsewhere classified ? ?Radiculopathy, lumbar region ? ?Other low back pain ? ?Muscle weakness (generalized) ? ?ONSET DATE: About 2 months with sciatica, chronic LBP. ? ?SUBJECTIVE:                                                                                                                                                                                          ? ?SUBJECTIVE STATEMENT: ?Sally Reid notes L sided leg pain as distal as the ankle.  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  Prolonged postures and walking are tough.  Sleep is  affected. ? ?PERTINENT HISTORY:  ?OA, HTN, previous L ankle fracture and L knee arthroscopy, obesity, chronic hepatitis C ? ?PAIN:  ?Are you having pain? Yes: NPRS scale: 4-6/10 ?Pain location: L leg as distal as the ankle ?Pain description: Ache and tingling leg and stabbing for the back ?Aggravating factors: Sleeping bothers the back, flexion and rotation, avoids lifting, prolonged postures ?Relieving factors: OTC pain medication, direct pressure on the back ? ? ?PRECAUTIONS: Back ? ?WEIGHT BEARING RESTRICTIONS No ? ?  FALLS:  ?Has patient fallen in last 6 months? No ? ?LIVING ENVIRONMENT: ?Lives with: lives with their family ?Lives in: House/apartment ?Stairs:  Has trouble with stairs due to her back ?Has following equipment at home: None ? 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 ?PLOF: Independent ? ?PATIENT GOALS Get rid of the leg pain, reduce overall pain and improve function ? ? ?OBJECTIVE:  ? ?DIAGNOSTIC FINDINGS:  ?2 view radiographs of the lumbar spine shows no scoliosis no pars defect.    ?There is facet arthropathy and degenerative disc disease. ? ?MRI ordered ? ?PATIENT SURVEYS:  ?FOTO 41 (Goal 58 in 11 visits) ? ?SCREENING FOR RED FLAGS: ?Bowel or bladder incontinence: No ?Spinal tumors: No ?Cauda equina syndrome: No ?Compression fracture: No ?Abdominal aneurysm: No ? ?COGNITION: ? Overall cognitive status: Within functional limits for tasks assessed   ?  ?SENSATION: ?Notes tingling but no numbness.  She does note some L UE tingling as well. ? ?MUSCLE LENGTH: ?Hamstrings: Right 25 deg; Left 25 deg ? ?POSTURE:  ?Mild flexion at the hips, IR and protracted shoulders and mild forward head. ? ?LUMBAR ROM:  ? ?Active  A/PROM  ?10/16/2021  ?Flexion   ?Extension 5  ?Right lateral flexion   ?Left lateral flexion   ?Right rotation   ?Left rotation   ? (Blank rows = not tested) ? ?LE ROM: ? ?Passive  Right ?10/16/2021 Left ?10/16/2021  ?Hip flexion 70 70  ?Hip extension    ?Hip abduction    ?Hip adduction     ?Hip internal rotation    ?Hip external rotation    ?Knee flexion    ?Knee extension    ?Ankle dorsiflexion    ?Ankle plantarflexion    ?Ankle inversion    ?Ankle eversion    ? (Blank rows = not tested) ? ?LE MMT: ? ?Strength Right ?10/16/2021 Left ?10/16/2021  ?Hip flexion    ?Hip extension    ?Hip abduction    ?Hip adduction    ?Hip internal rotation    ?Hip external rotation    ?Knee flexion    ?Knee extension    ?Ankle dorsiflexion    ?Ankle plantarflexion    ?Ankle inversion    ?Ankle eversion    ? (Blank rows = not tested) ? ?GAIT: ?Comments: Sally Reid notes difficulty walking more than a few minutes.  She notes using a shopping cart helps her walking endurance.  This is a sign of postural weakness. ? ? ? ?TODAY'S TREATMENT  ?Therapeutic Exercises: ? ?Lumbar extension AROM (hips forward) 10X 3 seconds (comfortable range) ?Shoulder blade pinches 10X 5 seconds ?Modified Thomas stretch (1 leg off table) 5X 20 seconds B ? ?Functional Activities: Review imaging with spine model, review log roll and golfer's lift, review exam findings and starter HEP. ? ? ?PATIENT EDUCATION:  ?Education details: See above ?Person educated: Patient ?Education method: Explanation, Demonstration, Verbal cues, and Handouts ?Education comprehension: verbalized understanding, returned demonstration, verbal cues required, tactile cues required, and needs further education ? ? ?HOME EXERCISE PROGRAM: ?Access Code: [email or web address] ?URL: [email or web address] ?Date: 10/16/2021 ?Prepared by: Pauletta Browns ? ?Exercises ?- Standing Lumbar Extension at Wall - Forearms  - 5 x daily - 7 x weekly - 1 sets - 5 reps - 3 seconds hold ?- Supine Lower Trunk Rotation  - 2 x daily - 7 x weekly - 1 sets - 10 reps - 5 seconds hold ?- Supine Figure 4 Piriformis Stretch  - 2 x daily - 7 x weekly - 1 sets - 4-5  reps - 20 seconds hold ?- Seated Table Hamstring Stretch  - 2 x daily - 7 x weekly - 1 sets - 4-5 reps - 20 seconds hold ?- Modified Thomas Stretch   - 2 x daily - 7 x weekly - 1 sets - 4-5 reps - 20 seconds hold ?- Standing Scapular Retraction  - 5 x daily - 7 x weekly - 1 sets - 5 reps - 5 second hold ? ?ASSESSMENT: ? ?CLINICAL IMPRESSION: ?Patient is a 61 y.o. female who was seen today for physical therapy evaluation and treatment for low back pain with L sided radiculopathy.  She has low back muscle guarding/spasm and very stiff B LE musculature.  She will benefit from skilled PT to meet the below listed goals. ? ? ?OBJECTIVE IMPAIRMENTS Abnormal gait, decreased activity tolerance, decreased endurance, decreased knowledge of condition, decreased mobility, difficulty walking, decreased ROM, decreased strength, decreased safety awareness, impaired perceived functional ability, increased muscle spasms, impaired flexibility, improper body mechanics, postural dysfunction, obesity, and pain.  ? ?ACTIVITY LIMITATIONS community activity, driving, occupation, and prolonged postures .  ? ?PERSONAL FACTORS OA, HTN, previous L ankle fracture and L knee arthroscopy, obesity, chronic hepatitis C are also affecting patient's functional outcome.  ? ? ?REHAB POTENTIAL: Good ? ?CLINICAL DECISION MAKING: Stable/uncomplicated ? ?EVALUATION COMPLEXITY: Low ? ? ?GOALS: ?Goals reviewed with patient? Yes ? ?SHORT TERM GOALS: Target date: 11/13/2021    (Remove Blue Hyperlink) ? ?Improve L leg pain to consistently 0-3/10 on the Numeric Pain Rating Scale. ?Baseline: 4-6/10 ?Goal status: INITIAL ? ?2.  Sally Reid will be able to demonstrate correct sitting, standing posture and correct bed mobility and lifting mechanics. ?Baseline: Just started 10/16/2021 ?Goal status: INITIAL ? ?3.  Improve trunk extension AROM to 15 degrees. ?Baseline: 5 degrees ?Goal status: INITIAL ? ?LONG TERM GOALS: Target date: 12/11/2021  (Remove Blue Hyperlink) ? ?Improve low back pain to consistently 0-3/10 on the Numeric Pain Rating Scale. ?Baseline: 4-6/10 ?Goal status: INITIAL ? ?2.  Improve FOTO to  58. ?Baseline: 41 ?Goal status: INITIAL ? ?3.  Improve B LE flexibility for hip flexors to 90 and hamstrings to 45 degrees. ?Baseline: 70 and 45 degrees, respectively ?Goal status: INITIAL ? ?4.  Improve spine strengt

## 2021-10-23 ENCOUNTER — Encounter: Payer: Self-pay | Admitting: Rehabilitative and Restorative Service Providers"

## 2021-10-23 ENCOUNTER — Ambulatory Visit (INDEPENDENT_AMBULATORY_CARE_PROVIDER_SITE_OTHER): Payer: Commercial Managed Care - HMO | Admitting: Rehabilitative and Restorative Service Providers"

## 2021-10-23 DIAGNOSIS — R293 Abnormal posture: Secondary | ICD-10-CM | POA: Diagnosis not present

## 2021-10-23 DIAGNOSIS — M6281 Muscle weakness (generalized): Secondary | ICD-10-CM

## 2021-10-23 DIAGNOSIS — R262 Difficulty in walking, not elsewhere classified: Secondary | ICD-10-CM

## 2021-10-23 DIAGNOSIS — M5416 Radiculopathy, lumbar region: Secondary | ICD-10-CM | POA: Diagnosis not present

## 2021-10-23 DIAGNOSIS — M5459 Other low back pain: Secondary | ICD-10-CM | POA: Diagnosis not present

## 2021-10-23 NOTE — Therapy (Signed)
OUTPATIENT PHYSICAL THERAPY TREATMENT NOTE   Patient Name: Sally BasquesMichelle Leaf MRN: [id number] DOB:12/01/1960, 61 y.o., female Today's Date: 10/23/2021  END OF SESSION:   PT End of Session - 10/23/21 1438     Visit Number 2    Number of Visits 12    PT Start Time 1431    PT Stop Time 1512    PT Time Calculation (min) 41 min    Activity Tolerance Patient limited by pain    Behavior [facility] for tasks assessed/performed             Past Medical History:  Diagnosis Date   Arthritis    Hypertension    Past Surgical History:  Procedure Laterality Date   ABDOMINAL HYSTERECTOMY     2005   ANKLE FRACTURE SURGERY Left    arthroscopy of left knee  2008   for cartilage surgery   CESAREAN SECTION     2 previous   LEG SURGERY     Patient Active Problem List   Diagnosis Date Noted   Chronic hepatitis C without hepatic coma (HCC) 04/21/2021   Left leg pain 04/18/2021   History of ankle fracture 04/18/2021   Tobacco use 04/18/2021   Essential hypertension 03/23/2020   Fatty liver 03/23/2020   Morbid obesity with BMI of 45.0-49.9, adult (HCC) 03/23/2020   Multiple thyroid nodules 07/14/2018    PCP: Storm FriskPatrick E Mariel Gaudin, MD   REFERRING PROVIDER: Nadara MustardMarcus V Duda, MD   REFERRING DIAG: M51.16 (ICD-10-CM) - Intervertebral disc disorders with radiculopathy, lumbar region   ONSET DATE: About 2 months with sciatica, chronic LBP.  THERAPY DIAG:  Abnormal posture  Difficulty in walking, not elsewhere classified  Radiculopathy, lumbar region  Other low back pain  Muscle weakness (generalized)  Rationale for Evaluation and Treatment Rehabilitation  PERTINENT HISTORY: OA, HTN, previous L ankle fracture and L knee arthroscopy, obesity, chronic hepatitis C  PRECAUTIONS: No surgery protocols  SUBJECTIVE: Pt indicated no better or worse since last visit.  Pt indicated having leg pain at night some worse. Pt indicated having some knee pain today upon arrival.   Pt indicated  exercises hurt to do and made back hurt more.   PAIN:  NPRS scale: 8/10 at worst today Pain location: Lt back upon arrival today, leg at night 10/10 Pain description: Ache and tingling leg and stabbing for the back Aggravating factors: nighttime, normal daily activity Relieving factors: OTC pain medication, sitting at night vs. Lying down at times.    OBJECTIVE:    DIAGNOSTIC FINDINGS:   10/16/2021 IE : 2 view radiographs of the lumbar spine shows no scoliosis no pars defect.    There is facet arthropathy and degenerative disc disease.    PATIENT SURVEYS:  10/16/2021 FOTO 41 (Goal 58 in 11 visits)   SCREENING FOR RED FLAGS: 10/16/2021 Bowel or bladder incontinence: No Spinal tumors: No Cauda equina syndrome: No Compression fracture: No Abdominal aneurysm: No   COGNITION: 10/16/2021 Overall cognitive status: Within functional limits for tasks assessed                          SENSATION: 10/16/2021 Notes tingling but no numbness.  She does note some Lt UE tingling as well.   MUSCLE LENGTH: 10/16/2021 Hamstrings: Right 25 deg; Left 25 deg   POSTURE:  10/16/2021 Mild flexion at the hips, IR and protracted shoulders and mild forward head.   LUMBAR ROM:    Active  A/PROM  10/16/2021  AROM 10/23/2021  Flexion     Extension 5 < 25 % WFL c end range pain lumbar.  Repeated x 5 in standing: continued end range pain lumbar in movement, no worse - no leg symptoms change  Right lateral flexion   Movement to lateral epicondyle  Left lateral flexion   Movement to lateral epicondyle   Right rotation     Left rotation      (Blank rows = not tested)   LE ROM:   Passive  Right 10/16/2021 Left 10/16/2021  Hip flexion 70 70  Hip extension      Hip abduction      Hip adduction      Hip internal rotation      Hip external rotation      Knee flexion      Knee extension      Ankle dorsiflexion      Ankle plantarflexion      Ankle inversion      Ankle eversion       (Blank rows  = not tested)   LE MMT:   Strength Right 10/16/2021 Left 10/16/2021  Hip flexion      Hip extension      Hip abduction      Hip adduction      Hip internal rotation      Hip external rotation      Knee flexion      Knee extension      Ankle dorsiflexion      Ankle plantarflexion      Ankle inversion      Ankle eversion       (Blank rows = not tested)   Special Testing lumbar: 10/23/2021:  + Slump testing on Lt  GAIT: 10/16/2021 Comments: [name] notes difficulty walking more than a few minutes.  She notes using a shopping cart helps her walking endurance.  This is a sign of postural weakness.       TODAY'S TREATMENT  10/23/2021:  Therex:   Review of existing HEP c cues for techniques to improve position   Supine lumbar trunk rotation 15 sec x 3 bilateral   Supine sciatic nerve flossing Lt x 5 c strap to hold hip into 90 deg flexion (pain in back in position noted)    Seated sciatic nerve flossing x 10 c cues for home use   Supine figure 4 press away stretch 20 sec x 2 Lt   Supine bridge x 10 c pain noted   Supine glute med 5 sec hold x 10    10/16/2021 Therapeutic Exercises:   Lumbar extension AROM (hips forward) 10X 3 seconds (comfortable range) Shoulder blade pinches 10X 5 seconds Modified Thomas stretch (1 leg off table) 5X 20 seconds B   Functional Activities: Review imaging with spine model, review log roll and golfer's lift, review exam findings and starter HEP.     PATIENT EDUCATION:  10/23/2021 Education details: HEP adjustment and progression Person educated: Patient Education method: Programmer, multimedia, Demonstration, Verbal cues, and Handouts Education comprehension: verbalized understanding, returned demonstration, verbal cues required, tactile cues required, and needs further education     HOME EXERCISE PROGRAM: Access Code: [email or web address] URL: [email or web address] Date: 10/23/2021 Prepared by: Chyrel Masson  Exercises - Standing Lumbar  Extension at Wall - Forearms  - 5 x daily - 7 x weekly - 1 sets - 5 reps - 3 seconds hold - Supine Lower Trunk Rotation  - 2 x daily - 7 x weekly - 1 sets -  5 reps - 15 seconds hold - Supine Figure 4 Piriformis Stretch  - 2 x daily - 7 x weekly - 1 sets - 4-5 reps - 20 seconds hold - Seated Table Hamstring Stretch  - 2 x daily - 7 x weekly - 1 sets - 4-5 reps - 20 seconds hold - Modified Thomas Stretch  - 2 x daily - 7 x weekly - 1 sets - 4-5 reps - 20 seconds hold - Standing Scapular Retraction  - 5 x daily - 7 x weekly - 1 sets - 5 reps - 5 second hold - Supine 90/90 Sciatic Nerve Glide with Knee Flexion/Extension (Mirrored)  - 2 x daily - 7 x weekly - 1-2 sets - 10 reps   ASSESSMENT:   CLINICAL IMPRESSION: Pt presented c low back pain c mobility restriction as well as leg numbness/pain complaints (not directly observed within clinic).  No specific centralization or peripheralization noted in performance of lumbar movements within clinic today.   Focused on review of HEP and adjustment of techniques to avoid symptom exacerbation.  Instructed Pt in use of OTC medicine as instructed by family MD or box label (prn).      OBJECTIVE IMPAIRMENTS Abnormal gait, decreased activity tolerance, decreased endurance, decreased knowledge of condition, decreased mobility, difficulty walking, decreased ROM, decreased strength, decreased safety awareness, impaired perceived functional ability, increased muscle spasms, impaired flexibility, improper body mechanics, postural dysfunction, obesity, and pain.    ACTIVITY LIMITATIONS community activity, driving, occupation, and prolonged postures .    PERSONAL FACTORS OA, HTN, previous L ankle fracture and L knee arthroscopy, obesity, chronic hepatitis C are also affecting patient's functional outcome.      REHAB POTENTIAL: Good   CLINICAL DECISION MAKING: Stable/uncomplicated   EVALUATION COMPLEXITY: Low     GOALS: Goals reviewed with patient? Yes   SHORT  TERM GOALS: Target date: 11/13/2021      Improve Lt leg pain to consistently 0-3/10 on the Numeric Pain Rating Scale.  Goal status: on going - assessed 10/23/2021   2.  Travis will be able to demonstrate correct sitting, standing posture and correct bed mobility and lifting mechanics.  Goal status: on going - assessed 10/23/2021   3.  Improve trunk extension AROM to 15 degrees.  Goal status: on going - assessed 10/23/2021   LONG TERM GOALS: Target date: 12/11/2021     Improve low back pain to consistently 0-3/10 on the Numeric Pain Rating Scale. Baseline: 4-6/10 Goal status: INITIAL   2.  Improve FOTO to 58. Baseline: 41 Goal status: INITIAL   3.  Improve B LE flexibility for hip flexors to 90 and hamstrings to 45 degrees. Baseline: 70 and 45 degrees, respectively Goal status: INITIAL   4.  Improve spine strength as assessed by FOTO and objective testing. Baseline: Objective testing deferred at evaluation due to difficulty assuming test postures. Goal status: INITIAL   5.  Konner will be independent with her long-term HEP at DC. Baseline: Started 10/16/2021 Goal status: INITIAL   PLAN: PT FREQUENCY: 1-2x/week   PT DURATION: 8 weeks   PLANNED INTERVENTIONS: Therapeutic exercises, Therapeutic activity, Neuromuscular re-education, Gait training, Patient/Family education, Joint mobilization, Dry Needling, Cryotherapy, Traction, and Manual therapy.   PLAN FOR NEXT SESSION:  Possible estim use as desired.  Progressive mobility improvements.   Chyrel Masson, PT, DPT, OCS, ATC 10/23/21  4:06 PM

## 2021-11-06 ENCOUNTER — Other Ambulatory Visit: Payer: Self-pay

## 2021-11-08 ENCOUNTER — Ambulatory Visit (INDEPENDENT_AMBULATORY_CARE_PROVIDER_SITE_OTHER): Payer: Commercial Managed Care - HMO | Admitting: Rehabilitative and Restorative Service Providers"

## 2021-11-08 ENCOUNTER — Encounter: Payer: Self-pay | Admitting: Rehabilitative and Restorative Service Providers"

## 2021-11-08 DIAGNOSIS — R262 Difficulty in walking, not elsewhere classified: Secondary | ICD-10-CM | POA: Diagnosis not present

## 2021-11-08 DIAGNOSIS — R293 Abnormal posture: Secondary | ICD-10-CM

## 2021-11-08 DIAGNOSIS — M5416 Radiculopathy, lumbar region: Secondary | ICD-10-CM | POA: Diagnosis not present

## 2021-11-08 DIAGNOSIS — M5459 Other low back pain: Secondary | ICD-10-CM | POA: Diagnosis not present

## 2021-11-08 DIAGNOSIS — M6281 Muscle weakness (generalized): Secondary | ICD-10-CM

## 2021-11-08 NOTE — Therapy (Signed)
OUTPATIENT PHYSICAL THERAPY TREATMENT NOTE   Patient Name: Sally Reid MRN: [id number] DOB:Jan 06, 1961, 61 y.o., female Today's Date: 11/08/2021  END OF SESSION:   PT End of Session - 11/08/21 1257     Visit Number 3    Number of Visits 12    PT Start Time 1257    PT Stop Time 1344    PT Time Calculation (min) 47 min    Activity Tolerance Patient tolerated treatment well    Behavior During Therapy WFL for tasks assessed/performed              Past Medical History:  Diagnosis Date   Arthritis    Hypertension    Past Surgical History:  Procedure Laterality Date   ABDOMINAL HYSTERECTOMY     2005   ANKLE FRACTURE SURGERY Left    arthroscopy of left knee  2008   for cartilage surgery   CESAREAN SECTION     2 previous   LEG SURGERY     Patient Active Problem List   Diagnosis Date Noted   Chronic hepatitis C without hepatic coma (HCC) 04/21/2021   Left leg pain 04/18/2021   History of ankle fracture 04/18/2021   Tobacco use 04/18/2021   Essential hypertension 03/23/2020   Fatty liver 03/23/2020   Morbid obesity with BMI of 45.0-49.9, adult (HCC) 03/23/2020   Multiple thyroid nodules 07/14/2018    PCP: Storm Frisk, MD   REFERRING PROVIDER: Nadara Mustard, MD   REFERRING DIAG: M51.16 (ICD-10-CM) - Intervertebral disc disorders with radiculopathy, lumbar region   ONSET DATE: About 2 months with sciatica, chronic LBP.  THERAPY DIAG:  Abnormal posture  Difficulty in walking, not elsewhere classified  Radiculopathy, lumbar region  Other low back pain  Muscle weakness (generalized)  Rationale for Evaluation and Treatment Rehabilitation  PERTINENT HISTORY: OA, HTN, previous L ankle fracture and L knee arthroscopy, obesity, chronic hepatitis C  PRECAUTIONS: No surgery protocols  SUBJECTIVE: Sally Reid notes symptoms are moving proximal.  L leg symptoms are as peripheral as the knee (was ankle).  Pain is worst with fatigue late in the day and at the end  of her 8 hour work day.  PAIN:  NPRS scale: 3/10 at best and 9/10 at worst this week (late in the day) Pain location: Low back and L leg to the knee (was ankle) Pain description: Ache, tingling leg and stabbing for the back Aggravating factors: Nighttime, at the end of the workday Relieving factors: OTC pain medication, sitting at night vs. Lying down at times.    OBJECTIVE:    DIAGNOSTIC FINDINGS:   10/16/2021 IE : 2 view radiographs of the lumbar spine shows no scoliosis no pars defect.    There is facet arthropathy and degenerative disc disease.    PATIENT SURVEYS:  10/16/2021 FOTO 41 (Goal 58 in 11 visits)   SCREENING FOR RED FLAGS: 10/16/2021 Bowel or bladder incontinence: No Spinal tumors: No Cauda equina syndrome: No Compression fracture: No Abdominal aneurysm: No   COGNITION: 10/16/2021 Overall cognitive status: Within functional limits for tasks assessed                          SENSATION: 10/16/2021 Notes tingling but no numbness.  She does note some Lt UE tingling as well.   MUSCLE LENGTH: 10/16/2021 Hamstrings: Right 25 deg; Left 25 deg   POSTURE:  10/16/2021 Mild flexion at the hips, IR and protracted shoulders and mild forward head.   LUMBAR  ROM:    Active  A/PROM  10/16/2021 AROM 10/23/2021  Flexion     Extension 5 < 25 % WFL c end range pain lumbar.  Repeated x 5 in standing: continued end range pain lumbar in movement, no worse - no leg symptoms change  Right lateral flexion   Movement to lateral epicondyle  Left lateral flexion   Movement to lateral epicondyle   Right rotation     Left rotation      (Blank rows = not tested)   LE ROM:   Passive  Right 10/16/2021 Left 10/16/2021  Hip flexion 70 70  Hip extension      Hip abduction      Hip adduction      Hip internal rotation      Hip external rotation      Knee flexion      Knee extension      Ankle dorsiflexion      Ankle plantarflexion      Ankle inversion      Ankle eversion        (Blank rows = not tested)   LE MMT:   Strength Right 10/16/2021 Left 10/16/2021  Hip flexion      Hip extension      Hip abduction      Hip adduction      Hip internal rotation      Hip external rotation      Knee flexion      Knee extension      Ankle dorsiflexion      Ankle plantarflexion      Ankle inversion      Ankle eversion       (Blank rows = not tested)   Special Testing lumbar: 10/23/2021:  + Slump testing on Lt  GAIT: 10/16/2021 Comments: Sally Reid notes difficulty walking more than a few minutes.  She notes using a shopping cart helps her walking endurance.  This is a sign of postural weakness.       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   10/23/2021:  Therex:   Review of existing HEP c cues for techniques to improve position   Supine lumbar trunk rotation 15 sec x 3 bilateral   Supine sciatic nerve flossing Lt x 5 c strap to hold hip into 90 deg flexion (pain in back in position noted)    Seated sciatic nerve flossing x 10 c cues for home use   Supine figure 4 press away stretch 20 sec x 2 Lt   Supine bridge x 10 c pain noted   Supine glute med 5 sec hold x 10     10/16/2021 Therapeutic Exercises:   Lumbar extension AROM (hips forward) 10X 3 seconds (comfortable range) Shoulder blade pinches 10X 5 seconds Modified Thomas stretch (1 leg off table) 5X 20 seconds B   Functional Activities: Review imaging with spine model, review log roll and golfer's lift, review exam  findings and starter HEP.     PATIENT EDUCATION:  10/23/2021 Education details: HEP adjustment and progression Person educated: Patient Education method: Solicitor, Verbal cues, and Handouts Education comprehension: verbalized understanding, returned demonstration, verbal cues required, tactile cues required, and needs further education     HOME EXERCISE PROGRAM: Access Code: [email or web address] URL: [email or web address] Date: 11/08/2021 Prepared by: Pauletta Browns  Exercises - Standing Lumbar Extension at Wall - Forearms  - 5 x daily - 7 x weekly - 1 sets - 5 reps - 3 seconds hold - Modified Thomas Stretch  - 1-2 x daily - 7 x weekly - 1 sets - 4-5 reps - 20 seconds hold - Standing Scapular Retraction  - 5 x daily - 7 x weekly - 1 sets - 5 reps - 5 second hold - Heel Toe Raises with Counter Support  - 2-3 x daily - 7 x weekly - 1 sets - 10 reps - 3 seconds hold   ASSESSMENT:   CLINICAL IMPRESSION: Tyesha is noting improvements in how far down her leg her peripheral symptoms go.  She can still get severe pain, particularly with fatigue and late in the day.  This is consistent with postural and muscular weaknesses noted today.  She will benefit from continued strength and functional work to meet LTGs.   OBJECTIVE IMPAIRMENTS Abnormal gait, decreased activity tolerance, decreased endurance, decreased knowledge of condition, decreased mobility, difficulty walking, decreased ROM, decreased strength, decreased safety awareness, impaired perceived functional ability, increased muscle spasms, impaired flexibility, improper body mechanics, postural dysfunction, obesity, and pain.    ACTIVITY LIMITATIONS community activity, driving, occupation, and prolonged postures .    PERSONAL FACTORS OA, HTN, previous L ankle fracture and L knee arthroscopy, obesity, chronic hepatitis C are also affecting patient's functional outcome.      REHAB POTENTIAL: Good   CLINICAL  DECISION MAKING: Stable/uncomplicated   EVALUATION COMPLEXITY: Low     GOALS: Goals reviewed with patient? Yes   SHORT TERM GOALS: Target date: 11/13/2021      Improve Lt leg pain to consistently 0-3/10 on the Numeric Pain Rating Scale.  Goal status: On going - assessed 11/08/2021   2.  Nicholl will be able to demonstrate correct sitting, standing posture and correct bed mobility and lifting mechanics.  Goal status: On going - assessed 11/08/2021   3.  Improve trunk extension AROM to 15 degrees.  Goal status: On going - assessed 10/23/2021   LONG TERM GOALS: Target date: 12/11/2021     Improve low back pain to consistently 0-3/10 on the Numeric Pain Rating Scale. Baseline: 4-6/10 Goal status: INITIAL   2.  Improve FOTO to 58. Baseline: 41 Goal status: INITIAL   3.  Improve B LE flexibility for hip flexors to 90 and hamstrings to 45 degrees. Baseline: 70 and 45 degrees, respectively Goal status: INITIAL   4.  Improve spine strength as assessed by FOTO and objective testing. Baseline: Objective testing deferred at evaluation due to difficulty assuming test postures. Goal status: INITIAL   5.  Cadie will be independent with her long-term HEP at DC. Baseline: Started 10/16/2021 Goal status: INITIAL   PLAN: PT FREQUENCY: 1-2x/week   PT DURATION: 8 weeks   PLANNED INTERVENTIONS: Therapeutic exercises, Therapeutic activity, Neuromuscular re-education, Gait training, Patient/Family education, Joint mobilization, Dry Needling, Cryotherapy, Traction, and Manual therapy.   PLAN FOR NEXT SESSION:  Review early HEP.  Low back strength progressions like bridging, rows with theraband, sit to stand (for mechanics).  Posture and body mechanics work as appropriate.  Cherlyn Cushing PT, MPT 11/08/21  3:28 PM

## 2021-11-09 ENCOUNTER — Other Ambulatory Visit: Payer: Self-pay

## 2021-11-15 ENCOUNTER — Other Ambulatory Visit: Payer: Self-pay

## 2021-11-15 ENCOUNTER — Ambulatory Visit (INDEPENDENT_AMBULATORY_CARE_PROVIDER_SITE_OTHER): Payer: Commercial Managed Care - HMO | Admitting: Physical Therapy

## 2021-11-15 ENCOUNTER — Other Ambulatory Visit: Payer: Commercial Managed Care - HMO

## 2021-11-15 ENCOUNTER — Encounter: Payer: Self-pay | Admitting: Physical Therapy

## 2021-11-15 DIAGNOSIS — M5459 Other low back pain: Secondary | ICD-10-CM | POA: Diagnosis not present

## 2021-11-15 DIAGNOSIS — R293 Abnormal posture: Secondary | ICD-10-CM

## 2021-11-15 DIAGNOSIS — B182 Chronic viral hepatitis C: Secondary | ICD-10-CM

## 2021-11-15 DIAGNOSIS — M5416 Radiculopathy, lumbar region: Secondary | ICD-10-CM | POA: Diagnosis not present

## 2021-11-15 DIAGNOSIS — R262 Difficulty in walking, not elsewhere classified: Secondary | ICD-10-CM | POA: Diagnosis not present

## 2021-11-15 DIAGNOSIS — M6281 Muscle weakness (generalized): Secondary | ICD-10-CM

## 2021-11-15 NOTE — Therapy (Signed)
OUTPATIENT PHYSICAL THERAPY TREATMENT NOTE   Patient Name: Sally BasquesMichelle Reid MRN: [id number] DOB:02/14/1961, 61 y.o., female Today's Date: 11/15/2021  END OF SESSION:   PT End of Session - 11/15/21 1259     Visit Number 4    Number of Visits 12    PT Start Time 1300    PT Stop Time 1330    PT Time Calculation (min) 30 min    Activity Tolerance Patient tolerated treatment well    Behavior During Therapy WFL for tasks assessed/performed              Past Medical History:  Diagnosis Date   Arthritis    Hypertension    Past Surgical History:  Procedure Laterality Date   ABDOMINAL HYSTERECTOMY     2005   ANKLE FRACTURE SURGERY Left    arthroscopy of left knee  2008   for cartilage surgery   CESAREAN SECTION     2 previous   LEG SURGERY     Patient Active Problem List   Diagnosis Date Noted   Chronic hepatitis C without hepatic coma (HCC) 04/21/2021   Left leg pain 04/18/2021   History of ankle fracture 04/18/2021   Tobacco use 04/18/2021   Essential hypertension 03/23/2020   Fatty liver 03/23/2020   Morbid obesity with BMI of 45.0-49.9, adult (HCC) 03/23/2020   Multiple thyroid nodules 07/14/2018    PCP: Storm FriskPatrick E Wright, MD   REFERRING PROVIDER: Nadara MustardMarcus V Duda, MD   REFERRING DIAG: M51.16 (ICD-10-CM) - Intervertebral disc disorders with radiculopathy, lumbar region   ONSET DATE: About 2 months with sciatica, chronic LBP.  THERAPY DIAG:  Abnormal posture  Difficulty in walking, not elsewhere classified  Radiculopathy, lumbar region  Other low back pain  Muscle weakness (generalized)  Rationale for Evaluation and Treatment Rehabilitation  PERTINENT HISTORY: OA, HTN, previous L ankle fracture and L knee arthroscopy, obesity, chronic hepatitis C  PRECAUTIONS: No surgery protocols  SUBJECTIVE: She says she is not having pain down her legs today. The pain in the legs are mostly at night  PAIN:  NPRS scale: 5/10 Pain location: Low back  Pain  description: Ache, tingling leg and stabbing for the back Aggravating factors: Nighttime, at the end of the workday Relieving factors: OTC pain medication, sitting at night vs. Lying down at times.    OBJECTIVE:    DIAGNOSTIC FINDINGS:   10/16/2021 IE : 2 view radiographs of the lumbar spine shows no scoliosis no pars defect.    There is facet arthropathy and degenerative disc disease.    PATIENT SURVEYS:  10/16/2021 FOTO 41 (Goal 58 in 11 visits)   SCREENING FOR RED FLAGS: 10/16/2021 Bowel or bladder incontinence: No Spinal tumors: No Cauda equina syndrome: No Compression fracture: No Abdominal aneurysm: No   COGNITION: 10/16/2021 Overall cognitive status: Within functional limits for tasks assessed                          SENSATION: 10/16/2021 Notes tingling but no numbness.  She does note some Lt UE tingling as well.   MUSCLE LENGTH: 10/16/2021 Hamstrings: Right 25 deg; Left 25 deg   POSTURE:  10/16/2021 Mild flexion at the hips, IR and protracted shoulders and mild forward head.   LUMBAR ROM:    Active  A/PROM  10/16/2021 AROM 10/23/2021  Flexion     Extension 5 < 25 % WFL c end range pain lumbar.  Repeated x 5 in standing: continued end range  pain lumbar in movement, no worse - no leg symptoms change  Right lateral flexion   Movement to lateral epicondyle  Left lateral flexion   Movement to lateral epicondyle   Right rotation     Left rotation      (Blank rows = not tested)   LE ROM:   Passive  Right 10/16/2021 Left 10/16/2021  Hip flexion 70 70  Hip extension      Hip abduction      Hip adduction      Hip internal rotation      Hip external rotation      Knee flexion      Knee extension      Ankle dorsiflexion      Ankle plantarflexion      Ankle inversion      Ankle eversion       (Blank rows = not tested)   LE MMT:   Strength Right 10/16/2021 Left 10/16/2021  Hip flexion      Hip extension      Hip abduction      Hip adduction      Hip  internal rotation      Hip external rotation      Knee flexion      Knee extension      Ankle dorsiflexion      Ankle plantarflexion      Ankle inversion      Ankle eversion       (Blank rows = not tested)   Special Testing lumbar: 10/23/2021:  + Slump testing on Lt  GAIT: 10/16/2021 Comments: [name] notes difficulty walking more than a few minutes.  She notes using a shopping cart helps her walking endurance.  This is a sign of postural weakness.       TODAY'S TREATMENT  11/15/21  Recumbent bike L1 X 5 min (needed 3 rest breaks to finish)  Standing rows  red X 15 bilat  Standing extensions red  X15 bilat  Supine bridges X15  Supine figure 4 press away stretch 20 sec x 3 bilat  Supine clams green X 20   Supine ab set with shoulder extension into red ball 5 sec X 10,  supine ab set isometric unilat hip flexion 5 sec X 10 ea side  Hip hike 2 sets of 5 bilat with UE support Standing lumbar extensions 3 sec hold X 10  11/08/2021: Lumbar extension AROM (hips forward) 10X 3 seconds (comfortable range) Shoulder blade pinches 10X 5 seconds Modified Thomas stretch (1 leg off table) 5X 20 seconds B Heel to toe raises with transversus abdominus contraction 10X 3 seconds Hip hike in door way 5X 3 seconds B   Functional Activities: Review anatomy with spine model and relate this to her imaging, review log roll for bed mobility, 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   10/23/2021:  Therex:   Review of existing HEP c cues for techniques to improve position   Supine lumbar trunk rotation 15 sec x 3 bilateral   Supine sciatic nerve flossing Lt x 5 c strap to hold hip into 90 deg flexion (pain in back in position noted)    Seated sciatic nerve flossing x 10 c cues for home use   Supine figure 4 press away stretch 20 sec x 2 Lt   Supine bridge x 10 c pain noted   Supine glute med 5 sec hold x  10  10/16/2021 Therapeutic Exercises:   Lumbar extension AROM (hips forward) 10X 3 seconds (comfortable range) Shoulder blade pinches 10X 5 seconds Modified Thomas stretch (1 leg off table) 5X 20 seconds B   Functional Activities: Review imaging with spine model, review log roll and golfer's lift, review exam findings and starter HEP.     PATIENT EDUCATION:  10/23/2021 Education details: HEP adjustment and progression Person educated: Patient Education method: Programmer, multimedia, Demonstration, Verbal cues, and Handouts Education comprehension: verbalized understanding, returned demonstration, verbal cues required, tactile cues required, and needs further education     HOME EXERCISE PROGRAM: Access Code: [email or web address] URL: [email or web address] Date: 11/08/2021 Prepared by: Pauletta Browns  Exercises - Standing Lumbar Extension at Wall - Forearms  - 5 x daily - 7 x weekly - 1 sets - 5 reps - 3 seconds hold - Modified Thomas Stretch  - 1-2 x daily - 7 x weekly - 1 sets - 4-5 reps - 20 seconds hold - Standing Scapular Retraction  - 5 x daily - 7 x weekly - 1 sets - 5 reps - 5 second hold - Heel Toe Raises with Counter Support  - 2-3 x daily - 7 x weekly - 1 sets - 10 reps - 3 seconds hold   ASSESSMENT:   CLINICAL IMPRESSION: She was not having radicular symptoms today upon arrival so I progressed her strength program gradually to her tolerance level. She gets fatigued quickly and still has associated weakness that will continue to need to be addressed with PT.   OBJECTIVE IMPAIRMENTS Abnormal gait, decreased activity tolerance, decreased endurance, decreased knowledge of condition, decreased mobility, difficulty walking, decreased ROM, decreased strength, decreased safety awareness, impaired perceived functional ability, increased muscle spasms, impaired flexibility, improper body mechanics, postural dysfunction, obesity, and pain.    ACTIVITY LIMITATIONS community activity,  driving, occupation, and prolonged postures .    PERSONAL FACTORS OA, HTN, previous L ankle fracture and L knee arthroscopy, obesity, chronic hepatitis C are also affecting patient's functional outcome.      REHAB POTENTIAL: Good   CLINICAL DECISION MAKING: Stable/uncomplicated   EVALUATION COMPLEXITY: Low     GOALS: Goals reviewed with patient? Yes   SHORT TERM GOALS: Target date: 11/13/2021      Improve Lt leg pain to consistently 0-3/10 on the Numeric Pain Rating Scale.  Goal status: On going - assessed 11/05/2021   2.  Kamyiah will be able to demonstrate correct sitting, standing posture and correct bed mobility and lifting mechanics.  Goal status: On going - assessed 11/08/2021   3.  Improve trunk extension AROM to 15 degrees.  Goal status: On going - assessed 10/23/2021   LONG TERM GOALS: Target date: 12/11/2021     Improve low back pain to consistently 0-3/10 on the Numeric Pain Rating Scale. Baseline: 4-6/10 Goal status: INITIAL   2.  Improve FOTO to 58. Baseline: 41 Goal status: INITIAL   3.  Improve B LE flexibility for hip flexors to 90 and hamstrings to 45 degrees. Baseline: 70 and 45 degrees, respectively Goal status: INITIAL   4.  Improve spine strength as assessed by FOTO and objective testing. Baseline: Objective testing deferred at evaluation due to difficulty assuming test postures. Goal status: INITIAL   5.  Aaralyn will be independent with her long-term HEP at DC. Baseline: Started 10/16/2021 Goal status: INITIAL   PLAN: PT FREQUENCY: 1-2x/week   PT DURATION: 8 weeks   PLANNED INTERVENTIONS: Therapeutic exercises, Therapeutic activity, Neuromuscular re-education, Gait training, Patient/Family education, Joint mobilization, Dry  Needling, Cryotherapy, Traction, and Manual therapy.   PLAN FOR NEXT SESSION:  gradual strength progressions as able. Posture and body mechanics work as appropriate.  Ivery Quale, PT, DPT 11/15/21 1:34 PM

## 2021-11-19 LAB — COMPREHENSIVE METABOLIC PANEL
AG Ratio: 1.1 (calc) (ref 1.0–2.5)
ALT: 32 U/L — ABNORMAL HIGH (ref 6–29)
AST: 36 U/L — ABNORMAL HIGH (ref 10–35)
Albumin: 3.6 g/dL (ref 3.6–5.1)
Alkaline phosphatase (APISO): 58 U/L (ref 37–153)
BUN: 15 mg/dL (ref 7–25)
CO2: 32 mmol/L (ref 20–32)
Calcium: 9.5 mg/dL (ref 8.6–10.4)
Chloride: 104 mmol/L (ref 98–110)
Creat: 0.79 mg/dL (ref 0.50–1.05)
Globulin: 3.2 g/dL (calc) (ref 1.9–3.7)
Glucose, Bld: 88 mg/dL (ref 65–99)
Potassium: 3.7 mmol/L (ref 3.5–5.3)
Sodium: 141 mmol/L (ref 135–146)
Total Bilirubin: 0.4 mg/dL (ref 0.2–1.2)
Total Protein: 6.8 g/dL (ref 6.1–8.1)

## 2021-11-19 LAB — CBC
HCT: 40.9 % (ref 35.0–45.0)
Hemoglobin: 14.1 g/dL (ref 11.7–15.5)
MCH: 29.7 pg (ref 27.0–33.0)
MCHC: 34.5 g/dL (ref 32.0–36.0)
MCV: 86.1 fL (ref 80.0–100.0)
MPV: 11 fL (ref 7.5–12.5)
Platelets: 273 10*3/uL (ref 140–400)
RBC: 4.75 10*6/uL (ref 3.80–5.10)
RDW: 13.1 % (ref 11.0–15.0)
WBC: 6.9 10*3/uL (ref 3.8–10.8)

## 2021-11-19 LAB — HEPATITIS C RNA QUANTITATIVE
HCV Quantitative Log: 6.26 log IU/mL — ABNORMAL HIGH
HCV RNA, PCR, QN: 1820000 IU/mL — ABNORMAL HIGH

## 2021-11-20 ENCOUNTER — Other Ambulatory Visit (HOSPITAL_COMMUNITY): Payer: Self-pay

## 2021-11-20 ENCOUNTER — Telehealth: Payer: Self-pay

## 2021-11-20 NOTE — Telephone Encounter (Signed)
-----   Message from Raymondo Band, MD sent at 11/20/2021  3:08 PM EDT ----- [facility] Was this patient ever on any hep c meds. It looks like I saw her 06/2021 and planned to treat with repeat hcv viral load recently still high  I couldn't see [place] pharmacy note afterward or medication list showing any DAA  Could we make her an appointment with me within the next few weeks, but if she hasn't gotten treated, could we get her on meds and I can see her after treatment  thanks

## 2021-11-20 NOTE — Progress Notes (Signed)
Looks like she was not started on treatment. Let us know what treatment you would prefer and we can look into it. Thanks, Marchelle Folks

## 2021-11-20 NOTE — Progress Notes (Signed)
Pt do have insurance so medication will need a PA

## 2021-11-21 ENCOUNTER — Other Ambulatory Visit (HOSPITAL_COMMUNITY): Payer: Self-pay

## 2021-11-21 ENCOUNTER — Telehealth: Payer: Self-pay

## 2021-11-21 NOTE — Progress Notes (Signed)
I will start a PA , would you be able to tell me the FIB score?

## 2021-11-21 NOTE — Telephone Encounter (Signed)
[facility] Patient Advocate Encounter   Received notification from [employer] that prior authorization for Sally Reid is required.   PA submitted on 11/21/21  Key [id number] Status is pending    [facility] will continue to follow.   Clearance Coots, CPhT [facility] Patient [facility] Phone: [phone] Fax:  [phone]

## 2021-11-22 ENCOUNTER — Ambulatory Visit (INDEPENDENT_AMBULATORY_CARE_PROVIDER_SITE_OTHER): Payer: Commercial Managed Care - HMO | Admitting: Rehabilitative and Restorative Service Providers"

## 2021-11-22 ENCOUNTER — Encounter: Payer: Self-pay | Admitting: Rehabilitative and Restorative Service Providers"

## 2021-11-22 DIAGNOSIS — M6281 Muscle weakness (generalized): Secondary | ICD-10-CM

## 2021-11-22 DIAGNOSIS — M5459 Other low back pain: Secondary | ICD-10-CM | POA: Diagnosis not present

## 2021-11-22 DIAGNOSIS — M5416 Radiculopathy, lumbar region: Secondary | ICD-10-CM | POA: Diagnosis not present

## 2021-11-22 DIAGNOSIS — R262 Difficulty in walking, not elsewhere classified: Secondary | ICD-10-CM

## 2021-11-22 DIAGNOSIS — R293 Abnormal posture: Secondary | ICD-10-CM | POA: Diagnosis not present

## 2021-11-22 NOTE — Therapy (Signed)
OUTPATIENT PHYSICAL THERAPY TREATMENT NOTE   Patient Name: Sally Reid MRN: [id number] DOB:06-10-1960, 61 y.o., female Today's Date: 11/22/2021  END OF SESSION:   PT End of Session - 11/22/21 1255     Visit Number 5    Number of Visits 12    Date for PT Re-Evaluation 12/11/21   per goals   Authorization Type [employer] HMO - 30 visit limit PT /OT Burna Mortimer    PT Start Time 1300    PT Stop Time 1340    PT Time Calculation (min) 40 min    Activity Tolerance Patient limited by pain    Behavior During Therapy [facility] for tasks assessed/performed               Past Medical History:  Diagnosis Date   Arthritis    Hypertension    Past Surgical History:  Procedure Laterality Date   ABDOMINAL HYSTERECTOMY     2005   ANKLE FRACTURE SURGERY Left    arthroscopy of left knee  2008   for cartilage surgery   CESAREAN SECTION     2 previous   LEG SURGERY     Patient Active Problem List   Diagnosis Date Noted   Chronic hepatitis C without hepatic coma ([place]) 04/21/2021   Left leg pain 04/18/2021   History of ankle fracture 04/18/2021   Tobacco use 04/18/2021   Essential hypertension 03/23/2020   Fatty liver 03/23/2020   Morbid obesity with BMI of 45.0-49.9, adult ([place]) 03/23/2020   Multiple thyroid nodules 07/14/2018    PCP: Elsie Stain, MD   REFERRING PROVIDER: Newt Minion, MD   REFERRING DIAG: M51.16 (ICD-10-CM) - Intervertebral disc disorders with radiculopathy, lumbar region   ONSET DATE: About 2 months with sciatica, chronic LBP.  THERAPY DIAG:  Abnormal posture  Difficulty in walking, not elsewhere classified  Radiculopathy, lumbar region  Other low back pain  Muscle weakness (generalized)  Rationale for Evaluation and Treatment Rehabilitation  PERTINENT HISTORY: OA, HTN, previous L ankle fracture and L knee arthroscopy, obesity, chronic hepatitis C  PRECAUTIONS: No surgery protocols  SUBJECTIVE: Pt indicated having pain in back, reporting since 9 am  this morning.  Noticed primarily in walking more with patient.   Pt indicated the rest of the week since last visit has had pain but not as much as today (5-6/10).  Pt indicated Lt leg still hurts mainly at night (not distal to knee).   PAIN:  NPRS scale: 8-9/10 Pain location: Low back  Pain description: Ache, tingling leg and stabbing for the back Aggravating factors: walking more today Relieving factors: OTC pain medication, sitting at night vs. Lying down at times.    OBJECTIVE:    DIAGNOSTIC FINDINGS:   10/16/2021 IE : 2 view radiographs of the lumbar spine shows no scoliosis no pars defect.    There is facet arthropathy and degenerative disc disease.    PATIENT SURVEYS:  10/16/2021 FOTO 41 (Goal 58 in 11 visits)   SCREENING FOR RED FLAGS: 10/16/2021 Bowel or bladder incontinence: No Spinal tumors: No Cauda equina syndrome: No Compression fracture: No Abdominal aneurysm: No   COGNITION: 10/16/2021 Overall cognitive status: Within functional limits for tasks assessed                          SENSATION: 10/16/2021 Notes tingling but no numbness.  She does note some Lt UE tingling as well.   MUSCLE LENGTH: 10/16/2021 Hamstrings: Right 25 deg; Left 25  deg   POSTURE:  10/16/2021 Mild flexion at the hips, IR and protracted shoulders and mild forward head.   LUMBAR ROM:    Active  A/PROM  10/16/2021 AROM 10/23/2021 AROM 11/22/2021  Flexion      Extension 5 < 25 % WFL c end range pain lumbar.  Repeated x 5 in standing: continued end range pain lumbar in movement, no worse - no leg symptoms change Movement to neutral c lumbar pain produced (no observed Lt leg symptoms noted).  5 x in standing improved to 25% c end range pain noted.   Right lateral flexion   Movement to lateral epicondyle   Left lateral flexion   Movement to lateral epicondyle    Right rotation      Left rotation       (Blank rows = not tested)   LE ROM:   Passive  Right 10/16/2021 Left 10/16/2021   Hip flexion 70 70  Hip extension      Hip abduction      Hip adduction      Hip internal rotation      Hip external rotation      Knee flexion      Knee extension      Ankle dorsiflexion      Ankle plantarflexion      Ankle inversion      Ankle eversion       (Blank rows = not tested)   LE MMT:   Strength Right 10/16/2021 Left 10/16/2021  Hip flexion      Hip extension      Hip abduction      Hip adduction      Hip internal rotation      Hip external rotation      Knee flexion      Knee extension      Ankle dorsiflexion      Ankle plantarflexion      Ankle inversion      Ankle eversion       (Blank rows = not tested)   Special Testing lumbar: 10/23/2021:  + Slump testing on Lt  GAIT: 10/16/2021 Comments: Sally Reid notes difficulty walking more than a few minutes.  She notes using a shopping cart helps her walking endurance.  This is a sign of postural weakness.       TODAY'S TREATMENT  11/22/21  UBE LE only Lvl 3.0 6 mins  Standing lateral shift correction (wall at right) 2-3 sec hold x 10 (no change in presentation, no LE symptoms produced)  Standing lumbar extension x 10 (end range pain noted, no LE symptoms)  Supine bridge 2-3 sec hold (x8)- stopped due to pain symptoms.   Rt sidelying Lt hip clam shell x 10 (added to home to help improve strength and hip hike ability)    Manual:  percussive device to Lt glute med, Lt and Rt lumbar paraspinals.   11/15/21  Recumbent bike L1 X 5 min (needed 3 rest breaks to finish)  Standing rows  red X 15 bilat  Standing extensions red  X15 bilat  Supine bridges X15  Supine figure 4 press away stretch 20 sec x 3 bilat  Supine clams green X 20   Supine ab set with shoulder extension into red ball 5 sec X 10,  supine ab set isometric unilat hip flexion 5 sec X 10 ea side  Hip hike 2 sets of 5 bilat with UE support Standing lumbar extensions 3 sec hold X 10  11/08/2021:  Lumbar extension AROM (hips forward) 10X 3 seconds  (comfortable range) Shoulder blade pinches 10X 5 seconds Modified Thomas stretch (1 leg off table) 5X 20 seconds B Heel to toe raises with transversus abdominus contraction 10X 3 seconds Hip hike in door way 5X 3 seconds B   Functional Activities: Review anatomy with spine model and relate this to her imaging, review log roll for bed mobility, 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   PATIENT EDUCATION:  11/22/2021 Education details: HEP progression.  Lateral hip trigger point presentation education  Person educated: Patient Education method: Explanation, Demonstration, Verbal cues, and Handouts Education comprehension: verbalized understanding, returned demonstration, verbal cues required, tactile cues required, and needs further education     HOME EXERCISE PROGRAM: Access Code: [email or web address] URL: [email or web address] Date: 11/22/2021 Prepared by: Scot Jun  Exercises - Standing Lumbar Extension at [place]  - 5 x daily - 7 x weekly - 1 sets - 5 reps - 3 seconds hold - Supine Lower Trunk Rotation  - 1-2 x daily - 1-7 x weekly - 1 sets - 5 reps - 10 seconds hold - Supine Figure 4 Piriformis Stretch  - 1-2 x daily - 7 x weekly - 1 sets - 4-5 reps - 20 seconds hold - Seated Table Hamstring Stretch  - 1-2 x daily - 7 x weekly - 1 sets - 4-5 reps - 20 seconds hold - Modified Thomas Stretch  - 1-2 x daily - 7 x weekly - 1 sets - 4-5 reps - 20 seconds hold - Standing Scapular Retraction  - 5 x daily - 7 x weekly - 1 sets - 5 reps - 5 second hold - Heel Toe Raises with Counter Support  - 2-3 x daily - 7 x weekly - 1 sets - 10 reps - 3 seconds hold - Supine 90/90 Sciatic Nerve Glide with Knee Flexion/Extension  - 1-2 x daily - 7 x weekly - 1-2 sets - 10 reps - Clamshell  - 1 x daily - 7 x weekly - 3 sets - 10 reps   ASSESSMENT:   CLINICAL IMPRESSION: Arrival today with aggravating symptoms in back from  early today activity (walking primarily).  Focus today primarily on immediate pain reductions at rest and with lumbar mobility for improved symptoms compared to arrival.  Continued presentation of moderate to severe lumbar pain c mobility deficits noted while in clinic today c no reproduction of Lt leg symptoms with lumbar movement.   Today, Lt hip trigger points and myofascial restriction was noted c some concordant lateral leg symptoms that may be beneficial to work on to improve leg symptoms reported at night.    OBJECTIVE IMPAIRMENTS Abnormal gait, decreased activity tolerance, decreased endurance, decreased knowledge of condition, decreased mobility, difficulty walking, decreased ROM, decreased strength, decreased safety awareness, impaired perceived functional ability, increased muscle spasms, impaired flexibility, improper body mechanics, postural dysfunction, obesity, and pain.    ACTIVITY LIMITATIONS community activity, driving, occupation, and prolonged postures .    PERSONAL FACTORS OA, HTN, previous L ankle fracture and L knee arthroscopy, obesity, chronic hepatitis C are also affecting patient's functional outcome.      REHAB POTENTIAL: Good   CLINICAL DECISION MAKING: Stable/uncomplicated   EVALUATION COMPLEXITY: Low     GOALS: Goals reviewed with patient? Yes   SHORT TERM GOALS: Target date: 11/13/2021      Improve Lt leg pain to consistently 0-3/10 on the Numeric Pain  Rating Scale.  Goal status: partially met- assessed 11/22/2021   2.  Sally Reid will be able to demonstrate correct sitting, standing posture and correct bed mobility and lifting mechanics.  Goal status: partially met- assessed 11/22/2021   3.  Improve trunk extension AROM to 15 degrees.  Goal status: partially met- assessed 11/22/2021   LONG TERM GOALS: Target date: 12/11/2021     Improve low back pain to consistently 0-3/10 on the Numeric Pain Rating Scale. Baseline: 4-6/10 Goal status: on going -  assessed 11/22/2021   2.  Improve FOTO to 58. Baseline: 41 Goal status: on going - assessed 11/22/2021   3.  Improve bilateral LE flexibility for hip flexors to 90 and hamstrings to 45 degrees. Baseline: 70 and 45 degrees, respectively Goal status: on going - assessed 11/22/2021   4.  Improve spine strength as assessed by FOTO and objective testing. Baseline: Objective testing deferred at evaluation due to difficulty assuming test postures. Goal status: on going - assessed 11/22/2021   5.  Sally Reid will be independent with her long-term HEP at [place]. Baseline: Started 10/16/2021 Goal status: on going - assessed 11/22/2021   PLAN: PT FREQUENCY: 1-2x/week   PT DURATION: 8 weeks   PLANNED INTERVENTIONS: Therapeutic exercises, Therapeutic activity, Neuromuscular re-education, Gait training, Patient/Family education, Joint mobilization, Dry Needling, Cryotherapy, Traction, and Manual therapy.   PLAN FOR NEXT SESSION:  Check response from lateral hip trigger point release.  FOTO reassessment and recheck for goals/ continued care plan?  Scot Jun, PT, DPT, OCS, ATC 11/22/21  1:48 PM

## 2021-11-24 ENCOUNTER — Other Ambulatory Visit (HOSPITAL_COMMUNITY): Payer: Self-pay

## 2021-11-27 ENCOUNTER — Other Ambulatory Visit (HOSPITAL_COMMUNITY): Payer: Self-pay

## 2021-11-27 ENCOUNTER — Telehealth: Payer: Self-pay

## 2021-11-27 ENCOUNTER — Other Ambulatory Visit: Payer: Self-pay | Admitting: Pharmacist

## 2021-11-27 DIAGNOSIS — B182 Chronic viral hepatitis C: Secondary | ICD-10-CM

## 2021-11-27 MED ORDER — SOFOSBUVIR-VELPATASVIR 400-100 MG PO TABS
1.0000 | ORAL_TABLET | Freq: Every day | ORAL | 2 refills | Status: DC
  Filled 2021-11-27 – 2021-12-01 (×3): qty 28, 28d supply, fill #0
  Filled 2021-12-27: qty 28, 28d supply, fill #1
  Filled 2022-01-24: qty 28, 28d supply, fill #2

## 2021-11-27 NOTE — Telephone Encounter (Signed)
[facility] Patient Advocate Encounter  Prior Authorization for [employer]  has been approved.    PA# [id number] Effective dates: 11/21/21 through 02/15/22  Patients co-pay is $6676.00.   I called [employer] to help with copay.   [facility] will continue to follow.  Clearance Coots, CPhT Specialty Pharmacy Patient [facility] for Infectious Disease Phone: [phone] Fax:  [phone]

## 2021-11-29 ENCOUNTER — Encounter: Payer: Managed Care, Other (non HMO) | Admitting: Physical Therapy

## 2021-11-30 ENCOUNTER — Other Ambulatory Visit (HOSPITAL_COMMUNITY): Payer: Self-pay

## 2021-12-01 ENCOUNTER — Other Ambulatory Visit (HOSPITAL_COMMUNITY): Payer: Self-pay

## 2021-12-01 ENCOUNTER — Telehealth: Payer: Self-pay

## 2021-12-01 NOTE — Telephone Encounter (Signed)
[facility] Patient Advocate Encounter  I was successful in securing patient a $30,000.00 grant from [employer] to provide copayment coverage for [employer].  This will make the out of pocket expense $0.00.     [employer] ID: [id number]  I have spoken with the patient..     The billing information is as follows and has been shared with [facility].    RxBin: [id number] PCN: [id number] Member ID: [id number] Group ID: [id number] Dates of Eligibility: 11/01/21 through 11/01/22  Patient knows to call the office with questions or concerns.  Clearance Coots, CPhT Specialty Pharmacy Patient [facility] for Infectious Disease Phone: [phone] Fax:  [phone]

## 2021-12-04 ENCOUNTER — Other Ambulatory Visit (HOSPITAL_COMMUNITY): Payer: Self-pay

## 2021-12-06 ENCOUNTER — Other Ambulatory Visit (HOSPITAL_COMMUNITY): Payer: Self-pay

## 2021-12-06 ENCOUNTER — Other Ambulatory Visit: Payer: Self-pay

## 2021-12-06 ENCOUNTER — Telehealth: Payer: Self-pay | Admitting: Pharmacist

## 2021-12-06 NOTE — Telephone Encounter (Signed)
Patient is approved to receive Epclusa x 12 weeks for chronic Hepatitis C infection. Counseled patient to take Epclusa daily with or without food. Encouraged patient not to miss any doses and explained how their chance of cure could go down with each dose missed. Counseled patient on what to do if dose is missed - if it is closer to the missed dose take immediately; if closer to next dose skip dose and take the next dose at the usual time. Counseled patient on common side effects such as headache, fatigue, and nausea and that these normally decrease with time. I reviewed patient medications and found no drug interactions. Discussed with patient that there are several drug interactions including acid suppressants. Instructed patient to call clinic if she wishes to start a new medication during course of therapy. Also advised patient to call if she experiences any side effects. Patient will follow-up with Cassie in the pharmacy clinic on 8/2.  Margarite Gouge, PharmD, CPP Clinical Pharmacist Practitioner Infectious Diseases Clinical Pharmacist [facility] for Infectious Disease

## 2021-12-07 ENCOUNTER — Telehealth: Payer: Self-pay

## 2021-12-07 ENCOUNTER — Encounter: Payer: Managed Care, Other (non HMO) | Admitting: Rehabilitative and Restorative Service Providers"

## 2021-12-07 NOTE — Telephone Encounter (Signed)
[facility] Patient Advocate Encounter  Patient's medications have been couriered to [facility] from [facility] and will be picked up 12/06/21.  1st Epclusa box  Clearance Coots , CPhT Specialty Pharmacy Patient [facility] for Infectious Disease Phone: [phone] Fax:  [phone]

## 2021-12-07 NOTE — Telephone Encounter (Signed)
error 

## 2021-12-08 ENCOUNTER — Encounter: Payer: Self-pay | Admitting: Pharmacist

## 2021-12-13 ENCOUNTER — Ambulatory Visit: Payer: Self-pay | Admitting: Internal Medicine

## 2021-12-13 ENCOUNTER — Ambulatory Visit (INDEPENDENT_AMBULATORY_CARE_PROVIDER_SITE_OTHER): Payer: Managed Care, Other (non HMO) | Admitting: Rehabilitative and Restorative Service Providers"

## 2021-12-13 ENCOUNTER — Encounter: Payer: Self-pay | Admitting: Rehabilitative and Restorative Service Providers"

## 2021-12-13 DIAGNOSIS — M6281 Muscle weakness (generalized): Secondary | ICD-10-CM

## 2021-12-13 DIAGNOSIS — R293 Abnormal posture: Secondary | ICD-10-CM

## 2021-12-13 DIAGNOSIS — M5459 Other low back pain: Secondary | ICD-10-CM

## 2021-12-13 DIAGNOSIS — M5416 Radiculopathy, lumbar region: Secondary | ICD-10-CM

## 2021-12-13 DIAGNOSIS — R262 Difficulty in walking, not elsewhere classified: Secondary | ICD-10-CM

## 2021-12-13 NOTE — Therapy (Signed)
OUTPATIENT PHYSICAL THERAPY TREATMENT/PROGRESS NOTE   Patient Name: Sally Reid MRN: [id number] DOB:1960/08/05, 61 y.o., female Today's Date: 12/13/2021  END OF SESSION:   PT End of Session - 12/13/21 1739     Visit Number 6    Number of Visits 12    Date for PT Re-Evaluation 12/11/21   per goals   Authorization Type [employer] HMO - 30 visit limit PT /OT Burna Mortimer    PT Start Time 1148    PT Stop Time 1235    PT Time Calculation (min) 47 min    Activity Tolerance Patient limited by pain    Behavior During Therapy [facility] for tasks assessed/performed            Progress Note Reporting Period 10/16/2021 to 12/13/2021  See note below for Objective Data and Assessment of Progress/Goals.     Past Medical History:  Diagnosis Date   Arthritis    Hypertension    Past Surgical History:  Procedure Laterality Date   ABDOMINAL HYSTERECTOMY     2005   ANKLE FRACTURE SURGERY Left    arthroscopy of left knee  2008   for cartilage surgery   CESAREAN SECTION     2 previous   LEG SURGERY     Patient Active Problem List   Diagnosis Date Noted   Chronic hepatitis C without hepatic coma ([place]) 04/21/2021   Left leg pain 04/18/2021   History of ankle fracture 04/18/2021   Tobacco use 04/18/2021   Essential hypertension 03/23/2020   Fatty liver 03/23/2020   Morbid obesity with BMI of 45.0-49.9, adult ([place]) 03/23/2020   Multiple thyroid nodules 07/14/2018    PCP: Elsie Stain, MD   REFERRING PROVIDER: Newt Minion, MD   REFERRING DIAG: M51.16 (ICD-10-CM) - Intervertebral disc disorders with radiculopathy, lumbar region   ONSET DATE: About 2 months with sciatica, chronic LBP.  THERAPY DIAG:  Abnormal posture  Difficulty in walking, not elsewhere classified  Radiculopathy, lumbar region  Other low back pain  Muscle weakness (generalized)  Rationale for Evaluation and Treatment Rehabilitation  PERTINENT HISTORY: OA, HTN, previous L ankle fracture and L knee  arthroscopy, obesity, chronic hepatitis C  PRECAUTIONS: No surgery protocols  SUBJECTIVE: Jaliya does note progress in how distal her symptoms are vs evaluation.  Objective and functional progress is also noted.  Pain remains higher than we both would like at this point in her PT.  PAIN:  NPRS scale: 4-6/10 Pain location: Low back and L leg to the knee (was foot) Pain description: Ache, sharp leg and sharp/stabbing for the back Aggravating factors: Activity Relieving factors: Tylenol and tylenol arthritis  OBJECTIVE:    DIAGNOSTIC FINDINGS:   10/16/2021 IE : 2 view radiographs of the lumbar spine shows no scoliosis no pars defect.    There is facet arthropathy and degenerative disc disease.    PATIENT SURVEYS:  12/13/2021 FOTO 40 (Goal 58) 10/16/2021 FOTO 41 (Goal 58 in 11 visits)   SCREENING FOR RED FLAGS: 10/16/2021 Bowel or bladder incontinence: No Spinal tumors: No Cauda equina syndrome: No Compression fracture: No Abdominal aneurysm: No   COGNITION: 10/16/2021 Overall cognitive status: Within functional limits for tasks assessed                          SENSATION: 12/13/2021 Symptoms as distal as the knee (were to the foot) 10/16/2021 Notes tingling but no numbness.  She does note some Lt UE tingling as well.  MUSCLE LENGTH: 12/13/2021 Hamstrings: Right 45 deg; Left 45 deg 10/16/2021 Hamstrings: Right 25 deg; Left 25 deg   POSTURE:  12/13/2021 Improved awareness although postures suffers with fatigue/late in the day 10/16/2021 Mild flexion at the hips, IR and protracted shoulders and mild forward head.   LUMBAR ROM:    Active  A/PROM  10/16/2021 AROM 10/23/2021 AROM 11/22/2021 AROM 12/13/2021  Flexion       Extension 5 < 25 % WFL c end range pain lumbar.  Repeated x 5 in standing: continued end range pain lumbar in movement, no worse - no leg symptoms change Movement to neutral c lumbar pain produced (no observed Lt leg symptoms noted).  5 x in standing  improved to 25% c end range pain noted.  10  Right lateral flexion   Movement to lateral epicondyle    Left lateral flexion   Movement to lateral epicondyle     Right rotation       Left rotation        (Blank rows = not tested)   LE ROM:   Passive  Right 10/16/2021 Left 10/16/2021 L/R in degrees 12/13/2021  Hip flexion 70 70 80/80  Hip extension       Hip abduction       Hip adduction       Hip internal rotation     10/10  Hip external rotation     27/30  Knee flexion       Knee extension       Ankle dorsiflexion       Ankle plantarflexion       Ankle inversion       Ankle eversion        (Blank rows = not tested)   LE MMT:   Strength 12/13/2021   Spine strength Testing deferred due to discomfort with test positions                                                 (Blank rows = not tested)   Special Testing lumbar: 10/23/2021:  + Slump testing on Lt  GAIT: 12/13/2021 & 10/16/2021 Comments: [name] notes difficulty walking more than a few minutes.  She notes using a shopping cart helps her walking endurance.  This is a sign of postural weakness.       TODAY'S TREATMENT  12/13/2021 Standing lumbar extension (hands on gluteals and hips forward) 10X 3 seconds Shoulder blade pinches 10X 5 seconds Figure 4 stretch 4X 20 seconds Bridging 2 sets of 5 for 3 seconds Heel to toe raises with transversus abdominus contraction 10X 3 seconds Hip hike in door way 5X 3 seconds B   11/22/21  UBE LE only Lvl 3.0 6 mins  Standing lateral shift correction (wall at right) 2-3 sec hold x 10 (no change in presentation, no LE symptoms produced)  Standing lumbar extension x 10 (end range pain noted, no LE symptoms)  Supine bridge 2-3 sec hold (x8)- stopped due to pain symptoms.   Rt sidelying Lt hip clam shell x 10 (added to home to help improve strength and hip hike ability)    Manual:  percussive device to Lt glute med, Lt and Rt lumbar paraspinals.    11/15/21  Recumbent  bike L1 X 5 min (needed 3 rest breaks to finish)  Standing rows  red X 15  bilat  Standing extensions red  X15 bilat  Supine bridges X15  Supine figure 4 press away stretch 20 sec x 3 bilat  Supine clams green X 20   Supine ab set with shoulder extension into red ball 5 sec X 10,  supine ab set isometric unilat hip flexion 5 sec X 10 ea side  Hip hike 2 sets of 5 bilat with UE support Standing lumbar extensions 3 sec hold X 10    PATIENT EDUCATION:  11/22/2021 Education details: HEP progression.  Lateral hip trigger point presentation education  Person educated: Patient Education method: Explanation, Demonstration, Verbal cues, and Handouts Education comprehension: verbalized understanding, returned demonstration, verbal cues required, tactile cues required, and needs further education     HOME EXERCISE PROGRAM: Access Code: [email or web address] URL: [email or web address] Date: 12/13/2021 Prepared by: Vista Mink  Exercises - Standing Lumbar Extension at [place] 5 x daily - 7 x weekly - 1 sets - 5 reps - 3 seconds hold - Supine Figure 4 Piriformis Stretch  - 2 x daily - 7 x weekly - 1 sets - 4-5 reps - 20 seconds hold - Standing Scapular Retraction  - 5 x daily - 7 x weekly - 1 sets - 5 reps - 5 second hold - Heel Toe Raises with Counter Support  - 2-3 x daily - 7 x weekly - 1 sets - 10 reps - 3 seconds hold - Yoga Bridge  - 2 x daily - 7 x weekly - 2 sets - 5 reps - 5 seconds hold - Standing Hip Hiking  - 2 x daily - 7 x weekly - 2 sets - 5 reps - 3 seconds hold    ASSESSMENT:   CLINICAL IMPRESSION: Sarissa has made some progress with inconsistent PT attendance (6 visits over 2 months).  With consistent attendance and simplification of her HEP to focus on core strength, Mercedez's prognosis remains good.  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  I feel strongly that she has lots of room to improve and that  consistency will be key.   OBJECTIVE IMPAIRMENTS Abnormal gait, decreased activity tolerance, decreased endurance, decreased knowledge of condition, decreased mobility, difficulty walking, decreased ROM, decreased strength, decreased safety awareness, impaired perceived functional ability, increased muscle spasms, impaired flexibility, improper body mechanics, postural dysfunction, obesity, and pain.    ACTIVITY LIMITATIONS community activity, driving, occupation, and prolonged postures .    PERSONAL FACTORS OA, HTN, previous L ankle fracture and L knee arthroscopy, obesity, chronic hepatitis C are also affecting patient's functional outcome.      REHAB POTENTIAL: Good   CLINICAL DECISION MAKING: Stable/uncomplicated   EVALUATION COMPLEXITY: Low     GOALS: Goals reviewed with patient? Yes   SHORT TERM GOALS: Target date: 11/13/2021      Improve Lt leg pain to consistently 0-3/10 on the Numeric Pain Rating Scale.  Goal status: Partially met- assessed 12/13/2021   2.  Aerilyn will be able to demonstrate correct sitting, standing posture and correct bed mobility and lifting mechanics.  Goal status: Partially met/inconsistent- assessed 12/13/2021   3.  Improve trunk extension AROM to 15 degrees.  Goal status: On Going- assessed 12/13/2021   LONG TERM GOALS: Target date: 12/11/2021     Improve low back pain to consistently 0-3/10 on the Numeric Pain Rating Scale. Baseline: 4-6/10 Goal status: On going - assessed 12/13/2021   2.  Improve FOTO to 58. Baseline: 41 Goal status: On  going - assessed 12/13/2021   3.  Improve bilateral LE flexibility for hip flexors to 90 and hamstrings to 45 degrees. Baseline: 70 and 45 degrees, respectively Goal status: On going/improved - assessed 12/13/2021   4.  Improve spine strength as assessed by FOTO and objective testing. Baseline: Objective testing deferred at evaluation due to difficulty assuming test postures. Goal status: On going -  assessed 12/13/2021   5.  Kaitlen will be independent with her long-term HEP at [place]. Baseline: Started 10/16/2021 Goal status: On going - assessed 12/13/2021   PLAN: PT FREQUENCY: 1-2x/week   PT DURATION: 4-6 additional weeks   PLANNED INTERVENTIONS: Therapeutic exercises, Therapeutic activity, Neuromuscular re-education, Gait training, Patient/Family education, Joint mobilization, Dry Needling, Cryotherapy, Traction, and Manual therapy.   PLAN FOR NEXT SESSION:  Core strength (erector spinae, hip abductors, quadratus lumborum), practical posture and body mechanics work, general LE strength to encourage correct mechanics at work and home.  Farley Ly PT, MPT 12/13/21  5:40 PM

## 2021-12-20 ENCOUNTER — Encounter: Payer: Commercial Managed Care - HMO | Admitting: Rehabilitative and Restorative Service Providers"

## 2021-12-26 ENCOUNTER — Other Ambulatory Visit (HOSPITAL_COMMUNITY): Payer: Self-pay

## 2021-12-27 ENCOUNTER — Other Ambulatory Visit (HOSPITAL_COMMUNITY): Payer: Self-pay

## 2021-12-27 ENCOUNTER — Encounter: Payer: Self-pay | Admitting: Rehabilitative and Restorative Service Providers"

## 2021-12-27 ENCOUNTER — Ambulatory Visit (INDEPENDENT_AMBULATORY_CARE_PROVIDER_SITE_OTHER): Payer: Commercial Managed Care - HMO | Admitting: Rehabilitative and Restorative Service Providers"

## 2021-12-27 DIAGNOSIS — R293 Abnormal posture: Secondary | ICD-10-CM

## 2021-12-27 DIAGNOSIS — M6281 Muscle weakness (generalized): Secondary | ICD-10-CM

## 2021-12-27 DIAGNOSIS — R262 Difficulty in walking, not elsewhere classified: Secondary | ICD-10-CM | POA: Diagnosis not present

## 2021-12-27 DIAGNOSIS — M5416 Radiculopathy, lumbar region: Secondary | ICD-10-CM

## 2021-12-27 DIAGNOSIS — M5459 Other low back pain: Secondary | ICD-10-CM

## 2021-12-27 NOTE — Therapy (Signed)
OUTPATIENT PHYSICAL THERAPY TREATMENT NOTE   Patient Name: Sally Reid MRN: [id number] DOB:[birth year], 61 y.o., female Today's Date: 12/27/2021  END OF SESSION:   PT End of Session - 12/27/21 1422     Visit Number 7    Number of Visits 12    Date for PT Re-Evaluation 12/11/21   per goals   Authorization Type [employer] - 30 visit limit PT /OT Sally Reid    PT Start Time 1422    PT Stop Time 1512    PT Time Calculation (min) 50 min    Activity Tolerance Patient tolerated treatment well;Patient limited by fatigue    Behavior During Therapy [facility] for tasks assessed/performed             Past Medical History:  Diagnosis Date   Arthritis    Hypertension    Past Surgical History:  Procedure Laterality Date   ABDOMINAL HYSTERECTOMY     2005   ANKLE FRACTURE SURGERY Left    arthroscopy of left knee  2008   for cartilage surgery   CESAREAN SECTION     2 previous   LEG SURGERY     Patient Active Problem List   Diagnosis Date Noted   Chronic hepatitis C without hepatic coma ([place]) 04/21/2021   Left leg pain 04/18/2021   History of ankle fracture 04/18/2021   Tobacco use 04/18/2021   Essential hypertension 03/23/2020   Fatty liver 03/23/2020   Morbid obesity with BMI of 45.0-49.9, adult ([place]) 03/23/2020   Multiple thyroid nodules 07/14/2018    PCP: Sally Stain, MD   REFERRING PROVIDER: Newt Minion, MD   REFERRING DIAG: M51.16 (ICD-10-CM) - Intervertebral disc disorders with radiculopathy, lumbar region   ONSET DATE: About 2 months with sciatica, chronic LBP.  THERAPY DIAG:  Abnormal posture  Difficulty in walking, not elsewhere classified  Radiculopathy, lumbar region  Other low back pain  Muscle weakness (generalized)  Rationale for Evaluation and Treatment Rehabilitation  PERTINENT HISTORY: OA, HTN, previous L ankle fracture and L knee arthroscopy, obesity, chronic hepatitis C  PRECAUTIONS: No surgery protocols  SUBJECTIVE: Sally Reid does note  progress in how distal her symptoms are vs evaluation.  Objective and functional progress is also noted.  Pain remains higher than we both would like at this point in her PT.  PAIN:  NPRS scale: 4-6/10 Pain location: Low back and L leg to just above the knee (was foot) Pain description: Ache, sharp leg and sharp/stabbing for the back Aggravating factors: Activity Relieving factors: Tylenol and tylenol arthritis  OBJECTIVE:    DIAGNOSTIC FINDINGS:   10/16/2021 IE : 2 view radiographs of the lumbar spine shows no scoliosis no pars defect.    There is facet arthropathy and degenerative disc disease.    PATIENT SURVEYS:  12/13/2021 FOTO 40 (Goal 58) 10/16/2021 FOTO 41 (Goal 58 in 11 visits)   SCREENING FOR RED FLAGS: 10/16/2021 Bowel or bladder incontinence: No Spinal tumors: No Cauda equina syndrome: No Compression fracture: No Abdominal aneurysm: No   COGNITION: 10/16/2021 Overall cognitive status: Within functional limits for tasks assessed                          SENSATION: 12/13/2021 Symptoms as distal as the knee (were to the foot) 10/16/2021 Notes tingling but no numbness.  She does note some Lt UE tingling as well.   MUSCLE LENGTH: 12/13/2021 Hamstrings: Right 45 deg; Left 45 deg 10/16/2021 Hamstrings: Right 25 deg; Left  25 deg   POSTURE:  12/13/2021 Improved awareness although postures suffers with fatigue/late in the day 10/16/2021 Mild flexion at the hips, IR and protracted shoulders and mild forward head.   LUMBAR ROM:    Active  A/PROM  10/16/2021 AROM 10/23/2021 AROM 11/22/2021 AROM 12/13/2021  Flexion       Extension 5 < 25 % WFL c end range pain lumbar.  Repeated x 5 in standing: continued end range pain lumbar in movement, no worse - no leg symptoms change Movement to neutral c lumbar pain produced (no observed Lt leg symptoms noted).  5 x in standing improved to 25% c end range pain noted.  10  Right lateral flexion   Movement to lateral epicondyle    Left  lateral flexion   Movement to lateral epicondyle     Right rotation       Left rotation        (Blank rows = not tested)   LE ROM:   Passive  Right 10/16/2021 Left 10/16/2021 L/R in degrees 12/13/2021  Hip flexion 70 70 80/80  Hip extension       Hip abduction       Hip adduction       Hip internal rotation     10/10  Hip external rotation     27/30  Knee flexion       Knee extension       Ankle dorsiflexion       Ankle plantarflexion       Ankle inversion       Ankle eversion        (Blank rows = not tested)   LE MMT:   Strength 12/13/2021   Spine strength Testing deferred due to discomfort with test positions                                                 (Blank rows = not tested)   Special Testing lumbar: 10/23/2021:  + Slump testing on Lt  GAIT: 12/13/2021 & 10/16/2021 Comments: Sally Reid notes difficulty walking more than a few minutes.  She notes using a shopping cart helps her walking endurance.  This is a sign of postural weakness.       TODAY'S TREATMENT  12/27/2021  Standing lumbar extension (hands on gluteals and hips forward) 10X 3 seconds Shoulder blade pinches 10X 5 seconds Figure 4 stretch 4X 20 seconds Bridging 2 sets of 5 for 3 seconds Heel to toe raises with transversus abdominus contraction 10X 3 seconds Hip hike in door way 5X 3 seconds B Prone hip extension 2 sets of 10 3 seconds  Functional Activities: Practical bed mobility, sleeping positions, review of spine anatomy and disc pressure in various postures   12/13/2021 Standing lumbar extension (hands on gluteals and hips forward) 10X 3 seconds Shoulder blade pinches 10X 5 seconds Figure 4 stretch 4X 20 seconds Bridging 2 sets of 5 for 3 seconds Heel to toe raises with transversus abdominus contraction 10X 3 seconds Hip hike in door way 5X 3 seconds B   11/22/21  UBE LE only Lvl 3.0 6 mins  Standing lateral shift correction (wall at right) 2-3 sec hold x 10 (no change in  presentation, no LE symptoms produced)  Standing lumbar extension x 10 (end range pain noted, no LE symptoms)  Supine bridge 2-3 sec hold (  x8)- stopped due to pain symptoms.   Rt sidelying Lt hip clam shell x 10 (added to home to help improve strength and hip hike ability)    Manual:  percussive device to Lt glute med, Lt and Rt lumbar paraspinals.     PATIENT EDUCATION:  11/22/2021 Education details: HEP progression.  Lateral hip trigger point presentation education  Person educated: Patient Education method: Explanation, Demonstration, Verbal cues, and Handouts Education comprehension: verbalized understanding, returned demonstration, verbal cues required, tactile cues required, and needs further education     HOME EXERCISE PROGRAM: Access Code: [email or web address] URL: [email or web address] Date: 12/27/2021 Prepared by: Vista Mink  Exercises - Standing Lumbar Extension at [place] 5 x daily - 7 x weekly - 1 sets - 5 reps - 3 seconds hold - Supine Figure 4 Piriformis Stretch  - 2 x daily - 7 x weekly - 1 sets - 4-5 reps - 20 seconds hold - Standing Scapular Retraction  - 5 x daily - 7 x weekly - 1 sets - 5 reps - 5 second hold - Yoga Bridge  - 2 x daily - 7 x weekly - 2 sets - 5 reps - 5 seconds hold - Standing Hip Hiking  - 2 x daily - 7 x weekly - 2 sets - 5 reps - 3 seconds hold - Prone Hip Extension  - 1 x daily - 7 x weekly - 2-3 sets - 10 reps - 3-10 seconds hold   ASSESSMENT:   CLINICAL IMPRESSION: Karlie notes continued slight progress with her peripheral symptoms.  We spent most of her treatment today on practical work and did progress her strength component as well.  Although mechanics are more consistent, Rex still will slip as when she sat straight up today off the treatment table vs log rolling.  Continue strength and practical work to meet long-term goals.  OBJECTIVE IMPAIRMENTS Abnormal gait, decreased activity tolerance, decreased  endurance, decreased knowledge of condition, decreased mobility, difficulty walking, decreased ROM, decreased strength, decreased safety awareness, impaired perceived functional ability, increased muscle spasms, impaired flexibility, improper body mechanics, postural dysfunction, obesity, and pain.    ACTIVITY LIMITATIONS community activity, driving, occupation, and prolonged postures .    PERSONAL FACTORS OA, HTN, previous L ankle fracture and L knee arthroscopy, obesity, chronic hepatitis C are also affecting patient's functional outcome.      REHAB POTENTIAL: Good   CLINICAL DECISION MAKING: Stable/uncomplicated   EVALUATION COMPLEXITY: Low     GOALS: Goals reviewed with patient? Yes   SHORT TERM GOALS: Target date: 11/13/2021      Improve Lt leg pain to consistently 0-3/10 on the Numeric Pain Rating Scale.  Goal status: Partially met- assessed 12/27/2021   2.  Lulie will be able to demonstrate correct sitting, standing posture and correct bed mobility and lifting mechanics.  Goal status: Partially met/inconsistent- assessed 12/27/2021   3.  Improve trunk extension AROM to 15 degrees.  Goal status: On Going- assessed 12/13/2021   LONG TERM GOALS: Target date: 12/11/2021     Improve low back pain to consistently 0-3/10 on the Numeric Pain Rating Scale. Baseline: 4-6/10 Goal status: On going - assessed 12/27/2021   2.  Improve FOTO to 58. Baseline: 41 Goal status: On going - assessed 12/13/2021   3.  Improve bilateral LE flexibility for hip flexors to 90 and hamstrings to 45 degrees. Baseline: 70 and 45 degrees, respectively Goal status: On going/improved - assessed 12/13/2021   4.  Improve spine strength  as assessed by FOTO and objective testing. Baseline: Objective testing deferred at evaluation due to difficulty assuming test postures. Goal status: On going - assessed 12/13/2021   5.  Octa will be independent with her long-term HEP at DC. Baseline: Started  10/16/2021 Goal status: On going - assessed 12/27/2021   PLAN: PT FREQUENCY: 1-2x/week   PT DURATION: 4-6 additional weeks   PLANNED INTERVENTIONS: Therapeutic exercises, Therapeutic activity, Neuromuscular re-education, Gait training, Patient/Family education, Joint mobilization, Dry Needling, Cryotherapy, Traction, and Manual therapy.   PLAN FOR NEXT SESSION:  Core strength (erector spinae, hip abductors, quadratus lumborum), practical posture and body mechanics work, general LE strength to encourage correct mechanics at work and home.  Farley Ly PT, MPT 12/27/21  3:19 PM

## 2021-12-29 ENCOUNTER — Other Ambulatory Visit (HOSPITAL_COMMUNITY): Payer: Self-pay

## 2022-01-01 ENCOUNTER — Ambulatory Visit (INDEPENDENT_AMBULATORY_CARE_PROVIDER_SITE_OTHER): Payer: Commercial Managed Care - HMO | Admitting: Orthopedic Surgery

## 2022-01-01 DIAGNOSIS — M25572 Pain in left ankle and joints of left foot: Secondary | ICD-10-CM

## 2022-01-01 DIAGNOSIS — M5116 Intervertebral disc disorders with radiculopathy, lumbar region: Secondary | ICD-10-CM | POA: Diagnosis not present

## 2022-01-01 MED ORDER — TRAMADOL HCL 50 MG PO TABS: 50.0000 mg | ORAL_TABLET | Freq: Four times a day (QID) | ORAL | 0 refills | Status: DC | PRN

## 2022-01-02 ENCOUNTER — Other Ambulatory Visit (HOSPITAL_COMMUNITY): Payer: Self-pay

## 2022-01-02 ENCOUNTER — Other Ambulatory Visit: Payer: Self-pay

## 2022-01-02 ENCOUNTER — Encounter: Payer: Self-pay | Admitting: Orthopedic Surgery

## 2022-01-02 NOTE — Progress Notes (Signed)
Office Visit Note   [name]           Date of Birth: [birth year]           MRN: [id number] Visit Date: 01/01/2022              Requested by: Storm Frisk, MD [address] [place],  [place] [address] PCP: Storm Frisk, MD  Chief Complaint  Patient presents with   Lower Back - Follow-up      HPI: Patient is a 61 year old woman who presents with 2 separate issues.  #1 chronic left ankle pain status post intra-articular ankle injection once she states caused headaches but did not relieve her ankle pain.  Patient has currently been going to physical therapy upstairs and has completed his 6 weeks of physical therapy and is scheduled for 4 more weeks.  She states she still has left-sided radicular symptoms from the lumbar spine that radiates to the lateral thigh.  Assessment & Plan: Visit Diagnoses:  1. Intervertebral disc disorders with radiculopathy, lumbar region   2. Pain in left ankle and joints of left foot     Plan: We will request an MRI scan of the lumbar spine patient may be a candidate for epidural steroid injections.  Discussed that the ankle pain most likely is secondary to calcification of the syndesmosis.  Patient is provided a prescription for Ultram.  Follow-Up Instructions: Return in about 4 weeks (around 01/29/2022).   Ortho Exam  Patient is alert, oriented, no adenopathy, well-dressed, normal affect, normal respiratory effort. Examination patient has pain to palpation over the syndesmosis pain with lateral compression of the syndesmosis.  Anterior drawer is stable anterior joint line is nontender to palpation.  Examination of the left lower extremity patient has no pain with passive range of motion of the hip knee or ankle.  She has a negative straight leg raise motor strength is symmetric without weakness.  Imaging: No results found. No images are attached to the encounter.  Labs: Lab Results  Component Value Date   HGBA1C   04/11/2009    5.2 (NOTE) The ADA recommends the following therapeutic goal for glycemic control related to Hgb A1c measurement: Goal of therapy: <6.5 Hgb A1c  Reference: American Diabetes Association: Clinical Practice Recommendations 2010, Diabetes Care, 2010, 33: (Suppl  1).   REPTSTATUS 03/15/2020 FINAL 03/14/2020   CULT (A) 03/14/2020    <10,000 COLONIES/mL INSIGNIFICANT GROWTH Performed at [facility], [address], [place], [place] [address]      Lab Results  Component Value Date   ALBUMIN 3.5 04/07/2021   ALBUMIN 3.5 03/14/2020   ALBUMIN 3.6 11/28/2015    Lab Results  Component Value Date   MG 1.9 04/11/2009   No results found for: "VD25OH"  No results found for: "PREALBUMIN"    Latest Ref Rng & Units 11/15/2021    2:19 PM 04/07/2021    7:55 PM 03/14/2020    8:37 AM  CBC EXTENDED  WBC 3.8 - 10.8 Thousand/uL 6.9  7.2  6.0   RBC 3.80 - 5.10 Million/uL 4.75  4.73  5.04   Hemoglobin 11.7 - 15.5 g/dL 25.0  53.9  76.7   HCT 35.0 - 45.0 % 40.9  41.6  43.8   Platelets 140 - 400 Thousand/uL 273  225  212   NEUT# 1.7 - 7.7 K/uL  2.5  2.4   Lymph# 0.7 - 4.0 K/uL  3.9  3.0  There is no height or weight on file to calculate BMI.  Orders:  Orders Placed This Encounter  Procedures   MR Lumbar Spine w/o contrast   Meds ordered this encounter  Medications   traMADol (ULTRAM) 50 MG tablet    Sig: Take 1 tablet (50 mg total) by mouth every 6 (six) hours as needed for moderate pain.    Dispense:  30 tablet    Refill:  0     Procedures: No procedures performed  Clinical Data: No additional findings.  ROS:  All other systems negative, except as noted in the HPI. Review of Systems  Objective: Vital Signs: There were no vitals taken for this visit.  Specialty Comments:  No specialty comments available.  PMFS History: Patient Active Problem List   Diagnosis Date Noted   Chronic hepatitis C without hepatic coma ([facility]) 04/21/2021   Left leg pain  04/18/2021   History of ankle fracture 04/18/2021   Tobacco use 04/18/2021   Essential hypertension 03/23/2020   Fatty liver 03/23/2020   Morbid obesity with BMI of 45.0-49.9, adult ([facility]) 03/23/2020   Multiple thyroid nodules 07/14/2018   Past Medical History:  Diagnosis Date   Arthritis    Hypertension     Family History  Problem Relation Age of Onset   Diabetes Mother    Hypertension Mother    Lymphoma Brother     Past Surgical History:  Procedure Laterality Date   ABDOMINAL HYSTERECTOMY     2005   ANKLE FRACTURE SURGERY Left    arthroscopy of left knee  2008   for cartilage surgery   CESAREAN SECTION     2 previous   LEG SURGERY     Social History   Occupational History   Occupation: unemployed    Comment: without health insurance  Tobacco Use   Smoking status: Some Days    Packs/day: 0.25    Types: Cigarettes   Smokeless tobacco: Never  Vaping Use   Vaping Use: Never used  Substance and Sexual Activity   Alcohol use: Yes    Comment: occassionally   Drug use: No   Sexual activity: Yes    Birth control/protection: Surgical

## 2022-01-03 ENCOUNTER — Other Ambulatory Visit: Payer: Self-pay

## 2022-01-03 ENCOUNTER — Ambulatory Visit (INDEPENDENT_AMBULATORY_CARE_PROVIDER_SITE_OTHER): Payer: Commercial Managed Care - HMO | Admitting: Rehabilitative and Restorative Service Providers"

## 2022-01-03 ENCOUNTER — Ambulatory Visit (INDEPENDENT_AMBULATORY_CARE_PROVIDER_SITE_OTHER): Payer: Commercial Managed Care - HMO | Admitting: Pharmacist

## 2022-01-03 ENCOUNTER — Encounter: Payer: Self-pay | Admitting: Rehabilitative and Restorative Service Providers"

## 2022-01-03 DIAGNOSIS — M6281 Muscle weakness (generalized): Secondary | ICD-10-CM

## 2022-01-03 DIAGNOSIS — M5416 Radiculopathy, lumbar region: Secondary | ICD-10-CM

## 2022-01-03 DIAGNOSIS — R262 Difficulty in walking, not elsewhere classified: Secondary | ICD-10-CM

## 2022-01-03 DIAGNOSIS — M5459 Other low back pain: Secondary | ICD-10-CM

## 2022-01-03 DIAGNOSIS — R293 Abnormal posture: Secondary | ICD-10-CM

## 2022-01-03 DIAGNOSIS — B182 Chronic viral hepatitis C: Secondary | ICD-10-CM

## 2022-01-03 NOTE — Therapy (Addendum)
OUTPATIENT PHYSICAL THERAPY TREATMENT/DISCHARGE NOTE   Patient Name: Sally Reid MRN: [id number] DOB:[birth year], 61 y.o., female Today's Date: 01/03/2022  PHYSICAL THERAPY DISCHARGE SUMMARY  Visits from Start of Care: 8  Current functional level related to goals / functional outcomes: See note   Remaining deficits: See note   Education / Equipment: HEP   Patient agrees to discharge. Patient goals were  on going, partially met . Patient is being discharged due to not returning since the last visit.   END OF SESSION:   PT End of Session - 01/03/22 1307     Visit Number 8    Number of Visits 12    Date for PT Re-Evaluation 12/11/21   per goals   Authorization Type [employer] HMO - 30 visit limit PT /OT /CHIRO    PT Start Time 1304    PT Stop Time 1343    PT Time Calculation (min) 39 min    Activity Tolerance Patient tolerated treatment well;Patient limited by fatigue    Behavior During Therapy [facility] for tasks assessed/performed              Past Medical History:  Diagnosis Date   Arthritis    Hypertension    Past Surgical History:  Procedure Laterality Date   ABDOMINAL HYSTERECTOMY     2005   ANKLE FRACTURE SURGERY Left    arthroscopy of left knee  2008   for cartilage surgery   CESAREAN SECTION     2 previous   LEG SURGERY     Patient Active Problem List   Diagnosis Date Noted   Chronic hepatitis C without hepatic coma ([place]) 04/21/2021   Left leg pain 04/18/2021   History of ankle fracture 04/18/2021   Tobacco use 04/18/2021   Essential hypertension 03/23/2020   Fatty liver 03/23/2020   Morbid obesity with BMI of 45.0-49.9, adult ([place]) 03/23/2020   Multiple thyroid nodules 07/14/2018    PCP: Elsie Stain, MD   REFERRING PROVIDER: Newt Minion, MD   REFERRING DIAG: M51.16 (ICD-10-CM) - Intervertebral disc disorders with radiculopathy, lumbar region   ONSET DATE: About 2 months with sciatica, chronic LBP.  THERAPY DIAG:  Abnormal  posture  Difficulty in walking, not elsewhere classified  Radiculopathy, lumbar region  Other low back pain  Muscle weakness (generalized)  Rationale for Evaluation and Treatment Rehabilitation  PERTINENT HISTORY: OA, HTN, previous L ankle fracture and L knee arthroscopy, obesity, chronic hepatitis C  PRECAUTIONS: No surgery protocols  SUBJECTIVE: Sally Reid has been getting about 3-4 hours of uninterrupted sleep.  She is using 1 pillow for her head and one between her knees (several pillows or a body pillow recommended for support and better sleep).  Overall objective and functional progress is noted.  Pain is improved but remains higher than we both would like at this point in her PT.  PAIN:  NPRS scale: 3-5/10 (was 4-6/10) Pain location: Low back and L leg to just above the knee (was foot) Pain description: Ache, sharp leg and sharp/stabbing for the back Aggravating factors: Activity Relieving factors: Tylenol and tylenol arthritis  OBJECTIVE:    DIAGNOSTIC FINDINGS:   10/16/2021 IE : 2 view radiographs of the lumbar spine shows no scoliosis no pars defect.    There is facet arthropathy and degenerative disc disease.    PATIENT SURVEYS:  12/13/2021 FOTO 40 (Goal 58) 10/16/2021 FOTO 41 (Goal 58 in 11 visits)   SCREENING FOR RED FLAGS: 10/16/2021 Bowel or bladder incontinence: No Spinal tumors: No  Cauda equina syndrome: No Compression fracture: No Abdominal aneurysm: No   COGNITION: 10/16/2021 Overall cognitive status: Within functional limits for tasks assessed                          SENSATION: 12/13/2021 Symptoms as distal as the knee (were to the foot) 10/16/2021 Notes tingling but no numbness.  She does note some Lt UE tingling as well.   MUSCLE LENGTH: 12/13/2021 Hamstrings: Right 45 deg; Left 45 deg 10/16/2021 Hamstrings: Right 25 deg; Left 25 deg   POSTURE:  12/13/2021 Improved awareness although postures suffers with fatigue/late in the day 10/16/2021  Mild flexion at the hips, IR and protracted shoulders and mild forward head.   LUMBAR ROM:    Active  A/PROM  10/16/2021 AROM 10/23/2021 AROM 11/22/2021 AROM 12/13/2021  Flexion       Extension 5 < 25 % WFL c end range pain lumbar.  Repeated x 5 in standing: continued end range pain lumbar in movement, no worse - no leg symptoms change Movement to neutral c lumbar pain produced (no observed Lt leg symptoms noted).  5 x in standing improved to 25% c end range pain noted.  10  Right lateral flexion   Movement to lateral epicondyle    Left lateral flexion   Movement to lateral epicondyle     Right rotation       Left rotation        (Blank rows = not tested)   LE ROM:   Passive  Right 10/16/2021 Left 10/16/2021 L/R in degrees 12/13/2021 L/R in degrees 01/03/2022  Hip flexion 70 70 80/80 90/90  Hip extension        Hip abduction        Hip adduction        Hip internal rotation     10/10 13/15  Hip external rotation     27/30 42/35  Knee flexion        Knee extension        Ankle dorsiflexion        Ankle plantarflexion        Ankle inversion        Ankle eversion         (Blank rows = not tested)   LE MMT:   Strength 12/13/2021   Spine strength Testing deferred due to discomfort with test positions                                                 (Blank rows = not tested)   Special Testing lumbar: 10/23/2021:  + Slump testing on Lt  GAIT: 12/13/2021 & 10/16/2021 Comments: Sally Reid notes difficulty walking more than a few minutes.  She notes using a shopping cart helps her walking endurance.  This is a sign of postural weakness.       TODAY'S TREATMENT  01/03/2022  Standing lumbar extension (hands on gluteals and hips forward) 10X 3 seconds Shoulder blade pinches 10X 5 seconds Figure 4 stretch 4X 20 seconds Modified Thomas stretch 4X 20 seconds Bridging 3 sets of 5 for 5 seconds  Hip hike in door way 5X 3 seconds B Prone hip extension 2 sets of 10 5  seconds  Functional Activities: Review of practical bed mobility, sleeping positions (emphasis on pillows for support), review of spine  anatomy and disc pressure in various postures   12/27/2021  Standing lumbar extension (hands on gluteals and hips forward) 10X 3 seconds Shoulder blade pinches 10X 5 seconds Figure 4 stretch 4X 20 seconds Bridging 2 sets of 5 for 3 seconds Heel to toe raises with transversus abdominus contraction 10X 3 seconds Hip hike in door way 5X 3 seconds B Prone hip extension 2 sets of 10 3 seconds  Functional Activities: Practical bed mobility, sleeping positions, review of spine anatomy and disc pressure in various postures   12/13/2021 Standing lumbar extension (hands on gluteals and hips forward) 10X 3 seconds Shoulder blade pinches 10X 5 seconds Figure 4 stretch 4X 20 seconds Bridging 2 sets of 5 for 3 seconds Heel to toe raises with transversus abdominus contraction 10X 3 seconds Hip hike in door way 5X 3 seconds B    PATIENT EDUCATION:  11/22/2021 Education details: HEP progression.  Lateral hip trigger point presentation education  Person educated: Patient Education method: Explanation, Demonstration, Verbal cues, and Handouts Education comprehension: verbalized understanding, returned demonstration, verbal cues required, tactile cues required, and needs further education     HOME EXERCISE PROGRAM: Access Code: [email or web address] URL: [email or web address] Date: 01/03/2022 Prepared by: Vista Mink  Exercises - Standing Lumbar Extension at [place]  - 5 x daily - 7 x weekly - 1 sets - 5 reps - 3 seconds hold - Supine Figure 4 Piriformis Stretch  - 1 x daily - 7 x weekly - 1 sets - 4-5 reps - 20 seconds hold - Modified Thomas Stretch  - 1 x daily - 7 x weekly - 1 sets - 4-5 reps - 20 seconds hold - Standing Scapular Retraction  - 5 x daily - 7 x weekly - 1 sets - 5 reps - 5 second hold - Yoga Bridge  - 2 x daily - 7 x weekly - 2  sets - 10 reps - 5 seconds hold - Standing Hip Hiking  - 2 x daily - 7 x weekly - 2 sets - 5 reps - 3 seconds hold - Prone Hip Extension  - 1 x daily - 7 x weekly - 2-3 sets - 10 reps - 5-10 seconds hold    ASSESSMENT:   CLINICAL IMPRESSION: Hisayo notes slow but noticeable progress with her pain and function since starting PT.  Objective hip flexibility was better today and trunk AROM will be assessed next visit along with FOTO.  Based on signs of fatigue early with her low back and core strength exercises, Ainslee is still weak and will benefit from continued strength work and objective strength assessment when appropriate (when she can comfortably handle the test position).  Continue strength and practical work to meet long-term goals.  OBJECTIVE IMPAIRMENTS Abnormal gait, decreased activity tolerance, decreased endurance, decreased knowledge of condition, decreased mobility, difficulty walking, decreased ROM, decreased strength, decreased safety awareness, impaired perceived functional ability, increased muscle spasms, impaired flexibility, improper body mechanics, postural dysfunction, obesity, and pain.    ACTIVITY LIMITATIONS community activity, driving, occupation, and prolonged postures .    PERSONAL FACTORS OA, HTN, previous L ankle fracture and L knee arthroscopy, obesity, chronic hepatitis C are also affecting patient's functional outcome.      REHAB POTENTIAL: Good   CLINICAL DECISION MAKING: Stable/uncomplicated   EVALUATION COMPLEXITY: Low     GOALS: Goals reviewed with patient? Yes   SHORT TERM GOALS: Target date: 11/13/2021      Improve Lt leg pain to consistently 0-3/10 on the Numeric  Pain Rating Scale.  Goal status: Partially met- assessed 01/03/2022   2.  Marcia will be able to demonstrate correct sitting, standing posture and correct bed mobility and lifting mechanics.  Goal status:Met- assessed 01/03/2022   3.  Improve trunk extension AROM to 15  degrees.  Goal status: On Going- assessed 12/13/2021   LONG TERM GOALS: Target date: 12/11/2021     Improve low back pain to consistently 0-3/10 on the Numeric Pain Rating Scale. Baseline: 4-6/10 Goal status: On going - assessed 01/03/2022   2.  Improve FOTO to 58. Baseline: 41 Goal status: On going - assessed 12/13/2021   3.  Improve bilateral LE flexibility for hip flexors to 90 and hamstrings to 45 degrees. Baseline: 70 and 45 degrees, respectively Goal status: Met - assessed 01/03/2022   4.  Improve spine strength as assessed by FOTO and objective testing. Baseline: Objective testing deferred at evaluation due to difficulty assuming test postures. Goal status: On going - assessed 8/22023   5.  Orion will be independent with her long-term HEP at [place]. Baseline: Started 10/16/2021 Goal status: On going - assessed 01/03/2022   PLAN: PT FREQUENCY: 1-2x/week   PT DURATION: 4-6 additional weeks   PLANNED INTERVENTIONS: Therapeutic exercises, Therapeutic activity, Neuromuscular re-education, Gait training, Patient/Family education, Joint mobilization, Dry Needling, Cryotherapy, Traction, and Manual therapy.   PLAN FOR NEXT SESSION:  Continue core strength (erector spinae, hip abductors, quadratus lumborum), practical posture and body mechanics work, general LE strength to encourage correct mechanics at work and home.  Farley Ly PT, MPT 01/03/22  2:44 PM

## 2022-01-03 NOTE — Progress Notes (Signed)
01/03/2022  HPI: Sally Reid is a 61 y.o. female who presents to the [facility] pharmacy clinic for Hepatitis C follow-up.  Medication: Epclusa x 12 weeks  Start Date: 12/07/21  Hepatitis C Genotype: 1a  Hepatitis C RNA: 1.8 million on 11/15/21  Patient Active Problem List   Diagnosis Date Noted   Chronic hepatitis C without hepatic coma ([facility]) 04/21/2021   Left leg pain 04/18/2021   History of ankle fracture 04/18/2021   Tobacco use 04/18/2021   Essential hypertension 03/23/2020   Fatty liver 03/23/2020   Morbid obesity with BMI of 45.0-49.9, adult ([facility]) 03/23/2020   Multiple thyroid nodules 07/14/2018    Patient's Medications  New Prescriptions   No medications on file  Previous Medications   APPLE CIDER VINEGAR 500 MG TABS    Take 1 each by mouth.   CHLORTHALIDONE (HYGROTON) 25 MG TABLET    Take 1 tablet (25 mg total) by mouth daily.   DICLOFENAC SODIUM (VOLTAREN) 1 % GEL    Apply 2 g topically 4 (four) times daily. To left ankle   SOFOSBUVIR-VELPATASVIR (EPCLUSA) 400-100 MG TABS    Take 1 tablet by mouth daily.   TRAMADOL (ULTRAM) 50 MG TABLET    Take 1 tablet (50 mg total) by mouth every 6 (six) hours as needed for moderate pain.  Modified Medications   No medications on file  Discontinued Medications   No medications on file    Allergies: No Known Allergies  Past Medical History: Past Medical History:  Diagnosis Date   Arthritis    Hypertension     Social History: Social History   Socioeconomic History   Marital status: Married    Spouse name: Not on file   Number of children: Not on file   Years of education: Not on file   Highest education level: Not on file  Occupational History   Occupation: unemployed    Comment: without health insurance  Tobacco Use   Smoking status: Some Days    Packs/day: 0.25    Types: Cigarettes   Smokeless tobacco: Never  Vaping Use   Vaping Use: Never used  Substance and Sexual Activity   Alcohol use: Yes    Comment:  occassionally   Drug use: No   Sexual activity: Yes    Birth control/protection: Surgical  Other Topics Concern   Not on file  Social History Narrative   Lives in [place].   Had 2 children( two boys 30 and 29).   Living with fiancee.   Social Determinants of Health   Financial Resource Strain: Not on file  Food Insecurity: Not on file  Transportation Needs: Not on file  Physical Activity: Not on file  Stress: Not on file  Social Connections: Not on file    Labs: Hepatitis C Lab Results  Component Value Date   HCVGENOTYPE 1a 05/03/2021   HCVRNAPCRQN 1,820,000 (H) 11/15/2021   Hepatitis B Lab Results  Component Value Date   HEPBSAG NON-REACTIVE 05/03/2021   HEPBCAB NON-REACTIVE 05/03/2021   Hepatitis A No results found for: "HAV" HIV Lab Results  Component Value Date   HIV Non Reactive 04/18/2021   Lab Results  Component Value Date   CREATININE 0.79 11/15/2021   CREATININE 0.83 09/27/2021   CREATININE 0.88 04/07/2021   CREATININE 0.89 03/14/2020   CREATININE 0.86 12/17/2018   Lab Results  Component Value Date   AST 36 (H) 11/15/2021   AST 40 04/07/2021   AST 33 03/14/2020   ALT 32 (H)  11/15/2021   ALT 40 04/07/2021   ALT 32 03/14/2020    Assessment: Sally Reid is here for Hepatitis C follow up. She started [place] about 1 month ago. She is experiencing some mild fatigue and diarrhea and switched to taking the medication at night due to this. She states the diarrhea is not too bad and does not wish to take anything OTC such as imodium. No nausea, vomiting, or headaches. She has missed 1 dose due to forgetting one night. Cautioned on missing any more doses as this could reduce her chance of becoming cured. She has already received her 2nd box. Reminded her that it is 3 months total. She was recently prescribed Tramadol and did not want to take any until she asked if there were any drug interaction. None detected so I told her it was safe to take. Answered all  questions. Will check labs today and have her see Dr. Renold Don when she has completed treatment.  Plan: - Continue daily Epclusa for 12 weeks - Hep C RNA and CMP today - F/u with Dr. Renold Don  on 10/11  Zemirah Krasinski L. Sanford Lindblad, PharmD, BCIDP, AAHIVP, CPP Clinical Pharmacist Practitioner Infectious Diseases Clinical Pharmacist [facility] 01/03/2022, 2:39 PM

## 2022-01-05 LAB — COMPREHENSIVE METABOLIC PANEL
AG Ratio: 1.1 (calc) (ref 1.0–2.5)
ALT: 7 U/L (ref 6–29)
AST: 11 U/L (ref 10–35)
Albumin: 3.6 g/dL (ref 3.6–5.1)
Alkaline phosphatase (APISO): 48 U/L (ref 37–153)
BUN: 13 mg/dL (ref 7–25)
CO2: 29 mmol/L (ref 20–32)
Calcium: 9.5 mg/dL (ref 8.6–10.4)
Chloride: 103 mmol/L (ref 98–110)
Creat: 0.78 mg/dL (ref 0.50–1.05)
Globulin: 3.3 g/dL (calc) (ref 1.9–3.7)
Glucose, Bld: 88 mg/dL (ref 65–99)
Potassium: 3.5 mmol/L (ref 3.5–5.3)
Sodium: 139 mmol/L (ref 135–146)
Total Bilirubin: 0.4 mg/dL (ref 0.2–1.2)
Total Protein: 6.9 g/dL (ref 6.1–8.1)

## 2022-01-05 LAB — HEPATITIS C RNA QUANTITATIVE
HCV Quantitative Log: 1.42 log IU/mL — ABNORMAL HIGH
HCV RNA, PCR, QN: 26 IU/mL — ABNORMAL HIGH

## 2022-01-10 ENCOUNTER — Ambulatory Visit
Admission: EM | Admit: 2022-01-10 | Discharge: 2022-01-10 | Disposition: A | Discharge status: Home / Self Care | Payer: Commercial Managed Care - HMO | Attending: Internal Medicine | Admitting: Internal Medicine

## 2022-01-10 ENCOUNTER — Ambulatory Visit: Payer: Self-pay | Admitting: *Deleted

## 2022-01-10 ENCOUNTER — Ambulatory Visit
Admission: RE | Admit: 2022-01-10 | Discharge: 2022-01-10 | Disposition: A | Discharge status: Home / Self Care | Payer: Commercial Managed Care - HMO | Source: Ambulatory Visit | Attending: Orthopedic Surgery | Admitting: Orthopedic Surgery

## 2022-01-10 ENCOUNTER — Ambulatory Visit (INDEPENDENT_AMBULATORY_CARE_PROVIDER_SITE_OTHER): Payer: Commercial Managed Care - HMO

## 2022-01-10 ENCOUNTER — Encounter: Payer: Commercial Managed Care - HMO | Admitting: Rehabilitative and Restorative Service Providers"

## 2022-01-10 DIAGNOSIS — R053 Chronic cough: Secondary | ICD-10-CM

## 2022-01-10 DIAGNOSIS — R0602 Shortness of breath: Secondary | ICD-10-CM

## 2022-01-10 DIAGNOSIS — R059 Cough, unspecified: Secondary | ICD-10-CM | POA: Diagnosis not present

## 2022-01-10 DIAGNOSIS — M5116 Intervertebral disc disorders with radiculopathy, lumbar region: Secondary | ICD-10-CM

## 2022-01-10 MED ORDER — ALBUTEROL SULFATE HFA 108 (90 BASE) MCG/ACT IN AERS: 1.0000 | INHALATION_SPRAY | Freq: Four times a day (QID) | RESPIRATORY_TRACT | 0 refills | Status: DC | PRN

## 2022-01-10 MED ORDER — BENZONATATE 100 MG PO CAPS: 100.0000 mg | ORAL_CAPSULE | Freq: Three times a day (TID) | ORAL | 0 refills | Status: DC | PRN

## 2022-01-10 MED ORDER — DOXYCYCLINE HYCLATE 100 MG PO CAPS: 100.0000 mg | ORAL_CAPSULE | Freq: Two times a day (BID) | ORAL | 0 refills | Status: DC

## 2022-01-10 NOTE — Telephone Encounter (Signed)
  Chief Complaint: shortness of breath at rest cough Symptoms: cough shortness of breath . Nonproductive cough , chest pain with cough Frequency: 2 weeks  Pertinent Negatives: Patient denies fever, no difficulty breathing no dizziness sweating pain in jaw or arm  Disposition: [] ED /[x] Urgent Care (no appt availability in office) / [] Appointment(In office/virtual)/ []  [facility]/ [] Home Care/ [] Refused Recommended Disposition /[] [facility]/ []  Follow-up with PCP Additional Notes:   No available appt. No available at [facility]. Recommended UC/ ED.    Reason for Disposition  [1] MILD difficulty breathing (e.g., minimal/no SOB at rest, SOB with walking, pulse <100) AND [2] still present when not coughing  Answer Assessment - Initial Assessment Questions 1. ONSET: "When did the cough begin?"      2 weeks ago  2. SEVERITY: "How bad is the cough today?"      na 3. SPUTUM: "Describe the color of your sputum" (none, dry cough; clear, white, yellow, green)     None  4. HEMOPTYSIS: "Are you coughing up any blood?" If so ask: "How much?" (flecks, streaks, tablespoons, etc.)     na 5. DIFFICULTY BREATHING: "Are you having difficulty breathing?" If Yes, ask: "How bad is it?" (e.g., mild, moderate, severe)    - MILD: No SOB at rest, mild SOB with walking, speaks normally in sentences, can lie down, no retractions, pulse < 100.    - MODERATE: SOB at rest, SOB with minimal exertion and prefers to sit, cannot lie down flat, speaks in phrases, mild retractions, audible wheezing, pulse 100-120.    - SEVERE: Very SOB at rest, speaks in single words, struggling to breathe, sitting hunched forward, retractions, pulse > 120      Moderate cough and SOB with coughing  6. FEVER: "Do you have a fever?" If Yes, ask: "What is your temperature, how was it measured, and when did it start?"     na 7. CARDIAC HISTORY: "Do you have any history of heart disease?" (e.g., heart attack, congestive heart  failure)      na 8. LUNG HISTORY: "Do you have any history of lung disease?"  (e.g., pulmonary embolus, asthma, emphysema)     na 9. PE RISK FACTORS: "Do you have a history of blood clots?" (or: recent major surgery, recent prolonged travel, bedridden)     na 10. OTHER SYMPTOMS: "Do you have any other symptoms?" (e.g., runny nose, wheezing, chest pain)       Chest pain with coughing and shortness of breathing  11. PREGNANCY: "Is there any chance you are pregnant?" "When was your last menstrual period?"       na 12. TRAVEL: "Have you traveled out of the country in the last month?" (e.g., travel history, exposures)       na  Protocols used: Cough - Acute Non-Productive-A-AH

## 2022-01-10 NOTE — ED Triage Notes (Signed)
Pt c/o cough w/ associated SOB when/after coughing. Onset a few weeks ago.

## 2022-01-10 NOTE — Discharge Instructions (Addendum)
Your chest x-ray was normal.  You have been prescribed 3 medications to help alleviate symptoms including an inhaler to help alleviate your shortness of breath.  Please go to the emergency department if symptoms persist or worsen.

## 2022-01-10 NOTE — ED Provider Notes (Signed)
[name]    CSN: [id number] Arrival date & time: 01/10/22  1515      History   Chief Complaint Chief Complaint  Patient presents with   Cough    HPI Sally Reid is a 61 y.o. female.   Patient presents with cough that has been present for about 2 weeks.  Patient reports she also has intermittent shortness of breath with it.  Denies any associated upper respiratory symptoms, fever, chest pain, nausea, vomiting, diarrhea, abdominal pain, lower extremity swelling.  Denies any known sick contacts.  Denies any documented history of COPD or asthma but patient reports that she does smoke cigarettes on a daily basis.   Cough   Past Medical History:  Diagnosis Date   Arthritis    Hypertension     Patient Active Problem List   Diagnosis Date Noted   Chronic hepatitis C without hepatic coma (HCC) 04/21/2021   Left leg pain 04/18/2021   History of ankle fracture 04/18/2021   Tobacco use 04/18/2021   Essential hypertension 03/23/2020   Fatty liver 03/23/2020   Morbid obesity with BMI of 45.0-49.9, adult (HCC) 03/23/2020   Multiple thyroid nodules 07/14/2018    Past Surgical History:  Procedure Laterality Date   ABDOMINAL HYSTERECTOMY     2005   ANKLE FRACTURE SURGERY Left    arthroscopy of left knee  2008   for cartilage surgery   CESAREAN SECTION     2 previous   LEG SURGERY      OB History     Gravida  3   Para  2   Term  2   Preterm      AB  1   Living  2      SAB      IAB  1   Ectopic      Multiple      Live Births               Home Medications    Prior to Admission medications   Medication Sig Start Date End Date Taking? Authorizing Provider  albuterol (VENTOLIN HFA) 108 (90 Base) MCG/ACT inhaler Inhale 1-2 puffs into the lungs every 6 (six) hours as needed for wheezing or shortness of breath. 01/10/22  Yes Helina Hullum, Rolly Salter E, FNP  benzonatate (TESSALON) 100 MG capsule Take 1 capsule (100 mg total) by mouth every 8  (eight) hours as needed for cough. 01/10/22  Yes Karl Knarr, Rolly Salter E, FNP  doxycycline (VIBRAMYCIN) 100 MG capsule Take 1 capsule (100 mg total) by mouth 2 (two) times daily. 01/10/22  Yes Coby Shrewsberry, Acie Fredrickson, FNP  Apple Cider Vinegar 500 MG TABS Take 1 each by mouth.    [provider]  chlorthalidone (HYGROTON) 25 MG tablet Take 1 tablet (25 mg total) by mouth daily. 07/17/21   Storm Frisk, MD  diclofenac Sodium (VOLTAREN) 1 % GEL Apply 2 g topically 4 (four) times daily. To left ankle 04/18/21   Storm Frisk, MD  Sofosbuvir-Velpatasvir (EPCLUSA) 400-100 MG TABS Take 1 tablet by mouth daily. 11/27/21   Jennette Kettle, RPH-CPP  traMADol (ULTRAM) 50 MG tablet Take 1 tablet (50 mg total) by mouth every 6 (six) hours as needed for moderate pain. 01/01/22   Nadara Mustard, MD    Family History Family History  Problem Relation Age of Onset   Diabetes Mother    Hypertension Mother    Lymphoma Brother     Social History Social History   Tobacco  Use   Smoking status: Some Days    Packs/day: 0.25    Types: Cigarettes   Smokeless tobacco: Never  Vaping Use   Vaping Use: Never used  Substance Use Topics   Alcohol use: Yes    Comment: occassionally   Drug use: No     Allergies   Patient has no known allergies.   Review of Systems Review of Systems Per HPI  Physical Exam Triage Vital Signs ED Triage Vitals [01/10/22 1516]  Enc Vitals Group     BP (!) 150/80     Pulse Rate 80     Resp 18     Temp 98 F (36.7 C)     Temp Source Oral     SpO2 98 %     Weight      Height      Head Circumference      Peak Flow      Pain Score 0     Pain Loc      Pain Edu?      Excl. in GC?    No data found.  Updated Vital Signs BP (!) 150/80 (BP Location: Left Arm)   Pulse 80   Temp 98 F (36.7 C) (Oral)   Resp 18   SpO2 98%   Visual Acuity Right Eye Distance:   Left Eye Distance:   Bilateral Distance:    Right Eye Near:   Left Eye Near:    Bilateral Near:      Physical Exam Constitutional:      General: She is not in acute distress.    Appearance: Normal appearance. She is not toxic-appearing or diaphoretic.  HENT:     Head: Normocephalic and atraumatic.     Right Ear: Tympanic membrane and ear canal normal.     Left Ear: Tympanic membrane and ear canal normal.     Nose: No congestion.     Mouth/Throat:     Mouth: Mucous membranes are moist.     Pharynx: No posterior oropharyngeal erythema.  Eyes:     Extraocular Movements: Extraocular movements intact.     Conjunctiva/sclera: Conjunctivae normal.     Pupils: Pupils are equal, round, and reactive to light.  Cardiovascular:     Rate and Rhythm: Normal rate and regular rhythm.     Pulses: Normal pulses.     Heart sounds: Normal heart sounds.  Pulmonary:     Effort: Pulmonary effort is normal. No respiratory distress.     Breath sounds: Normal breath sounds. No stridor. No wheezing, rhonchi or rales.  Abdominal:     General: Abdomen is flat. Bowel sounds are normal.     Palpations: Abdomen is soft.  Musculoskeletal:        General: Normal range of motion.     Cervical back: Normal range of motion.  Skin:    General: Skin is warm and dry.  Neurological:     General: No focal deficit present.     Mental Status: She is alert and oriented to person, place, and time. Mental status is at baseline.  Psychiatric:        Mood and Affect: Mood normal.        Behavior: Behavior normal.      UC Treatments / Results  Labs (all labs ordered are listed, but only abnormal results are displayed) Labs Reviewed - No data to display  EKG   Radiology DG Chest 2 View  Result Date: 01/10/2022 CLINICAL DATA:  Shortness of  breath.  Cough EXAM: CHEST - 2 VIEW COMPARISON:  04/07/2021 FINDINGS: Heart size is normal. Mediastinal shadows are normal. The lungs are clear. No bronchial thickening. No infiltrate, mass, effusion or collapse. Pulmonary vascularity is normal. No bony abnormality.  IMPRESSION: Normal chest Electronically Signed   By: Paulina Fusi M.D.   On: 01/10/2022 15:50   MR Lumbar Spine w/o contrast  Result Date: 01/10/2022 CLINICAL DATA:  Low back pain, symptoms persist with greater than 6 weeks treatment. Additional history provided by scanning technologist: Patient reports low back pain, left leg pain. EXAM: MRI LUMBAR SPINE WITHOUT CONTRAST TECHNIQUE: Multiplanar, multisequence MR imaging of the lumbar spine was performed. No intravenous contrast was administered. COMPARISON:  Radiographs of the lumbar spine 08/21/2021 (images available, report unavailable). FINDINGS: Segmentation: 5 lumbar vertebrae. The caudal most well-formed into the vol disc space is designated L5-S1. Alignment: Slight grade 1 retrolisthesis at L2-L3 and L3-L4. Slight grade 1 anterolisthesis at L4-L5 and L5-S1. Vertebrae: Vertebral body height is maintained. Mild marrow edema within the posterior elements bilaterally at L4-L5 and L5-S1, likely degenerative and related to facet arthrosis. Minimal degenerative endplate edema at F64-P32, T11-T12, T12-L1 and L5-S1. Conus medullaris and cauda equina: Conus extends to the L1-L2 level. No signal abnormality identified within the visualized distal spinal cord. Paraspinal and other soft tissues: No abnormality identified within included portions of the abdomen/retroperitoneum. No paraspinal mass or collection. Disc levels: No more than mild disc degeneration. T10-T11: This level is imaged in the sagittal plane only. Slight disc bulge. No significant spinal canal or foraminal stenosis. T11-T12: This level is imaged in the sagittal plane only. Small central disc protrusion. Mild effacement of ventral thecal sac (without spinal cord mass effect). No significant foraminal stenosis. T12-L1: 10 mm central/right subarticular disc extrusion. The disc extrusion focally effaces the ventral thecal sac and likely contacts the ventral aspect of the spinal cord. The dorsal CSF space  is maintained within the spinal canal. No significant foraminal stenosis. L1-L2: Central posterior annular fissure. Small disc bulge. No significant spinal canal or foraminal stenosis. L2-L3: Slight grade 1 retrolisthesis. Small disc bulge. No significant spinal canal or foraminal stenosis. L3-L4: Slight grade 1 retrolisthesis. Small disc bulge. No significant spinal canal or foraminal stenosis. L4-L5: Slight grade 1 anterolisthesis. Slight disc uncovering with disc bulge. Mild to moderate facet arthrosis with ligamentum flavum hypertrophy. No significant spinal canal stenosis. Mild relative bilateral neural foraminal narrowing. L5-S1: Slight grade 1 anterolisthesis. Slight disc uncovering with disc bulge. Moderate facet arthrosis with ligamentum flavum hypertrophy. No significant spinal canal stenosis. Mild relative bilateral neural foraminal narrowing. 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 At T12-L1, there is a 10 mm central/right subarticular disc extrusion which focally effaces the ventral thecal sac and likely contacts the ventral aspect of the spinal cord. At L4-L5, there is slight grade 1 anterolisthesis. Slight disc uncovering with disc bulge. Mild-to-moderate facet arthrosis with ligamentum flavum hypertrophy. No significant spinal canal stenosis. Mild relative bilateral neural foraminal narrowing. At L5-S1, there is slight grade 1 anterolisthesis. Slight disc uncovering with disc bulge. Moderate facet arthrosis with ligamentum flavum hypertrophy. No significant spinal canal stenosis. Mild relative bilateral neural foraminal narrowing. Mild marrow edema within the posterior elements bilaterally at L4-L5 and L5-S1, likely degenerative and related to facet arthrosis. Electronically Signed   By: Jackey Loge D.O.   On: 01/10/2022 14:39    Procedures Procedures (including critical care time)  Medications Ordered in  UC Medications - No data to display  Initial Impression / Assessment and Plan / UC Course  I have reviewed the triage vital signs and the nursing notes.  Pertinent labs & imaging results that were available during my care of the patient were reviewed by me and considered in my medical decision making (see chart for details).     Chest x-ray was negative for any acute cardiopulmonary process.  Suspect possible viral cause to patient's symptoms.  Will treat with doxycycline to cover for atypicals especially given patient is a smoker.  Albuterol inhaler prescribed to take as needed for shortness of breath.  No current signs of tachypnea on exam and oxygen is normal.  Benzonatate prescribed take as needed for cough.  Advised patient to go to the ER if symptoms persist or worsen.  Patient is safe for discharge.  Patient verbalized understanding and was agreeable with plan. Final Clinical Impressions(s) / UC Diagnoses   Final diagnoses:  Persistent cough  Shortness of breath     Discharge Instructions      Your chest x-ray was normal.  You have been prescribed 3 medications to help alleviate symptoms including an inhaler to help alleviate your shortness of breath.  Please go to the emergency department if symptoms persist or worsen.    ED Prescriptions     Medication Sig Dispense Auth. Provider   doxycycline (VIBRAMYCIN) 100 MG capsule Take 1 capsule (100 mg total) by mouth 2 (two) times daily. 20 capsule [place], [place], [place]   benzonatate (TESSALON) 100 MG capsule Take 1 capsule (100 mg total) by mouth every 8 (eight) hours as needed for cough. 21 capsule [place], [place], [place]   albuterol (VENTOLIN HFA) 108 (90 Base) MCG/ACT inhaler Inhale 1-2 puffs into the lungs every 6 (six) hours as needed for wheezing or shortness of breath. 1 each [place], [place]      PDMP not reviewed this encounter.   G[place], [place] 01/10/22 [phone]

## 2022-01-11 ENCOUNTER — Other Ambulatory Visit: Payer: Commercial Managed Care - HMO

## 2022-01-24 ENCOUNTER — Other Ambulatory Visit (HOSPITAL_COMMUNITY): Payer: Self-pay

## 2022-01-25 ENCOUNTER — Other Ambulatory Visit (HOSPITAL_COMMUNITY): Payer: Self-pay

## 2022-01-31 ENCOUNTER — Other Ambulatory Visit: Payer: Self-pay | Admitting: Family

## 2022-01-31 ENCOUNTER — Ambulatory Visit: Payer: Managed Care, Other (non HMO) | Admitting: Critical Care Medicine

## 2022-01-31 DIAGNOSIS — M5116 Intervertebral disc disorders with radiculopathy, lumbar region: Secondary | ICD-10-CM

## 2022-02-01 ENCOUNTER — Ambulatory Visit: Payer: Commercial Managed Care - HMO | Admitting: Orthopedic Surgery

## 2022-02-14 ENCOUNTER — Encounter: Payer: Self-pay | Admitting: Physical Medicine and Rehabilitation

## 2022-02-14 ENCOUNTER — Ambulatory Visit (INDEPENDENT_AMBULATORY_CARE_PROVIDER_SITE_OTHER): Payer: Commercial Managed Care - HMO | Admitting: Physical Medicine and Rehabilitation

## 2022-02-14 DIAGNOSIS — M5116 Intervertebral disc disorders with radiculopathy, lumbar region: Secondary | ICD-10-CM

## 2022-02-14 DIAGNOSIS — M5416 Radiculopathy, lumbar region: Secondary | ICD-10-CM

## 2022-02-14 DIAGNOSIS — M4726 Other spondylosis with radiculopathy, lumbar region: Secondary | ICD-10-CM | POA: Diagnosis not present

## 2022-02-14 DIAGNOSIS — M47816 Spondylosis without myelopathy or radiculopathy, lumbar region: Secondary | ICD-10-CM | POA: Diagnosis not present

## 2022-02-14 MED ORDER — DIAZEPAM 5 MG PO TABS: ORAL_TABLET | ORAL | 0 refills | Status: DC

## 2022-02-14 NOTE — Progress Notes (Signed)
Sally Reid - 61 y.o. female MRN [id number]  Date of birth: 1960-09-10  Office Visit Note: Visit Date: 02/14/2022 PCP: Storm Frisk, MD Referred by: Storm Frisk, MD  Subjective: Chief Complaint  Patient presents with   Lower Back - Pain    Bilateral lower back pain, left sided radicular symptoms, ongoing for several months.   HPI: Sally Reid is a 61 y.o. female who comes in today per the request of Barnie Del, NP for evaluation of chronic, worsening and severe bilateral lower back pain radiating to the left hip/buttock and down lateral thigh to knee.  Pain ongoing for several months and is exacerbated by movement and activity, she describes her pain as sharp, aching and sore sensation, currently rates a 7 out of 10.  Some relief of pain with home exercise regimen, rest and use of medications.  Patient has tried Tylenol and Prednisone in the past with short-lived pain relief.  Patient recently finished a regimen of formal physical therapy with our in-house team and reports minimal relief of pain with these treatments.  Recent lumbar MRI imaging exhibits multilevel facet arthropathy, multilevel grade 1 listhesis and 10 mm central/right disc protrusion at T12-L1 possibly contacting the ventral aspect of the spinal cord.  There is no high-grade spinal canal stenosis noted.  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  States her severe pain is making it difficult to complete job duties.  Patient denies focal weakness, numbness and tingling.  Patient denies recent trauma or falls.  Patient reports history of left ankle fracture with repair many years ago.  Dr. Aldean Baker has been managing this patient for several months, she recently received a steroid injection to left ankle and reports headache for several days post injection.    Review of Systems  Musculoskeletal:  Positive for back pain.  Neurological:  Negative for  tingling, sensory change, focal weakness and weakness.  All other systems reviewed and are negative.  Otherwise per HPI.  Assessment & Plan: Visit Diagnoses:    ICD-10-CM   1. Lumbar radiculopathy  M54.16 Ambulatory referral to Physical Medicine Rehab    2. Intervertebral disc disorders with radiculopathy, lumbar region  M51.16 Ambulatory referral to Physical Medicine Rehab    3. Other spondylosis with radiculopathy, lumbar region  M47.26 Ambulatory referral to Physical Medicine Rehab    4. Facet arthropathy, lumbar  M47.816 Ambulatory referral to Physical Medicine Rehab       Plan: Findings:  Chronic, worsening and severe bilateral lower back pain radiating to left hip/buttock and down left lateral thigh to knee.  Patient continues to have severe pain despite good conservative therapy such as formal physical therapy, home exercise regimen, rest and use of medications.  I did discuss lumbar MRI findings with patient today using imaging and spine model.  Patient's clinical presentation and exam do not directly correlate with recent lumbar MRI findings however her symptoms do fit more with an L5 nerve pattern.  Next step is to perform diagnostic and hopefully therapeutic left L5-S1 interlaminar epidural steroid injection under fluoroscopic guidance.  Patient is not currently taking anticoagulant medications.  Lumbar injection procedure discussed in detail today, I did provide patient with educational literature to take home and review.  Patient does voice anxiety related to procedure. I did prescribe pre procedure sedation with Valium for her to take on the day of injection.  If patient gets significant and lasting relief of pain with  injection we did discuss the possibility of repeating this procedure infrequently as needed.  If her pain persists postinjection we also discussed the possibility of surgical consult.  No red flag symptoms noted upon exam today.    Meds & Orders:  Meds ordered this  encounter  Medications   diazepam (VALIUM) 5 MG tablet    Sig: Take one tablet by mouth with food one hour prior to procedure. May repeat 30 minutes prior if needed.    Dispense:  2 tablet    Refill:  0    Orders Placed This Encounter  Procedures   Ambulatory referral to Physical Medicine Rehab    Follow-up: Return for Left L5-S1 interlaminar epidural steroid injection.   Procedures: No procedures performed      Clinical History: EXAM: MRI LUMBAR SPINE WITHOUT CONTRAST   TECHNIQUE: Multiplanar, multisequence MR imaging of the lumbar spine was performed. No intravenous contrast was administered.   COMPARISON:  Radiographs of the lumbar spine 08/21/2021 (images available, report unavailable).   FINDINGS: Segmentation: 5 lumbar vertebrae. The caudal most well-formed into the vol disc space is designated L5-S1.   Alignment: Slight grade 1 retrolisthesis at L2-L3 and L3-L4. Slight grade 1 anterolisthesis at L4-L5 and L5-S1.   Vertebrae: Vertebral body height is maintained. Mild marrow edema within the posterior elements bilaterally at L4-L5 and L5-S1, likely degenerative and related to facet arthrosis. Minimal degenerative endplate edema at V37-T06, T11-T12, T12-L1 and L5-S1.   Conus medullaris and cauda equina: Conus extends to the L1-L2 level. No signal abnormality identified within the visualized distal spinal cord.   Paraspinal and other soft tissues: No abnormality identified within included portions of the abdomen/retroperitoneum. No paraspinal mass or collection.   Disc levels:   No more than mild disc degeneration.   T10-T11: This level is imaged in the sagittal plane only. Slight disc bulge. No significant spinal canal or foraminal stenosis.   T11-T12: This level is imaged in the sagittal plane only. Small central disc protrusion. Mild effacement of ventral thecal sac (without spinal cord mass effect). No significant foraminal stenosis.   T12-L1: 10 mm  central/right subarticular disc extrusion. The disc extrusion focally effaces the ventral thecal sac and likely contacts the ventral aspect of the spinal cord. The dorsal CSF space is maintained within the spinal canal. No significant foraminal stenosis.   L1-L2: Central posterior annular fissure. Small disc bulge. No significant spinal canal or foraminal stenosis.   L2-L3: Slight grade 1 retrolisthesis. Small disc bulge. No significant spinal canal or foraminal stenosis.   L3-L4: Slight grade 1 retrolisthesis. Small disc bulge. No significant spinal canal or foraminal stenosis.   L4-L5: Slight grade 1 anterolisthesis. Slight disc uncovering with disc bulge. Mild to moderate facet arthrosis with ligamentum flavum hypertrophy. No significant spinal canal stenosis. Mild relative bilateral neural foraminal narrowing.   L5-S1: Slight grade 1 anterolisthesis. Slight disc uncovering with disc bulge. Moderate facet arthrosis with ligamentum flavum hypertrophy. No significant spinal canal stenosis. Mild relative bilateral neural foraminal narrowing.   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   At T12-L1, there is a 10 mm central/right subarticular disc extrusion which focally effaces the ventral thecal sac and likely contacts the ventral aspect of the spinal cord.   At L4-L5, there is slight grade 1 anterolisthesis. Slight disc uncovering with disc bulge. Mild-to-moderate facet arthrosis with ligamentum flavum hypertrophy. No significant spinal canal stenosis. Mild relative bilateral neural foraminal narrowing.  At L5-S1, there is slight grade 1 anterolisthesis. Slight disc uncovering with disc bulge. Moderate facet arthrosis with ligamentum flavum hypertrophy. No significant spinal canal stenosis. Mild relative bilateral neural foraminal narrowing.   Mild marrow edema within the posterior  elements bilaterally at L4-L5 and L5-S1, likely degenerative and related to facet arthrosis.     Electronically Signed   By: Jackey Loge D.O.   On: 01/10/2022 14:39   She reports that she has been smoking cigarettes. She has been smoking an average of .25 packs per day. She has never used smokeless tobacco. No results for input(s): "HGBA1C", "LABURIC" in the last 8760 hours.  Objective:  VS:  HT:    WT:   BMI:     BP:   HR: bpm  TEMP: ( )  RESP:  Physical Exam Vitals and nursing note reviewed.  HENT:     Head: Normocephalic and atraumatic.     Right Ear: External ear normal.     Left Ear: External ear normal.     Nose: Nose normal.     Mouth/Throat:     Mouth: Mucous membranes are moist.  Eyes:     Extraocular Movements: Extraocular movements intact.  Cardiovascular:     Rate and Rhythm: Normal rate.     Pulses: Normal pulses.  Pulmonary:     Effort: Pulmonary effort is normal.  Abdominal:     General: Abdomen is flat. There is no distension.  Musculoskeletal:        General: Tenderness present.     Cervical back: Normal range of motion.     Comments: Pt rises from seated position to standing without difficulty. Good lumbar range of motion. Strong distal strength without clonus, no pain upon palpation of greater trochanters. Sensation intact bilaterally. Dysesthesias noted to left L5 dermatome. Walks independently, gait steady.   Skin:    General: Skin is warm and dry.     Capillary Refill: Capillary refill takes less than 2 seconds.  Neurological:     General: No focal deficit present.     Mental Status: She is alert and oriented to person, place, and time.  Psychiatric:        Mood and Affect: Mood normal.        Behavior: Behavior normal.     Ortho Exam  Imaging: No results found.  Past Medical/Family/Surgical/Social History: Medications & Allergies reviewed per EMR, new medications updated. Patient Active Problem List   Diagnosis Date Noted   Chronic  hepatitis C without hepatic coma (HCC) 04/21/2021   Left leg pain 04/18/2021   History of ankle fracture 04/18/2021   Tobacco use 04/18/2021   Essential hypertension 03/23/2020   Fatty liver 03/23/2020   Morbid obesity with BMI of 45.0-49.9, adult (HCC) 03/23/2020   Multiple thyroid nodules 07/14/2018   Past Medical History:  Diagnosis Date   Arthritis    Hypertension    Family History  Problem Relation Age of Onset   Diabetes Mother    Hypertension Mother    Lymphoma Brother    Past Surgical History:  Procedure Laterality Date   ABDOMINAL HYSTERECTOMY     2005   ANKLE FRACTURE SURGERY Left    arthroscopy of left knee  2008   for cartilage surgery   CESAREAN SECTION     2 previous   LEG SURGERY     Social History   Occupational History   Occupation: unemployed    Comment: without health insurance  Tobacco Use   Smoking  status: Some Days    Packs/day: 0.25    Types: Cigarettes   Smokeless tobacco: Never  Vaping Use   Vaping Use: Never used  Substance and Sexual Activity   Alcohol use: Yes    Comment: occassionally   Drug use: No   Sexual activity: Yes    Birth control/protection: Surgical

## 2022-02-19 ENCOUNTER — Other Ambulatory Visit (HOSPITAL_COMMUNITY): Payer: Self-pay

## 2022-02-28 ENCOUNTER — Encounter: Payer: Self-pay | Admitting: Critical Care Medicine

## 2022-02-28 ENCOUNTER — Encounter: Payer: Commercial Managed Care - HMO | Admitting: Physical Medicine and Rehabilitation

## 2022-02-28 ENCOUNTER — Ambulatory Visit: Payer: Commercial Managed Care - HMO | Attending: Critical Care Medicine | Admitting: Critical Care Medicine

## 2022-02-28 ENCOUNTER — Ambulatory Visit (INDEPENDENT_AMBULATORY_CARE_PROVIDER_SITE_OTHER): Payer: Commercial Managed Care - HMO | Admitting: Physical Medicine and Rehabilitation

## 2022-02-28 ENCOUNTER — Ambulatory Visit: Payer: Self-pay

## 2022-02-28 VITALS — BP 128/78 | HR 56 | Ht 59.0 in | Wt 233.2 lb

## 2022-02-28 VITALS — BP 139/79 | HR 74

## 2022-02-28 DIAGNOSIS — Z720 Tobacco use: Secondary | ICD-10-CM

## 2022-02-28 DIAGNOSIS — M5416 Radiculopathy, lumbar region: Secondary | ICD-10-CM | POA: Diagnosis not present

## 2022-02-28 DIAGNOSIS — Z23 Encounter for immunization: Secondary | ICD-10-CM

## 2022-02-28 DIAGNOSIS — I10 Essential (primary) hypertension: Secondary | ICD-10-CM

## 2022-02-28 MED ORDER — METHYLPREDNISOLONE ACETATE 80 MG/ML IJ SUSP
80.0000 mg | Freq: Once | INTRAMUSCULAR | Status: AC
  Administered 2022-02-28: 80 mg

## 2022-02-28 MED ORDER — CLOBETASOL PROPIONATE 0.05 % EX CREA: 1.0000 | TOPICAL_CREAM | Freq: Two times a day (BID) | CUTANEOUS | 1 refills | Status: DC

## 2022-02-28 MED ORDER — CHLORTHALIDONE 25 MG PO TABS: 25.0000 mg | ORAL_TABLET | Freq: Every day | ORAL | 4 refills | Status: DC

## 2022-02-28 NOTE — Assessment & Plan Note (Signed)
  .   Current smoking consumption amount: 3 to 5 cigarettes a day  . Dicsussion on advise to quit smoking and smoking impacts: Cardiovascular impacts  . Patient's willingness to quit: Willing to quit  . Methods to quit smoking discussed: Nicotine replacement  . Medication management of smoking session drugs discussed: Nicotine replacement lozenge  . Resources provided:  AVS   . Setting quit date not established  . Follow-up arranged 3 months   Time spent counseling the patient: 5 minutes

## 2022-02-28 NOTE — Progress Notes (Signed)
Numeric Pain Rating Scale and Functional Assessment Average Pain 7   In the last MONTH (on 0-10 scale) has pain interfered with the following?  1. General activity like being  able to carry out your everyday physical activities such as walking, climbing stairs, carrying groceries, or moving a chair?  Rating(8)   +Driver, -BT, -Dye Allergies.   Left sided sharp shooting pain that goes across lower back.

## 2022-02-28 NOTE — Progress Notes (Signed)
Established Patient Office Visit  Subjective:  Patient ID: Sally Reid, female    DOB: 10/08/1960  Age: 61 y.o. MRN: [id number]  CC: Primary care follow-up  HPI 07/17/21 Sally Reid presents for primary care follow-up and obesity and hypertension.  On arrival blood pressure is elevated 143/78.  Patient is on hydrochlorothiazide 25 mg daily.  She is skipping lunch eating [employer] drinks for breakfast and baked chicken and salads for dinner.  She now has insurance with [place].  She missed her orthopedic referral because of her ankle pain because she had a high co-pay.  She smokes 1 cigarette daily still.  She has restless legs at night and does sleeps very fitfully and is very tired fatigue during the day.  She is due up a Pap smear.  She also needs a shingles vaccine she agrees to receive the first 1 at this visit. /27  09/27/21 Patient seen in return follow-up and has been compliant with her blood pressure medication on arrival blood pressure is 136/82.  Patient states her left leg and lower back are improved but she has a follow-up MRI with orthopedics of the lower back.  Sleep study is yet to be performed.  She needs a Pap smear.  She is down to 1 cigarette daily.  She has no other complaints at this visit.  9/27 Patient seen in return follow-up she is down to 2 cigarettes a day she needs a Pap smear she complains of a rash on her left forearm.  She was in the ER recently for cough was given a course of antibiotics this is resolved.  Patient is going to orthopedics today to have her spine injected for chronic pain  There are no other complaints.  On arrival blood pressure is good 128/78. Past Medical History:  Diagnosis Date   Arthritis    Hypertension     Past Surgical History:  Procedure Laterality Date   ABDOMINAL HYSTERECTOMY     2005   ANKLE FRACTURE SURGERY Left    arthroscopy of left knee  2008   for cartilage surgery   CESAREAN SECTION     2 previous   LEG SURGERY       Family History  Problem Relation Age of Onset   Diabetes Mother    Hypertension Mother    Lymphoma Brother     Social History   Socioeconomic History   Marital status: Married    Spouse name: Not on file   Number of children: Not on file   Years of education: Not on file   Highest education level: Not on file  Occupational History   Occupation: unemployed    Comment: without health insurance  Tobacco Use   Smoking status: Some Days    Packs/day: 0.25    Types: Cigarettes   Smokeless tobacco: Never  Vaping Use   Vaping Use: Never used  Substance and Sexual Activity   Alcohol use: Yes    Comment: occassionally   Drug use: No   Sexual activity: Yes    Birth control/protection: Surgical  Other Topics Concern   Not on file  Social History Narrative   Lives in [place].   Had 2 children( two boys 71 and 29).   Living with fiancee.   Social Determinants of Health   Financial Resource Strain: Not on file  Food Insecurity: Not on file  Transportation Needs: Not on file  Physical Activity: Not on file  Stress: Not on file  Social Connections: Not on  file  Intimate Partner Violence: Not on file    Outpatient Medications Prior to Visit  Medication Sig Dispense Refill   albuterol (VENTOLIN HFA) 108 (90 Base) MCG/ACT inhaler Inhale 1-2 puffs into the lungs every 6 (six) hours as needed for wheezing or shortness of breath. 1 each 0   Apple Cider Vinegar 500 MG TABS Take 1 each by mouth.     diazepam (VALIUM) 5 MG tablet Take one tablet by mouth with food one hour prior to procedure. May repeat 30 minutes prior if needed. 2 tablet 0   diclofenac Sodium (VOLTAREN) 1 % GEL Apply 2 g topically 4 (four) times daily. To left ankle 50 g 1   Sofosbuvir-Velpatasvir (EPCLUSA) 400-100 MG TABS Take 1 tablet by mouth daily. 28 tablet 2   traMADol (ULTRAM) 50 MG tablet Take 1 tablet (50 mg total) by mouth every 6 (six) hours as needed for moderate pain. 30 tablet 0    chlorthalidone (HYGROTON) 25 MG tablet Take 1 tablet (25 mg total) by mouth daily. 60 tablet 4   benzonatate (TESSALON) 100 MG capsule Take 1 capsule (100 mg total) by mouth every 8 (eight) hours as needed for cough. (Patient not taking: Reported on 02/28/2022) 21 capsule 0   doxycycline (VIBRAMYCIN) 100 MG capsule Take 1 capsule (100 mg total) by mouth 2 (two) times daily. (Patient not taking: Reported on 02/28/2022) 20 capsule 0   No facility-administered medications prior to visit.    No Known Allergies  ROS Review of Systems  Constitutional: Negative.   HENT: Negative.  Negative for ear pain, postnasal drip, rhinorrhea, sinus pressure, sore throat, trouble swallowing and voice change.   Eyes: Negative.   Respiratory: Negative.  Negative for apnea, cough, choking, chest tightness, shortness of breath, wheezing and stridor.   Cardiovascular: Negative.  Negative for chest pain, palpitations and leg swelling.  Gastrointestinal: Negative.  Negative for abdominal distention, abdominal pain, nausea and vomiting.  Genitourinary: Negative.   Musculoskeletal:  Positive for back pain. Negative for arthralgias and myalgias.       Left lower ankle pain  Skin: Negative.  Negative for rash.  Allergic/Immunologic: Negative.  Negative for environmental allergies and food allergies.  Neurological: Negative.  Negative for dizziness, syncope, weakness and headaches.  Hematological: Negative.  Negative for adenopathy. Does not bruise/bleed easily.  Psychiatric/Behavioral: Negative.  Negative for agitation and sleep disturbance. The patient is not nervous/anxious.       Objective:    Physical Exam Vitals reviewed.  Constitutional:      Appearance: Normal appearance. She is well-developed. She is obese. She is not diaphoretic.  HENT:     Head: Normocephalic and atraumatic.     Nose: No nasal deformity, septal deviation, mucosal edema or rhinorrhea.     Right Sinus: No maxillary sinus tenderness or  frontal sinus tenderness.     Left Sinus: No maxillary sinus tenderness or frontal sinus tenderness.     Mouth/Throat:     Pharynx: No oropharyngeal exudate.  Eyes:     General: No scleral icterus.    Conjunctiva/sclera: Conjunctivae normal.     Pupils: Pupils are equal, round, and reactive to light.  Neck:     Thyroid: No thyromegaly.     Vascular: No carotid bruit or JVD.     Trachea: Trachea normal. No tracheal tenderness or tracheal deviation.  Cardiovascular:     Rate and Rhythm: Normal rate and regular rhythm.     Chest Wall: PMI is not displaced.  Pulses: Normal pulses. No decreased pulses.     Heart sounds: Normal heart sounds, S1 normal and S2 normal. Heart sounds not distant. No murmur heard.    No systolic murmur is present.     No diastolic murmur is present.     No friction rub. No gallop. No S3 or S4 sounds.  Pulmonary:     Effort: No tachypnea, accessory muscle usage or respiratory distress.     Breath sounds: No stridor. No decreased breath sounds, wheezing, rhonchi or rales.  Chest:     Chest wall: No tenderness.  Abdominal:     General: Bowel sounds are normal. There is no distension.     Palpations: Abdomen is soft. Abdomen is not rigid.     Tenderness: There is no abdominal tenderness. There is no guarding or rebound.  Musculoskeletal:        General: Tenderness present. Normal range of motion.     Cervical back: Normal range of motion and neck supple. No edema, erythema or rigidity. No muscular tenderness. Normal range of motion.     Comments: Lumbar spine tenderness  Lymphadenopathy:     Head:     Right side of head: No submental or submandibular adenopathy.     Left side of head: No submental or submandibular adenopathy.     Cervical: No cervical adenopathy.  Skin:    General: Skin is warm and dry.     Coloration: Skin is not pale.     Findings: No rash.     Nails: There is no clubbing.  Neurological:     Mental Status: She is alert and oriented  to person, place, and time.     Sensory: No sensory deficit.  Psychiatric:        Speech: Speech normal.        Behavior: Behavior normal.     BP 128/78   Pulse (!) 56   Ht 4' 11" (1.499 m)   Wt 233 lb 3.2 oz (105.8 kg)   SpO2 95%   BMI 47.10 kg/m  Wt Readings from Last 3 Encounters:  02/28/22 233 lb 3.2 oz (105.8 kg)  09/27/21 236 lb (107 kg)  07/17/21 240 lb 3.2 oz (109 kg)     Health Maintenance Due  Topic Date Due   PAP SMEAR-Modifier  08/07/2014   COVID-19 Vaccine (4 - Pfizer series) 05/17/2020    There are no preventive care reminders to display for this patient.  Lab Results  Component Value Date   TSH 0.42 08/25/2018   Lab Results  Component Value Date   WBC 6.9 11/15/2021   HGB 14.1 11/15/2021   HCT 40.9 11/15/2021   MCV 86.1 11/15/2021   PLT 273 11/15/2021   Lab Results  Component Value Date   NA 139 01/03/2022   K 3.5 01/03/2022   CO2 29 01/03/2022   GLUCOSE 88 01/03/2022   BUN 13 01/03/2022   CREATININE 0.78 01/03/2022   BILITOT 0.4 01/03/2022   ALKPHOS 75 04/07/2021   AST 11 01/03/2022   ALT 7 01/03/2022   PROT 6.9 01/03/2022   ALBUMIN 3.5 04/07/2021   CALCIUM 9.5 01/03/2022   ANIONGAP 8 04/07/2021   EGFR 81 09/27/2021   Lab Results  Component Value Date   CHOL  04/10/2009    152        ATP III CLASSIFICATION:  <200     mg/dL   Desirable  200-239  mg/dL   Borderline High  >=240  mg/dL   High          Lab Results  Component Value Date   HDL 53 04/10/2009   Lab Results  Component Value Date   LDLCALC  04/10/2009    81        Total Cholesterol/HDL:CHD Risk Coronary Heart Disease Risk Table                     Men   Women  1/2 Average Risk   3.4   3.3  Average Risk       5.0   4.4  2 X Average Risk   9.6   7.1  3 X Average Risk  23.4   11.0        Use the calculated Patient Ratio above and the CHD Risk Table to determine the patient's CHD Risk.        ATP III CLASSIFICATION (LDL):  <100     mg/dL   Optimal  100-129   mg/dL   Near or Above                    Optimal  130-159  mg/dL   Borderline  160-189  mg/dL   High  >190     mg/dL   Very High   Lab Results  Component Value Date   TRIG 90 04/10/2009   Lab Results  Component Value Date   CHOLHDL 2.9 04/10/2009   Lab Results  Component Value Date   HGBA1C  04/11/2009    5.2 (NOTE) The ADA recommends the following therapeutic goal for glycemic control related to Hgb A1c measurement: Goal of therapy: <6.5 Hgb A1c  Reference: American Diabetes Association: Clinical Practice Recommendations 2010, Diabetes Care, 2010, 33: (Suppl  1).      Assessment & Plan:   Problem List Items Addressed This Visit       Cardiovascular and Mediastinum   Essential hypertension    Blood pressure at goal no changes made      Relevant Medications   chlorthalidone (HYGROTON) 25 MG tablet     Other   Tobacco use       Current smoking consumption amount: 3 to 5 cigarettes a day  Dicsussion on advise to quit smoking and smoking impacts: Cardiovascular impacts  Patient's willingness to quit: Willing to quit  Methods to quit smoking discussed: Nicotine replacement  Medication management of smoking session drugs discussed: Nicotine replacement lozenge  Resources provided:  AVS   Setting quit date not established  Follow-up arranged 3 months   Time spent counseling the patient: 5 minutes       Other Visit Diagnoses     Need for immunization against influenza    -  Primary   Relevant Orders   Flu Vaccine QUAD 86moIM (Fluarix, Fluzone & Alfiuria Quad PF) (Completed)      Meds ordered this encounter  Medications   chlorthalidone (HYGROTON) 25 MG tablet    Sig: Take 1 tablet (25 mg total) by mouth daily.    Dispense:  90 tablet    Refill:  4   DISCONTD: clobetasol cream (TEMOVATE) 0.05 %    Sig: Apply 1 Application topically 2 (two) times daily. To affected area on right forearm    Dispense:  60 g    Refill:  1   clobetasol cream  (TEMOVATE) 0.05 %    Sig: Apply 1 Application topically 2 (two) times daily. To affected area on left forearm  Dispense:  60 g    Refill:  1  Pap smear will be obtained  Follow-up: Return in about 4 months (around 06/30/2022) for htn.    Asencion Noble, MD

## 2022-02-28 NOTE — Patient Instructions (Addendum)
Begin clobetasol cream to the forearm on the left  Stay on chlorthalidone daily  Obtain a dental exam to address the tooth decay  Pap smear appointment will be made with our PA  Flu vaccine was given  Keep orthopedic appointments and once your back feels better start walking 30 minutes 5 times a week  Return to Dr. Joya Gaskins 4 months

## 2022-02-28 NOTE — Patient Instructions (Signed)

## 2022-02-28 NOTE — Assessment & Plan Note (Signed)
Blood pressure at goal no changes made 

## 2022-03-02 NOTE — Progress Notes (Signed)
Sally Reid - 61 y.o. female MRN [id number]  Date of birth: April 20, 1961  Office Visit Note: Visit Date: 02/28/2022 PCP: Elsie Stain, MD Referred by: Lorine Bears, NP  Subjective: Chief Complaint  Patient presents with   Lower Back - Pain    Left side pain that travels across lower back   HPI:  Sally Reid is a 61 y.o. female who comes in today at the request of Barnet Pall, FNP for planned Left L5-S1 Lumbar Interlaminar epidural steroid injection with fluoroscopic guidance.  The patient has failed conservative care including home exercise, medications, time and activity modification.  This injection will be diagnostic and hopefully therapeutic.  Please see requesting physician notes for further details and justification.   ROS Otherwise per HPI.  Assessment & Plan: Visit Diagnoses:    ICD-10-CM   1. Lumbar radiculopathy  M54.16 XR C-ARM NO REPORT    Epidural Steroid injection    methylPREDNISolone acetate (DEPO-MEDROL) injection 80 mg      Plan: No additional findings.   Meds & Orders:  Meds ordered this encounter  Medications   methylPREDNISolone acetate (DEPO-MEDROL) injection 80 mg    Orders Placed This Encounter  Procedures   XR C-ARM NO REPORT   Epidural Steroid injection    Follow-up: Return for visit to requesting provider as needed.   Procedures: No procedures performed  Lumbar Epidural Steroid Injection - Interlaminar Approach with Fluoroscopic Guidance  [name]      Date of Birth: Feb 21, 1961 MRN: [id number] PCP: Elsie Stain, MD      Visit Date: 02/28/2022   Universal Protocol:     Consent Given By: the patient  Position: PRONE  Additional Comments: Vital signs were monitored before and after the procedure. Patient was prepped and draped in the usual sterile fashion. The correct patient, procedure, and site was verified.   Injection Procedure Details:   Procedure diagnoses: Lumbar radiculopathy [M54.16]    Meds Administered:  Meds ordered this encounter  Medications   methylPREDNISolone acetate (DEPO-MEDROL) injection 80 mg     Laterality: Left  Location/Site:  L5-S1  Needle: 4.5 in., 20 ga. Tuohy  Needle Placement: Paramedian epidural  Findings:   -Comments: Excellent flow of contrast into the epidural space.  Procedure Details: Using a paramedian approach from the side mentioned above, the region overlying the inferior lamina was localized under fluoroscopic visualization and the soft tissues overlying this structure were infiltrated with 4 ml. of 1% Lidocaine without Epinephrine. The Tuohy needle was inserted into the epidural space using a paramedian approach.   The epidural space was localized using loss of resistance along with counter oblique bi-planar fluoroscopic views.  After negative aspirate for air, blood, and CSF, a 2 ml. volume of Isovue-250 was injected into the epidural space and the flow of contrast was observed. Radiographs were obtained for documentation purposes.    The injectate was administered into the level noted above.   Additional Comments:  The patient tolerated the procedure well Dressing: 2 x 2 sterile gauze and Band-Aid    Post-procedure details: Patient was observed during the procedure. Post-procedure instructions were reviewed.  Patient left the clinic in stable condition.   Clinical History: EXAM: MRI LUMBAR SPINE WITHOUT CONTRAST   TECHNIQUE: Multiplanar, multisequence MR imaging of the lumbar spine was performed. No intravenous contrast was administered.   COMPARISON:  Radiographs of the lumbar spine 08/21/2021 (images available, report unavailable).   FINDINGS: Segmentation: 5 lumbar vertebrae. The caudal most well-formed into  the vol disc space is designated L5-S1.   Alignment: Slight grade 1 retrolisthesis at L2-L3 and L3-L4. Slight grade 1 anterolisthesis at L4-L5 and L5-S1.   Vertebrae: Vertebral body height is  maintained. Mild marrow edema within the posterior elements bilaterally at L4-L5 and L5-S1, likely degenerative and related to facet arthrosis. Minimal degenerative endplate edema at K53-Z76, T11-T12, T12-L1 and L5-S1.   Conus medullaris and cauda equina: Conus extends to the L1-L2 level. No signal abnormality identified within the visualized distal spinal cord.   Paraspinal and other soft tissues: No abnormality identified within included portions of the abdomen/retroperitoneum. No paraspinal mass or collection.   Disc levels:   No more than mild disc degeneration.   T10-T11: This level is imaged in the sagittal plane only. Slight disc bulge. No significant spinal canal or foraminal stenosis.   T11-T12: This level is imaged in the sagittal plane only. Small central disc protrusion. Mild effacement of ventral thecal sac (without spinal cord mass effect). No significant foraminal stenosis.   T12-L1: 10 mm central/right subarticular disc extrusion. The disc extrusion focally effaces the ventral thecal sac and likely contacts the ventral aspect of the spinal cord. The dorsal CSF space is maintained within the spinal canal. No significant foraminal stenosis.   L1-L2: Central posterior annular fissure. Small disc bulge. No significant spinal canal or foraminal stenosis.   L2-L3: Slight grade 1 retrolisthesis. Small disc bulge. No significant spinal canal or foraminal stenosis.   L3-L4: Slight grade 1 retrolisthesis. Small disc bulge. No significant spinal canal or foraminal stenosis.   L4-L5: Slight grade 1 anterolisthesis. Slight disc uncovering with disc bulge. Mild to moderate facet arthrosis with ligamentum flavum hypertrophy. No significant spinal canal stenosis. Mild relative bilateral neural foraminal narrowing.   L5-S1: Slight grade 1 anterolisthesis. Slight disc uncovering with disc bulge. Moderate facet arthrosis with ligamentum flavum hypertrophy. No significant  spinal canal stenosis. Mild relative bilateral neural foraminal narrowing.   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   At T12-L1, there is a 10 mm central/right subarticular disc extrusion which focally effaces the ventral thecal sac and likely contacts the ventral aspect of the spinal cord.   At L4-L5, there is slight grade 1 anterolisthesis. Slight disc uncovering with disc bulge. Mild-to-moderate facet arthrosis with ligamentum flavum hypertrophy. No significant spinal canal stenosis. Mild relative bilateral neural foraminal narrowing.   At L5-S1, there is slight grade 1 anterolisthesis. Slight disc uncovering with disc bulge. Moderate facet arthrosis with ligamentum flavum hypertrophy. No significant spinal canal stenosis. Mild relative bilateral neural foraminal narrowing.   Mild marrow edema within the posterior elements bilaterally at L4-L5 and L5-S1, likely degenerative and related to facet arthrosis.     Electronically Signed   By: Jackey Loge D.O.   On: 01/10/2022 14:39     Objective:  VS:  HT:    WT:   BMI:     BP:139/79  HR:74bpm  TEMP: ( )  RESP:  Physical Exam Vitals and nursing note reviewed.  Constitutional:      General: She is not in acute distress.    Appearance: Normal appearance. She is not ill-appearing.  HENT:     Head: Normocephalic and atraumatic.     Right Ear: External ear normal.     Left Ear: External ear normal.  Eyes:     Extraocular Movements: Extraocular movements intact.  Cardiovascular:     Rate and Rhythm: Normal rate.  Pulses: Normal pulses.  Pulmonary:     Effort: Pulmonary effort is normal. No respiratory distress.  Abdominal:     General: There is no distension.     Palpations: Abdomen is soft.  Musculoskeletal:        General: Tenderness present.     Cervical back: Neck supple.     Right lower leg: No edema.     Left lower  leg: No edema.     Comments: Patient has good distal strength with no pain over the greater trochanters.  No clonus or focal weakness.  Skin:    Findings: No erythema, lesion or rash.  Neurological:     General: No focal deficit present.     Mental Status: She is alert and oriented to person, place, and time.     Sensory: No sensory deficit.     Motor: No weakness or abnormal muscle tone.     Coordination: Coordination normal.  Psychiatric:        Mood and Affect: Mood normal.        Behavior: Behavior normal.      Imaging: No results found.

## 2022-03-02 NOTE — Procedures (Signed)
Lumbar Epidural Steroid Injection - Interlaminar Approach with Fluoroscopic Guidance  [name]      Date of Birth: April 07, 1961 MRN: [id number] PCP: Elsie Stain, MD      Visit Date: 02/28/2022   Universal Protocol:     Consent Given By: the patient  Position: PRONE  Additional Comments: Vital signs were monitored before and after the procedure. Patient was prepped and draped in the usual sterile fashion. The correct patient, procedure, and site was verified.   Injection Procedure Details:   Procedure diagnoses: Lumbar radiculopathy [M54.16]   Meds Administered:  Meds ordered this encounter  Medications   methylPREDNISolone acetate (DEPO-MEDROL) injection 80 mg     Laterality: Left  Location/Site:  L5-S1  Needle: 4.5 in., 20 ga. Tuohy  Needle Placement: Paramedian epidural  Findings:   -Comments: Excellent flow of contrast into the epidural space.  Procedure Details: Using a paramedian approach from the side mentioned above, the region overlying the inferior lamina was localized under fluoroscopic visualization and the soft tissues overlying this structure were infiltrated with 4 ml. of 1% Lidocaine without Epinephrine. The Tuohy needle was inserted into the epidural space using a paramedian approach.   The epidural space was localized using loss of resistance along with counter oblique bi-planar fluoroscopic views.  After negative aspirate for air, blood, and CSF, a 2 ml. volume of Isovue-250 was injected into the epidural space and the flow of contrast was observed. Radiographs were obtained for documentation purposes.    The injectate was administered into the level noted above.   Additional Comments:  The patient tolerated the procedure well Dressing: 2 x 2 sterile gauze and Band-Aid    Post-procedure details: Patient was observed during the procedure. Post-procedure instructions were reviewed.  Patient left the clinic in stable  condition.

## 2022-03-14 ENCOUNTER — Encounter: Payer: Self-pay | Admitting: Internal Medicine

## 2022-03-14 ENCOUNTER — Ambulatory Visit (INDEPENDENT_AMBULATORY_CARE_PROVIDER_SITE_OTHER): Payer: Commercial Managed Care - HMO | Admitting: Internal Medicine

## 2022-03-14 ENCOUNTER — Other Ambulatory Visit: Payer: Self-pay

## 2022-03-14 VITALS — BP 128/80 | HR 56 | Temp 98.4°F | Ht 59.0 in | Wt 231.0 lb

## 2022-03-14 DIAGNOSIS — B182 Chronic viral hepatitis C: Secondary | ICD-10-CM

## 2022-03-14 NOTE — Progress Notes (Signed)
[facility]  Reason for Consult:chronic hep c Referring Provider: Asencion Noble    Patient Active Problem List   Diagnosis Date Noted   Chronic hepatitis C without hepatic coma ([place]) 04/21/2021   Left leg pain 04/18/2021   History of ankle fracture 04/18/2021   Tobacco use 04/18/2021   Essential hypertension 03/23/2020   Fatty liver 03/23/2020   Morbid obesity with BMI of 45.0-49.9, adult ([place]) 03/23/2020   Multiple thyroid nodules 07/14/2018      HPI: Khamya Topp is a 61 y.o. female smoker, obese, orif in left ankle, ligament repair in left knee, referred by pcp for positive hep c  Patient just had a routine physical and hep c screening was done came back positive both antibody and viral load on 11/15  This is first time she heard she has hep c  Timing of diagnosis: Unknown; she works as a cna for 24 years as of 2022. Brother has some kind of hepatitis although he was cured after treatment. She doesn't know what kind of hepatitis (c or b brother had)   Risk: no hx ivdu, indu, kidney/dialysis issue, blood transfusion, tatoos, known exposure to closed co-habitant/relatives with hep c  Cirrhosis finding: no hx n/v/hematemesis, bloody/black stool, abd distension, fluid withdrawal from abd/lungs, hx confusion, fatigue, edema, weight loss, poor apetite  Extra gi manifestation: no abnormal skin rash, fatigue, diffuse joint pain/swelling, hx kidney disease    We reviewed: Natural history of hep c and its complications available treatment options for hepatitis C Other factors potentially worsening liver disease, including alcohol use; obesity; diabetes mellitus, and viral coinfection We discussed potential medications that can contribute to liver inflammation like acetaminophen, and to avoiding excessive amount (more than 3 gram daily use) acetaminophen    She takes a pill of tylenol 325 mg a day for her  osteoarthritis   ----------------------- 06/15/21 [facility] clinic f/u Reviewed labs/elastography She is vaccinated/responder to hep b No change in health Feels well No evidence cirrhosis based on labs/imaging/history previously   03/14/22 id clinic f/u Patient was given 12 weeks of epclusa starting 12/07/21. She finished the 3 months of medication. She didn't miss any dose. She finished about 2 weeks ago.  She has no complaint.   Meds: Outpatient Encounter Medications as of 03/14/2022  Medication Sig   Acetaminophen (TYLENOL ARTHRITIS PAIN PO) Take 1 tablet by mouth daily.   albuterol (VENTOLIN HFA) 108 (90 Base) MCG/ACT inhaler Inhale 1-2 puffs into the lungs every 6 (six) hours as needed for wheezing or shortness of breath.   Apple Cider Vinegar 500 MG TABS Take 1 each by mouth.   chlorthalidone (HYGROTON) 25 MG tablet Take 1 tablet (25 mg total) by mouth daily.   clobetasol cream (TEMOVATE) 7.82 % Apply 1 Application topically 2 (two) times daily. To affected area on left forearm   traMADol (ULTRAM) 50 MG tablet Take 1 tablet (50 mg total) by mouth every 6 (six) hours as needed for moderate pain.   diazepam (VALIUM) 5 MG tablet Take one tablet by mouth with food one hour prior to procedure. May repeat 30 minutes prior if needed. (Patient not taking: Reported on 03/14/2022)   diclofenac Sodium (VOLTAREN) 1 % GEL Apply 2 g topically 4 (four) times daily. To left ankle (Patient not taking: Reported on 03/14/2022)   Sofosbuvir-Velpatasvir (EPCLUSA) 400-100 MG TABS Take 1 tablet by mouth daily. (Patient not taking: Reported on 03/14/2022)   No facility-administered encounter medications  on file as of 03/14/2022.   No cholesterol/heart burn medication   Review of Systems: ROS All other ros negative      Past Medical History:  Diagnosis Date   Arthritis    Hypertension     Social History   Tobacco Use   Smoking status: Some Days    Packs/day: 0.10    Types: Cigarettes    Smokeless tobacco: Never   Tobacco comments:    Smokes 2 cigarettes/day - cutting back  Vaping Use   Vaping Use: Never used  Substance Use Topics   Alcohol use: Yes    Comment: occassionally   Drug use: No    Family History  Problem Relation Age of Onset   Diabetes Mother    Hypertension Mother    Lymphoma Brother     No Known Allergies  OBJECTIVE: Vitals:   03/14/22 1040  BP: 128/80  Pulse: (!) 56  Temp: 98.4 F (36.9 C)  TempSrc: Oral  SpO2: 100%  Weight: 231 lb (104.8 kg)  Height: 4\' 11"  (1.499 m)   Body mass index is 46.66 kg/m.   Physical Exam General/constitutional: no distress, pleasant HEENT: Normocephalic, PER, Conj Clear, EOMI, Oropharynx clear Neck supple CV: rrr no mrg Lungs: clear to auscultation, normal respiratory effort Abd: Soft, Nontender Ext: no edema Skin: No Rash Neuro: nonfocal MSK: no peripheral joint swelling/tenderness/warmth; back spines nontender    Lab: Lab Results  Component Value Date   WBC 6.9 11/15/2021   HGB 14.1 11/15/2021   HCT 40.9 11/15/2021   MCV 86.1 11/15/2021   PLT 273 11/15/2021   Last metabolic panel Lab Results  Component Value Date   GLUCOSE 88 01/03/2022   NA 139 01/03/2022   K 3.5 01/03/2022   CL 103 01/03/2022   CO2 29 01/03/2022   BUN 13 01/03/2022   CREATININE 0.78 01/03/2022   GFRNONAA >60 04/07/2021   CALCIUM 9.5 01/03/2022   PROT 6.9 01/03/2022   ALBUMIN 3.5 04/07/2021   BILITOT 0.4 01/03/2022   ALKPHOS 75 04/07/2021   AST 11 01/03/2022   ALT 7 01/03/2022   ANIONGAP 8 04/07/2021   No results found for: "AFP"  No results found for: "INR", "PROTIME"   Microbiology:  Serology:  Imaging: 05/2021 liver elastogrophy Median kPa:  3.4   Diagnostic category:  < or = 5 kPa: high probability of being normal      03/2020 ct abd pelv with contrast 1. No evidence of diverticulitis or other acute bowel pathology. 2. Fatty change of the liver. 3. Aortic atherosclerosis. 4. Facet  osteoarthritis at L4-5 and L5-S1 that could be symptomatic   11/2017 ruqus limited Liver:   No focal lesion identified. Within normal limits in parenchymal echogenicity. Portal vein is patent on color Doppler imaging with normal direction of blood flow towards the liver.   IMPRESSION: Negative right upper quadrant ultrasound.  Assessment/plan: Problem List Items Addressed This Visit       Digestive   Chronic hepatitis C without hepatic coma (HCC) - Primary   Relevant Orders   CBC   COMPLETE METABOLIC PANEL WITH GFR   Hepatitis C RNA quantitative    #chronic hep c  Prior treatment: no GT: 1a Evidence of cirrhosis: no based on history and elastography Interested in treatment Potential DDI: none  Duration of infection unknown. First dx 04/2021. No risk factors really outside of working as care provider. I advised her that roughly 3 in 10 patients can clear hep c on their own within  6-12 months. And given normal liver health and no really other risk for accelerated liver damage, I would recommend rechecking hep c serology in around 6 months. If this is negative (viral quantification), then I would say she has cleared it and not needing treatment  ------ Patient treated with 12 weeks epclusa end of 02/2022 Discussed test of cure is done at least 3 months from finishing medication  Discuss she can get reinfected again if risk remains. And that future testing is by hep c rna level, not antibody   -standing labs ordered hep c rna, cbc, cmp, for mid 06/2022 -no need for f/u; we can communicate via mychart if question. Looking for a negative viral load testing on hep c as test of cure   Follow-up: Return if symptoms worsen or fail to improve.  Raymondo Band, MD [facility] Infectious [facility] [phone] pager   [phone] cell 03/14/2022, 10:58 AM

## 2022-03-14 NOTE — Patient Instructions (Signed)
Please do your blood test any time after 06/11/2022  What we are looking for is a negative hepatitis c RNA level. This would tell you that your treatment was successful and you are cured   If this is a positive test, then we have to retreat with a different medication..  No need to follow up now. Will communicate via mychart

## 2022-03-30 ENCOUNTER — Ambulatory Visit
Admission: EM | Admit: 2022-03-30 | Discharge: 2022-03-30 | Disposition: A | Discharge status: Home / Self Care | Payer: Commercial Managed Care - HMO | Attending: Physician Assistant | Admitting: Physician Assistant

## 2022-03-30 DIAGNOSIS — J069 Acute upper respiratory infection, unspecified: Secondary | ICD-10-CM | POA: Diagnosis present

## 2022-03-30 DIAGNOSIS — Z1152 Encounter for screening for COVID-19: Secondary | ICD-10-CM | POA: Insufficient documentation

## 2022-03-30 LAB — POCT INFLUENZA A/B
Influenza A, POC: NEGATIVE
Influenza B, POC: NEGATIVE

## 2022-03-30 NOTE — ED Triage Notes (Signed)
Pt presents with non productive cough, congestion, sore throat, and headache X 2 days.

## 2022-03-30 NOTE — ED Provider Notes (Signed)
[name]    CSN: [id number] Arrival date & time: 03/30/22  0959      History   Chief Complaint Chief Complaint  Patient presents with   URI    HPI Galilea Quito is a 61 y.o. female.   Patient here today for evaluation of nonproductive cough, congestion and sore throat that started 2 days ago. She has also had associated headache. She does not report fever. She has been taking OTC meds without significant relief. She has not had any nausea, vomiting or diarrhea.   The history is provided by the patient.  URI Presenting symptoms: congestion, cough and sore throat   Presenting symptoms: no ear pain and no fever   Associated symptoms: headaches   Associated symptoms: no wheezing     Past Medical History:  Diagnosis Date   Arthritis    Hypertension     Patient Active Problem List   Diagnosis Date Noted   Chronic hepatitis C without hepatic coma (HCC) 04/21/2021   Left leg pain 04/18/2021   History of ankle fracture 04/18/2021   Tobacco use 04/18/2021   Essential hypertension 03/23/2020   Fatty liver 03/23/2020   Morbid obesity with BMI of 45.0-49.9, adult (HCC) 03/23/2020   Multiple thyroid nodules 07/14/2018    Past Surgical History:  Procedure Laterality Date   ABDOMINAL HYSTERECTOMY     2005   ANKLE FRACTURE SURGERY Left    arthroscopy of left knee  2008   for cartilage surgery   CESAREAN SECTION     2 previous   LEG SURGERY      OB History     Gravida  3   Para  2   Term  2   Preterm      AB  1   Living  2      SAB      IAB  1   Ectopic      Multiple      Live Births               Home Medications    Prior to Admission medications   Medication Sig Start Date End Date Taking? Authorizing Provider  Acetaminophen (TYLENOL ARTHRITIS PAIN PO) Take 1 tablet by mouth daily.    [provider]  albuterol (VENTOLIN HFA) 108 (90 Base) MCG/ACT inhaler Inhale 1-2 puffs into the lungs every 6 (six) hours as  needed for wheezing or shortness of breath. 01/10/22   Gustavus Bryant, FNP  Apple Cider Vinegar 500 MG TABS Take 1 each by mouth.    [provider]  chlorthalidone (HYGROTON) 25 MG tablet Take 1 tablet (25 mg total) by mouth daily. 02/28/22   Storm Frisk, MD  clobetasol cream (TEMOVATE) 0.05 % Apply 1 Application topically 2 (two) times daily. To affected area on left forearm 02/28/22   Storm Frisk, MD  diazepam (VALIUM) 5 MG tablet Take one tablet by mouth with food one hour prior to procedure. May repeat 30 minutes prior if needed. Patient not taking: Reported on 03/14/2022 02/14/22   Juanda Chance, NP  diclofenac Sodium (VOLTAREN) 1 % GEL Apply 2 g topically 4 (four) times daily. To left ankle Patient not taking: Reported on 03/14/2022 04/18/21   Storm Frisk, MD  Sofosbuvir-Velpatasvir (EPCLUSA) 400-100 MG TABS Take 1 tablet by mouth daily. Patient not taking: Reported on 03/14/2022 11/27/21   Jennette Kettle, RPH-CPP  traMADol (ULTRAM) 50 MG tablet Take 1 tablet (50 mg total)  by mouth every 6 (six) hours as needed for moderate pain. 01/01/22   Newt Minion, MD    Family History Family History  Problem Relation Age of Onset   Diabetes Mother    Hypertension Mother    Lymphoma Brother     Social History Social History   Tobacco Use   Smoking status: Some Days    Packs/day: 0.10    Types: Cigarettes   Smokeless tobacco: Never   Tobacco comments:    Smokes 2 cigarettes/day - cutting back  Vaping Use   Vaping Use: Never used  Substance Use Topics   Alcohol use: Yes    Comment: occassionally   Drug use: No     Allergies   Patient has no known allergies.   Review of Systems Review of Systems  Constitutional:  Negative for chills and fever.  HENT:  Positive for congestion and sore throat. Negative for ear pain.   Eyes:  Negative for discharge and redness.  Respiratory:  Positive for cough. Negative for shortness of breath and wheezing.    Gastrointestinal:  Negative for abdominal pain, diarrhea, nausea and vomiting.  Neurological:  Positive for headaches.     Physical Exam Triage Vital Signs ED Triage Vitals  Enc Vitals Group     BP 03/30/22 1037 124/75     Pulse Rate 03/30/22 1037 78     Resp 03/30/22 1037 17     Temp 03/30/22 1037 98.3 F (36.8 C)     Temp Source 03/30/22 1037 Oral     SpO2 03/30/22 1037 95 %     Weight --      Height --      Head Circumference --      Peak Flow --      Pain Score 03/30/22 1039 6     Pain Loc --      Pain Edu? --      Excl. in [place]? --    No data found.  Updated Vital Signs BP 124/75 (BP Location: Left Arm)   Pulse 78   Temp 98.3 F (36.8 C) (Oral)   Resp 17   SpO2 95%   Physical Exam Vitals and nursing note reviewed.  Constitutional:      General: She is not in acute distress.    Appearance: Normal appearance. She is not ill-appearing.  HENT:     Head: Normocephalic and atraumatic.     Nose: Congestion present.     Mouth/Throat:     Mouth: Mucous membranes are moist.     Pharynx: No oropharyngeal exudate or posterior oropharyngeal erythema.  Eyes:     Conjunctiva/sclera: Conjunctivae normal.  Cardiovascular:     Rate and Rhythm: Normal rate and regular rhythm.     Heart sounds: Normal heart sounds. No murmur heard. Pulmonary:     Effort: Pulmonary effort is normal. No respiratory distress.     Breath sounds: Normal breath sounds. No wheezing, rhonchi or rales.  Skin:    General: Skin is warm and dry.  Neurological:     Mental Status: She is alert.  Psychiatric:        Mood and Affect: Mood normal.        Thought Content: Thought content normal.      UC Treatments / Results  Labs (all labs ordered are listed, but only abnormal results are displayed) Labs Reviewed  SARS CORONAVIRUS 2 (TAT 6-24 HRS)  POCT INFLUENZA A/B    EKG   Radiology No results  found.  Procedures Procedures (including critical care time)  Medications Ordered in  UC Medications - No data to display  Initial Impression / Assessment and Plan / UC Course  I have reviewed the triage vital signs and the nursing notes.  Pertinent labs & imaging results that were available during my care of the patient were reviewed by me and considered in my medical decision making (see chart for details).    Suspect viral etiology of symptoms. Flu test negative in office. Will order covid screening. Recommend symptomatic treatment and follow up if no gradual improvement or with any further concerns.   Final Clinical Impressions(s) / UC Diagnoses   Final diagnoses:  Encounter for screening for COVID-19  Acute upper respiratory infection   Discharge Instructions   None    ED Prescriptions   None    PDMP not reviewed this encounter.   Tomi Bamberger, PA-C 03/30/22 1448

## 2022-03-31 LAB — SARS CORONAVIRUS 2 (TAT 6-24 HRS): SARS Coronavirus 2: POSITIVE — AB

## 2022-04-05 ENCOUNTER — Ambulatory Visit: Payer: Commercial Managed Care - HMO | Admitting: Physician Assistant

## 2022-04-30 ENCOUNTER — Emergency Department (HOSPITAL_COMMUNITY): Payer: Commercial Managed Care - HMO

## 2022-04-30 ENCOUNTER — Emergency Department (HOSPITAL_COMMUNITY)
Admission: EM | Admit: 2022-04-30 | Discharge: 2022-04-30 | Disposition: A | Discharge status: Home / Self Care | Payer: Commercial Managed Care - HMO | Attending: Emergency Medicine | Admitting: Emergency Medicine

## 2022-04-30 ENCOUNTER — Encounter (HOSPITAL_COMMUNITY): Payer: Self-pay

## 2022-04-30 ENCOUNTER — Other Ambulatory Visit: Payer: Self-pay

## 2022-04-30 DIAGNOSIS — I10 Essential (primary) hypertension: Secondary | ICD-10-CM | POA: Insufficient documentation

## 2022-04-30 DIAGNOSIS — Z8616 Personal history of COVID-19: Secondary | ICD-10-CM | POA: Diagnosis not present

## 2022-04-30 DIAGNOSIS — R0602 Shortness of breath: Secondary | ICD-10-CM | POA: Diagnosis present

## 2022-04-30 DIAGNOSIS — R519 Headache, unspecified: Secondary | ICD-10-CM | POA: Insufficient documentation

## 2022-04-30 DIAGNOSIS — F17200 Nicotine dependence, unspecified, uncomplicated: Secondary | ICD-10-CM | POA: Insufficient documentation

## 2022-04-30 DIAGNOSIS — E876 Hypokalemia: Secondary | ICD-10-CM | POA: Diagnosis not present

## 2022-04-30 DIAGNOSIS — J069 Acute upper respiratory infection, unspecified: Secondary | ICD-10-CM | POA: Diagnosis not present

## 2022-04-30 LAB — CBC
HCT: 40.8 % (ref 36.0–46.0)
Hemoglobin: 13.6 g/dL (ref 12.0–15.0)
MCH: 29 pg (ref 26.0–34.0)
MCHC: 33.3 g/dL (ref 30.0–36.0)
MCV: 87 fL (ref 80.0–100.0)
Platelets: 235 10*3/uL (ref 150–400)
RBC: 4.69 MIL/uL (ref 3.87–5.11)
RDW: 13.6 % (ref 11.5–15.5)
WBC: 9.8 10*3/uL (ref 4.0–10.5)
nRBC: 0 % (ref 0.0–0.2)

## 2022-04-30 LAB — BASIC METABOLIC PANEL
Anion gap: 9 (ref 5–15)
BUN: 10 mg/dL (ref 6–20)
CO2: 29 mmol/L (ref 22–32)
Calcium: 9.6 mg/dL (ref 8.9–10.3)
Chloride: 101 mmol/L (ref 98–111)
Creatinine, Ser: 0.54 mg/dL (ref 0.44–1.00)
GFR, Estimated: >60 mL/min (ref >60)
Glucose, Bld: 109 mg/dL — ABNORMAL HIGH (ref 70–99)
Potassium: 2.8 mmol/L — ABNORMAL LOW (ref 3.5–5.1)
Sodium: 139 mmol/L (ref 135–145)

## 2022-04-30 LAB — MAGNESIUM: Magnesium: 2 mg/dL (ref 1.7–2.4)

## 2022-04-30 LAB — I-STAT BETA HCG BLOOD, ED (MC, WL, AP ONLY): I-stat hCG, quantitative: <5 m[IU]/mL (ref <5)

## 2022-04-30 LAB — D-DIMER, QUANTITATIVE: D-Dimer, Quant: 1.58 ug/mL-FEU — ABNORMAL HIGH (ref 0.00–0.50)

## 2022-04-30 LAB — TROPONIN I (HIGH SENSITIVITY)
Troponin I (High Sensitivity): 13 ng/L (ref <18)
Troponin I (High Sensitivity): 9 ng/L (ref <18)

## 2022-04-30 MED ORDER — NITROGLYCERIN 0.4 MG SL SUBL
0.4000 mg | SUBLINGUAL_TABLET | SUBLINGUAL | Status: DC | PRN
Start: 2022-04-30 — End: 2022-04-30
  Administered 2022-04-30 (×2): 0.4 mg via SUBLINGUAL
  Filled 2022-04-30: qty 1

## 2022-04-30 MED ORDER — POTASSIUM CHLORIDE 10 MEQ/100ML IV SOLN
10.0000 meq | INTRAVENOUS | Status: AC
  Administered 2022-04-30 (×3): 10 meq via INTRAVENOUS
  Filled 2022-04-30 (×3): qty 100

## 2022-04-30 MED ORDER — BENZONATATE 100 MG PO CAPS: 100.0000 mg | ORAL_CAPSULE | Freq: Three times a day (TID) | ORAL | 0 refills | Status: DC

## 2022-04-30 MED ORDER — MAGNESIUM SULFATE 2 GM/50ML IV SOLN: 2.0000 g | Freq: Once | INTRAVENOUS | Status: DC

## 2022-04-30 MED ORDER — DIPHENHYDRAMINE HCL 50 MG/ML IJ SOLN: 25.0000 mg | Freq: Once | INTRAMUSCULAR | Status: DC

## 2022-04-30 MED ORDER — KETOROLAC TROMETHAMINE 30 MG/ML IJ SOLN
30.0000 mg | Freq: Once | INTRAMUSCULAR | Status: AC
Start: 2022-04-30 — End: 2022-04-30
  Administered 2022-04-30: 30 mg via INTRAVENOUS
  Filled 2022-04-30: qty 1

## 2022-04-30 MED ORDER — POTASSIUM CHLORIDE CRYS ER 20 MEQ PO TBCR
40.0000 meq | EXTENDED_RELEASE_TABLET | Freq: Once | ORAL | Status: AC
  Administered 2022-04-30: 40 meq via ORAL
  Filled 2022-04-30: qty 2

## 2022-04-30 MED ORDER — MORPHINE SULFATE (PF) 2 MG/ML IV SOLN
2.0000 mg | Freq: Once | INTRAVENOUS | Status: AC
  Administered 2022-04-30: 2 mg via INTRAVENOUS
  Filled 2022-04-30: qty 1

## 2022-04-30 MED ORDER — PROCHLORPERAZINE EDISYLATE 10 MG/2ML IJ SOLN: 10.0000 mg | Freq: Once | INTRAMUSCULAR | Status: DC

## 2022-04-30 MED ORDER — IOHEXOL 350 MG/ML SOLN
100.0000 mL | Freq: Once | INTRAVENOUS | Status: AC | PRN
  Administered 2022-04-30: 57 mL via INTRAVENOUS

## 2022-04-30 MED ORDER — SODIUM CHLORIDE (PF) 0.9 % IJ SOLN
INTRAMUSCULAR | Status: AC
  Filled 2022-04-30: qty 50

## 2022-04-30 MED ORDER — LABETALOL HCL 5 MG/ML IV SOLN
5.0000 mg | Freq: Once | INTRAVENOUS | Status: AC
  Administered 2022-04-30: 5 mg via INTRAVENOUS
  Filled 2022-04-30: qty 4

## 2022-04-30 NOTE — Discharge Instructions (Addendum)
You were evaluated in the Emergency Department and after careful evaluation, we did not find any emergent condition requiring admission or further testing in the hospital.  Your workup today was overall reassuring.  Your scans did not show evidence of pneumonia or blood clot in your lungs.  I suspect you have a viral infection.  Take Tessalon Perles as needed for cough.  You may also take anti-inflammatory such as Tylenol and ibuprofen.  Your blood pressure was also very elevated today, please follow-up with your primary care doctor to have this rechecked and your medication regimen reevaluated.  Your potassium was also low today, see the handout for high potassium foods/I would recommend the take supplements since you are on a diuretic.  Please return to the Emergency Department if you experience any worsening of your condition.  We encourage you to follow up with a primary care provider.  Thank you for allowing [place] to be a part of your care.

## 2022-04-30 NOTE — ED Triage Notes (Addendum)
Patient states that she  testes positive for COVID 2-3 weeks ago, but on Friday she started having a sore throat, and it has gotten progressively worse, she is now having SOB, and pain with coughing, she also states she has the shakes.

## 2022-04-30 NOTE — ED Provider Triage Note (Signed)
Emergency Medicine Provider Triage Evaluation Note  Sally Reid , a 61 y.o. female  was evaluated in triage.  Pt complains of chest pain, shortness of breath, cough, headache. Dx with covid one month ago. Symptoms worse since Friday. Reports chest pain is pressure in nature, worse with cough, not significantly exertional in nature.  Denies vision changes, numbness, tingling with headache.  Patient reports she took her normal blood pressure medication today.  Review of Systems  Positive: Chest pain, cough, shortness of breath, headache Negative: Fever, chills  Physical Exam  BP (!) 220/86 (BP Location: Right Arm)   Pulse (!) 104   Temp 98.3 F (36.8 C) (Oral)   Resp 18   Ht 4\' 11"  (1.499 m)   Wt 104.3 kg   SpO2 100%   BMI 46.45 kg/m  Gen:   Awake, no distress   Resp:  Normal effort  MSK:   Moves extremities without difficulty  Other:    Medical Decision Making  Medically screening exam initiated at 3:17 AM.  Appropriate orders placed.  Yareni Creps was informed that the remainder of the evaluation will be completed by another provider, this initial triage assessment does not replace that evaluation, and the importance of remaining in the ED until their evaluation is complete.  Workup initiated   Carlyle Basques, Olene Floss 04/30/22 05/02/22

## 2022-04-30 NOTE — ED Notes (Signed)
Pt is off the floor in CT, will get vitals on return

## 2022-04-30 NOTE — ED Provider Notes (Signed)
[facility] Note   CSN: [id number] Arrival date & time: 04/30/22  0242     History  Chief Complaint  Patient presents with   Chest Pain   Shortness of Breath    Sally Reid is a 61 y.o. female.  HPI Sally Reid is a 61 y.o. female with a history significant for HTN, tobacco use, and recent COVID 1 month ago. She presents today with cough, chest pain and SOB since Friday. Patient describes a productive cough with clear and yellow tinged sputum. States that SOB is worse with her coughing and better with sitting up. She states chest pain is 9/10. Pt states that chest pain is centralized, non radiating and achy. Pt also states that she has had a headache since Friday. She denies changes in vision, N/V, LE edema. No history of COPD/asthma, CHF, PE/DVT. H/o HTN, reports taking chlorthalidone and has been compliant. Pt has been taking mucinex, tylenol, and cough drops with no relief.     Home Medications Prior to Admission medications   Medication Sig Start Date End Date Taking? Authorizing Provider  benzonatate (TESSALON) 100 MG capsule Take 1 capsule (100 mg total) by mouth every 8 (eight) hours. 04/30/22  Yes Mare Ferrari, PA-C  Acetaminophen (TYLENOL ARTHRITIS PAIN PO) Take 1 tablet by mouth daily.    [provider]  albuterol (VENTOLIN HFA) 108 (90 Base) MCG/ACT inhaler Inhale 1-2 puffs into the lungs every 6 (six) hours as needed for wheezing or shortness of breath. 01/10/22   Gustavus Bryant, FNP  Apple Cider Vinegar 500 MG TABS Take 1 each by mouth.    [provider]  chlorthalidone (HYGROTON) 25 MG tablet Take 1 tablet (25 mg total) by mouth daily. 02/28/22   Storm Frisk, MD  clobetasol cream (TEMOVATE) 0.05 % Apply 1 Application topically 2 (two) times daily. To affected area on left forearm 02/28/22   Storm Frisk, MD  diazepam (VALIUM) 5 MG tablet Take one tablet by mouth with food one hour prior to  procedure. May repeat 30 minutes prior if needed. Patient not taking: Reported on 03/14/2022 02/14/22   Juanda Chance, NP  diclofenac Sodium (VOLTAREN) 1 % GEL Apply 2 g topically 4 (four) times daily. To left ankle Patient not taking: Reported on 03/14/2022 04/18/21   Storm Frisk, MD  Sofosbuvir-Velpatasvir (EPCLUSA) 400-100 MG TABS Take 1 tablet by mouth daily. Patient not taking: Reported on 03/14/2022 11/27/21   Jennette Kettle, RPH-CPP  traMADol (ULTRAM) 50 MG tablet Take 1 tablet (50 mg total) by mouth every 6 (six) hours as needed for moderate pain. 01/01/22   Nadara Mustard, MD      Allergies    Patient has no known allergies.    Review of Systems   Review of Systems Ten systems reviewed and are negative for acute change, except as noted in the HPI.   Physical Exam Updated Vital Signs BP (!) 142/63 (BP Location: Left Arm)   Pulse 65   Temp 98.5 F (36.9 C) (Oral)   Resp 18   Ht 4\' 11"  (1.499 m)   Wt 104.3 kg   SpO2 100%   BMI 46.45 kg/m  Physical Exam Vitals and nursing note reviewed.  Constitutional:      General: She is not in acute distress.    Appearance: She is well-developed.  HENT:     Head: Normocephalic and atraumatic.  Eyes:     Conjunctiva/sclera: Conjunctivae normal.  Cardiovascular:     Rate and Rhythm: Normal rate and regular rhythm.     Heart sounds: No murmur heard. Pulmonary:     Effort: Pulmonary effort is normal. No respiratory distress.     Breath sounds: Normal breath sounds. No decreased breath sounds, wheezing, rhonchi or rales.  Chest:     Chest wall: Tenderness present.       Comments: Mild reproducible chest tenderness, no crepitus Abdominal:     Palpations: Abdomen is soft.     Tenderness: There is no abdominal tenderness.  Musculoskeletal:        General: No swelling.     Cervical back: Neck supple.     Right lower leg: No edema.     Left lower leg: No edema.  Skin:    General: Skin is warm and dry.     Capillary  Refill: Capillary refill takes less than 2 seconds.  Neurological:     Mental Status: She is alert.  Psychiatric:        Mood and Affect: Mood normal.     ED Results / Procedures / Treatments   Labs (all labs ordered are listed, but only abnormal results are displayed) Labs Reviewed  BASIC METABOLIC PANEL - Abnormal; Notable for the following components:      Result Value   Potassium 2.8 (*)    Glucose, Bld 109 (*)    All other components within normal limits  D-DIMER, QUANTITATIVE - Abnormal; Notable for the following components:   D-Dimer, Quant 1.58 (*)    All other components within normal limits  CBC  MAGNESIUM  I-STAT BETA HCG BLOOD, ED ([facility], [facility], [facility] ONLY)  TROPONIN I (HIGH SENSITIVITY)  TROPONIN I (HIGH SENSITIVITY)    EKG EKG Interpretation  Date/Time:  Monday April 30 2022 02:54:54 EST Ventricular Rate:  101 PR Interval:  126 QRS Duration: 80 QT Interval:  456 QTC Calculation: 592 R Axis:   67 Text Interpretation: Sinus tachycardia Prolonged QT interval Confirmed by Geoffery LyonseLo, Douglas ([id number]) on 04/30/2022 3:00:53 AM  Radiology CT Angio Chest PE W and/or Wo Contrast  Result Date: 04/30/2022 CLINICAL DATA:  Elevated D-dimer. Chest pain and shortness of breath. EXAM: CT ANGIOGRAPHY CHEST WITH CONTRAST TECHNIQUE: Multidetector CT imaging of the chest was performed using the standard protocol during bolus administration of intravenous contrast. Multiplanar CT image reconstructions and MIPs were obtained to evaluate the vascular anatomy. RADIATION DOSE REDUCTION: This exam was performed according to the departmental dose-optimization program which includes automated exposure control, adjustment of the mA and/or kV according to patient size and/or use of iterative reconstruction technique. CONTRAST:  57mL OMNIPAQUE IOHEXOL 350 MG/ML SOLN COMPARISON:  Radiography same day FINDINGS: Cardiovascular: Heart size upper limits of normal. No visible coronary artery calcification.  Minimal aortic atherosclerotic calcification. Pulmonary arterial opacification is good. There are no pulmonary emboli. Mediastinum/Nodes: No mass or lymphadenopathy. Lungs/Pleura: No emphysema. No pleural effusion. No infiltrate or collapse. No mass or nodule. Upper Abdomen: Normal Musculoskeletal: Normal except for mild scoliotic curvature. Review of the MIP images confirms the above findings. IMPRESSION: 1. No pulmonary emboli or other acute chest pathology. 2. Heart size upper limits of normal. No visible coronary artery calcification. Minimal aortic atherosclerotic calcification. Electronically Signed   By: Paulina FusiMark  Shogry M.D.   On: 04/30/2022 11:18   CT Head Wo Contrast  Result Date: 04/30/2022 CLINICAL DATA:  Headache, worsening since Friday. EXAM: CT HEAD WITHOUT CONTRAST TECHNIQUE: Contiguous axial images were obtained from the base of the skull  through the vertex without intravenous contrast. RADIATION DOSE REDUCTION: This exam was performed according to the departmental dose-optimization program which includes automated exposure control, adjustment of the mA and/or kV according to patient size and/or use of iterative reconstruction technique. COMPARISON:  04/07/2021. FINDINGS: Brain: No acute intracranial hemorrhage, midline shift or mass effect. No extra-axial fluid collection. Mild periventricular white matter hypodensities are present bilaterally. No hydrocephalus. Vascular: No hyperdense vessel or unexpected calcification. Skull: Normal. Negative for fracture or focal lesion. Sinuses/Orbits: Minimal air-fluid level in the right maxillary sinus. No acute orbital abnormality. Other: None. IMPRESSION: 1. No acute intracranial process. 2. Chronic microvascular ischemic changes. Electronically Signed   By: Thornell Sartorius M.D.   On: 04/30/2022 03:40   DG Chest 2 View  Result Date: 04/30/2022 CLINICAL DATA:  Chest pain. EXAM: CHEST - 2 VIEW COMPARISON:  January 10, 2022 FINDINGS: The heart size and  mediastinal contours are within normal limits. Both lungs are clear. The visualized skeletal structures are unremarkable. IMPRESSION: No active cardiopulmonary disease. Electronically Signed   By: Aram Candela M.D.   On: 04/30/2022 03:30    Procedures Procedures    Medications Ordered in ED Medications  nitroGLYCERIN (NITROSTAT) SL tablet 0.4 mg (0.4 mg Sublingual Given 04/30/22 0739)  morphine (PF) 2 MG/ML injection 2 mg (2 mg Intravenous Given 04/30/22 0528)  potassium chloride SA (KLOR-CON M) CR tablet 40 mEq (40 mEq Oral Given 04/30/22 0734)  potassium chloride 10 mEq in 100 mL IVPB (0 mEq Intravenous Stopped 04/30/22 1051)  ketorolac (TORADOL) 30 MG/ML injection 30 mg (30 mg Intravenous Given 04/30/22 0734)  labetalol (NORMODYNE) injection 5 mg (5 mg Intravenous Given 04/30/22 0841)  sodium chloride (PF) 0.9 % injection (  Given by Other 04/30/22 1015)  iohexol (OMNIPAQUE) 350 MG/ML injection 100 mL (57 mLs Intravenous Contrast Given 04/30/22 1055)    ED Course/ Medical Decision Making/ A&P Clinical Course as of 04/30/22 1325  Mon Apr 30, 2022  0813 Stable 38 YOF with a chief complaint of chest pain.  [CC]  1016 Pt reports questionable itching "can't exactly remember" with contrast administration. No throat closing, rash. Discussed risk vs pro's, no indication for pre-treatment, will give benadryl if patient has itching. Ok to proceed forward with contrast imaging   [MB]    Clinical Course User Index [CC] Glyn Ade, MD [MB] Mare Ferrari, PA-C                           Medical Decision Making Amount and/or Complexity of Data Reviewed Labs: ordered. Radiology: ordered.  Risk Prescription drug management.   INITIAL IMPRESSION / ASSESSMENT AND PLAN / ED COURSE   Pertinent labs & imaging results that were available during my care of the patient were reviewed by me and considered in my medical decision making (see chart for details).   This patient is  Presenting for Evaluation of cough, chest pain, which does require a range of treatment options, and is a complaint that involves a high risk of morbidity and mortality.   The Differential Diagnoses includes but is not exclusive to musculoskeletal pain, PE, pneumonia, ACS, pneumothorax, CHF, COPD/asthma     I did obtain Additional Historical Information from chart review, prior visits     Clinical Laboratory Tests ordered, reviewed, and interpreted by me -CBC unremarkable -BMP with hypokalemia, repleted -Magnesium normal -Delta troponin negative -D-dimer elevated at 1.58    Radiologic Tests Ordered, CT PE study I independently interpreted  the images and agree with radiology interpretation.    IMPRESSION:  1. No pulmonary emboli or other acute chest pathology.  2. Heart size upper limits of normal. No visible coronary artery  calcification. Minimal aortic atherosclerotic calcification.   Cardiac Monitor Tracing which shows  - Mild sinus tachycardia, improved   Social Determinants of Health Risk: none     Medical Decision Making: Summary:  61 year old female presenting with cough and pain.  Overall workup reassuring, CT PE study with no PE, no evidence of pneumonia.  Suspect musculoskeletal pain given frequent coughing.  No evidence of asthma exacerbation.  Suspect lingering cough from COVID-19.  She was hypertensive on arrival, is only on chlorthalidone.  Her potassium was low, which was repleted, recommended that she take potassium supplements.  She was given labetalol for hypertension with improvement.  No signs of hypertensive urgency/emergency.   Reevaluation with update and discussion with patient, she notes improvement, requesting work note.  Recommended initiation of high potassium foods/supplements, recheck with PCP for blood pressure, and Tessalon Perles for cough  Considered admission, patient is hemodynamically stable, does not meet admission criteria Disposition:  Discharged with close PCP follow-up and strict return precautions  Final Clinical Impression(s) / ED Diagnoses Final diagnoses:  Upper respiratory tract infection, unspecified type  Hypokalemia  Uncontrolled hypertension    Rx / DC Orders ED Discharge Orders          Ordered    benzonatate (TESSALON) 100 MG capsule  Every 8 hours        04/30/22 1257              Mare Ferrari, PA-C 04/30/22 1326    Glyn Ade, MD 05/02/22 [phone]

## 2022-05-01 ENCOUNTER — Telehealth: Payer: Self-pay | Admitting: Critical Care Medicine

## 2022-05-01 NOTE — Telephone Encounter (Signed)
Let patient know her mammogram was normal

## 2022-05-02 NOTE — Progress Notes (Unsigned)
Sally Reid, is a 61 y.o. female  TWS:[id number]  GYF:[id number]  DOB - [birth year]  Chief Complaint  Patient presents with   Gynecologic Exam       Subjective:   Sally Reid is a 61 y.o. female here today for a follow up visit After being see at [facility] 04/30/2022 with continued cough s/p covid one month ago.  She is feeling better since that visit.  She is here for pap today.  She says she has had a partial hysterectomy.    From ED note 04/30/2022: Medical Decision Making: Summary:  61 year old female presenting with cough and pain.  Overall workup reassuring, CT PE study with no PE, no evidence of pneumonia.  Suspect musculoskeletal pain given frequent coughing.  No evidence of asthma exacerbation.  Suspect lingering cough from COVID-19.  She was hypertensive on arrival, is only on chlorthalidone.  Her potassium was low, which was repleted, recommended that she take potassium supplements.  She was given labetalol for hypertension with improvement.  No signs of hypertensive urgency/emergency.   Reevaluation with update and discussion with patient, she notes improvement, requesting work note.  Recommended initiation of high potassium foods/supplements, recheck with PCP for blood pressure, and Tessalon Perles for cough  No problems updated.  ALLERGIES: No Known Allergies  PAST MEDICAL HISTORY: Past Medical History:  Diagnosis Date   Arthritis    Hypertension     MEDICATIONS AT HOME: Prior to Admission medications   Medication Sig Start Date End Date Taking? Authorizing Provider  albuterol (VENTOLIN HFA) 108 (90 Base) MCG/ACT inhaler Inhale 1-2 puffs into the lungs every 6 (six) hours as needed for wheezing or shortness of breath. 01/10/22  Yes [place], Acie Fredrickson, FNP  Apple Cider Vinegar 500 MG TABS Take 1 each by mouth.   Yes [provider]  benzonatate (TESSALON) 100 MG capsule Take 1 capsule (100 mg total) by mouth every 8 (eight) hours. 04/30/22  Yes Mare Ferrari, PA-C  chlorthalidone (HYGROTON) 25 MG tablet Take 1 tablet (25 mg total) by mouth daily. 02/28/22  Yes Storm Frisk, MD  clobetasol cream (TEMOVATE) 0.05 % Apply 1 Application topically 2 (two) times daily. To affected area on left forearm 02/28/22  Yes Storm Frisk, MD  diclofenac Sodium (VOLTAREN) 1 % GEL Apply 2 g topically 4 (four) times daily. To left ankle 04/18/21  Yes Storm Frisk, MD  traMADol (ULTRAM) 50 MG tablet Take 1 tablet (50 mg total) by mouth every 6 (six) hours as needed for moderate pain. 01/01/22  Yes Nadara Mustard, MD  Acetaminophen (TYLENOL ARTHRITIS PAIN PO) Take 1 tablet by mouth daily. Patient not taking: Reported on 05/03/2022    [provider]  diazepam (VALIUM) 5 MG tablet Take one tablet by mouth with food one hour prior to procedure. May repeat 30 minutes prior if needed. Patient not taking: Reported on 03/14/2022 02/14/22   Juanda Chance, NP  Sofosbuvir-Velpatasvir (EPCLUSA) 400-100 MG TABS Take 1 tablet by mouth daily. Patient not taking: Reported on 03/14/2022 11/27/21   Jennette Kettle, RPH-CPP    ROS: Neg HEENT Neg resp Neg cardiac Neg GI Neg GU Neg MS Neg psych Neg neuro  Objective:   Vitals:   05/03/22 1524  BP: 135/85  Pulse: 80  SpO2: 99%  Weight: 233 lb 12.8 oz (106.1 kg)   Exam General appearance : Awake, alert, not in any distress. Speech Clear. Not toxic looking HEENT: Atraumatic and Normocephalic External genitalia wnl-vaginal mucosa  dry and friable.  No cervix visible(partial hysterectomy) pap and swabs taken.  Bimanual unremarkable Extremities: B/L Lower Ext shows no edema, both legs are warm to touch Neurology: Awake alert, and oriented X 3, CN II-XII intact, Non focal Skin: No Rash  Data Review Lab Results  Component Value Date   HGBA1C  04/11/2009    5.2 (NOTE) The ADA recommends the following therapeutic goal for glycemic control related to Hgb A1c measurement: Goal of therapy: <6.5 Hgb A1c   Reference: American Diabetes Association: Clinical Practice Recommendations 2010, Diabetes Care, 2010, 33: (Suppl  1).    Assessment & Plan   1. Cervical cancer screening - Cytology - PAP([facility])  2. Screening examination for STD (sexually transmitted disease) - Cervicovaginal ancillary only  3. Encounter for examination following treatment at hospital  4. Hypokalemia-check BMP  Return for keep next appt in January with Dr Delford Field.  The patient was given clear instructions to go to ER or return to medical center if symptoms don't improve, worsen or new problems develop. The patient verbalized understanding. The patient was told to call to get lab results if they haven't heard anything in the next week.      Georgian Co, PA-C [facility] [place], [place] [phone]   05/03/2022, 3:34 PMPatient ID: Sally Reid, female   DOB: November 26, 1960, 61 y.o.   MRN: [id number]

## 2022-05-03 ENCOUNTER — Encounter: Payer: Self-pay | Admitting: Physician Assistant

## 2022-05-03 ENCOUNTER — Other Ambulatory Visit (HOSPITAL_COMMUNITY)
Admission: RE | Admit: 2022-05-03 | Discharge: 2022-05-03 | Disposition: A | Discharge status: Home / Self Care | Payer: Commercial Managed Care - HMO | Source: Ambulatory Visit | Attending: Physician Assistant | Admitting: Physician Assistant

## 2022-05-03 ENCOUNTER — Ambulatory Visit: Payer: Commercial Managed Care - HMO | Attending: Physician Assistant | Admitting: Physician Assistant

## 2022-05-03 VITALS — BP 135/85 | HR 80 | Wt 233.8 lb

## 2022-05-03 DIAGNOSIS — Z124 Encounter for screening for malignant neoplasm of cervix: Secondary | ICD-10-CM

## 2022-05-03 DIAGNOSIS — E876 Hypokalemia: Secondary | ICD-10-CM

## 2022-05-03 DIAGNOSIS — Z09 Encounter for follow-up examination after completed treatment for conditions other than malignant neoplasm: Secondary | ICD-10-CM

## 2022-05-03 DIAGNOSIS — Z113 Encounter for screening for infections with a predominantly sexual mode of transmission: Secondary | ICD-10-CM | POA: Insufficient documentation

## 2022-05-04 LAB — CERVICOVAGINAL ANCILLARY ONLY
Bacterial Vaginitis (gardnerella): NEGATIVE
Candida Glabrata: NEGATIVE
Candida Vaginitis: NEGATIVE
Chlamydia: NEGATIVE
Comment: NEGATIVE
Comment: NEGATIVE
Comment: NEGATIVE
Comment: NEGATIVE
Comment: NEGATIVE
Comment: NORMAL
Neisseria Gonorrhea: NEGATIVE
Trichomonas: NEGATIVE

## 2022-05-04 LAB — BASIC METABOLIC PANEL
BUN/Creatinine Ratio: 12 (ref 12–28)
BUN: 9 mg/dL (ref 8–27)
CO2: 26 mmol/L (ref 20–29)
Calcium: 9.9 mg/dL (ref 8.7–10.3)
Chloride: 101 mmol/L (ref 96–106)
Creatinine, Ser: 0.73 mg/dL (ref 0.57–1.00)
Glucose: 88 mg/dL (ref 70–99)
Potassium: 4.1 mmol/L (ref 3.5–5.2)
Sodium: 140 mmol/L (ref 134–144)
eGFR: 94 mL/min/{1.73_m2} (ref >59)

## 2022-05-04 NOTE — Telephone Encounter (Signed)
Called patient and DOB was confirmed and she is aware

## 2022-05-07 ENCOUNTER — Ambulatory Visit: Payer: Self-pay | Admitting: *Deleted

## 2022-05-07 DIAGNOSIS — E876 Hypokalemia: Secondary | ICD-10-CM

## 2022-05-07 NOTE — Telephone Encounter (Signed)
Summary: potassium concern / rx req   The patient has been directed to contact their PCP and request a prescription for potassium  The patient has been told that their blood pressure medication is causing a decrease in their potassium  The patient would like to speak with a member of clinical staff when possible regarding their concern  Please contact further when available         Patient was seen at ED- virus. Patient was given K+ IV and oral medication Reason for Disposition . [1] Caller has NON-URGENT medicine question about med that PCP prescribed AND [2] triager unable to answer question  Answer Assessment - Initial Assessment Questions 1. NAME of MEDICINE: "What medicine(s) are you calling about?"     Potasium  2. QUESTION: "What is your question?" (e.g., double dose of medicine, side effect)     Patient was seen in ED and treated for hypokalemia  3. PRESCRIBER: "Who prescribed the medicine?" Reason: if prescribed by specialist, call should be referred to that group.     ED/PCP 4. SYMPTOMS: "Do you have any symptoms?" If Yes, ask: "What symptoms are you having?"  "How bad are the symptoms (e.g., mild, moderate, severe)     Patient was seen for URI, cough- found to have low K+ level- was advised could be due to her BP medication. Patient would like to ask PCP if she should be on supplement- or if she needs to come in before 1/30 for appointment-ED f/u? (Patient advised of her most recent lab results from [facility].)  Please let her know.  Protocols used: Medication Question Call-A-AH

## 2022-05-08 LAB — CYTOLOGY - PAP
Comment: NEGATIVE
Diagnosis: NEGATIVE
High risk HPV: NEGATIVE

## 2022-05-08 MED ORDER — POTASSIUM CHLORIDE CRYS ER 20 MEQ PO TBCR: 20.0000 meq | EXTENDED_RELEASE_TABLET | Freq: Every day | ORAL | 1 refills | Status: DC

## 2022-05-08 NOTE — Addendum Note (Signed)
Addended by: Shan Levans E on: 05/08/2022 10:40 AM   Modules accepted: Orders

## 2022-05-08 NOTE — Telephone Encounter (Signed)
Pt to stay on blood pressure medication , I will sent potassium supplement and put orders in for a lab visit in one week for potassium recheck,  needs OV with me next 3-4 weeks

## 2022-05-09 NOTE — Telephone Encounter (Signed)
Called patient and she stated that since her potasium is normal (last lab results) does she still need to take it     Lab results were given and DOB was confirmed

## 2022-05-09 NOTE — Telephone Encounter (Signed)
Stay on potassium until she returns will chek labs then for post hosp visit

## 2022-05-10 ENCOUNTER — Telehealth: Payer: Self-pay | Admitting: Critical Care Medicine

## 2022-05-10 NOTE — Telephone Encounter (Signed)
Called patient and left voicemail about getting back with [place] to talk about doctors note

## 2022-05-10 NOTE — Telephone Encounter (Signed)
Copied from CRM [id number]. Topic: General - Other >> May 10, 2022  5:05 PM Dondra Prader E wrote: Reason for CRM: Pt returned missed call from office. Please advise, relayed message from PCP on NT TE.

## 2022-05-10 NOTE — Telephone Encounter (Signed)
Called patient back and made lab appt for Friday at 330

## 2022-05-18 ENCOUNTER — Other Ambulatory Visit: Payer: Commercial Managed Care - HMO

## 2022-06-13 ENCOUNTER — Other Ambulatory Visit: Payer: Self-pay

## 2022-06-13 ENCOUNTER — Other Ambulatory Visit: Payer: Medicaid Other

## 2022-06-13 DIAGNOSIS — B182 Chronic viral hepatitis C: Secondary | ICD-10-CM

## 2022-06-15 LAB — COMPLETE METABOLIC PANEL WITH GFR
AG Ratio: 1.2 (calc) (ref 1.0–2.5)
ALT: 8 U/L (ref 6–29)
AST: 13 U/L (ref 10–35)
Albumin: 3.7 g/dL (ref 3.6–5.1)
Alkaline phosphatase (APISO): 52 U/L (ref 37–153)
BUN: 12 mg/dL (ref 7–25)
CO2: 30 mmol/L (ref 20–32)
Calcium: 9.7 mg/dL (ref 8.6–10.4)
Chloride: 103 mmol/L (ref 98–110)
Creat: 0.83 mg/dL (ref 0.50–1.05)
Globulin: 3.2 g/dL (calc) (ref 1.9–3.7)
Glucose, Bld: 90 mg/dL (ref 65–99)
Potassium: 3.5 mmol/L (ref 3.5–5.3)
Sodium: 141 mmol/L (ref 135–146)
Total Bilirubin: 0.4 mg/dL (ref 0.2–1.2)
Total Protein: 6.9 g/dL (ref 6.1–8.1)
eGFR: 80 mL/min/{1.73_m2} (ref >=60)

## 2022-06-15 LAB — CBC
HCT: 39.7 % (ref 35.0–45.0)
Hemoglobin: 13.5 g/dL (ref 11.7–15.5)
MCH: 29.2 pg (ref 27.0–33.0)
MCHC: 34 g/dL (ref 32.0–36.0)
MCV: 85.9 fL (ref 80.0–100.0)
MPV: 11.3 fL (ref 7.5–12.5)
Platelets: 253 10*3/uL (ref 140–400)
RBC: 4.62 10*6/uL (ref 3.80–5.10)
RDW: 13.3 % (ref 11.0–15.0)
WBC: 5.4 10*3/uL (ref 3.8–10.8)

## 2022-06-15 LAB — HEPATITIS C RNA QUANTITATIVE
HCV Quantitative Log: <1.18 log IU/mL
HCV RNA, PCR, QN: <15 IU/mL

## 2022-07-03 ENCOUNTER — Ambulatory Visit: Payer: Commercial Managed Care - HMO | Admitting: Critical Care Medicine

## 2022-07-04 ENCOUNTER — Ambulatory Visit: Payer: Commercial Managed Care - HMO | Admitting: Critical Care Medicine

## 2022-07-19 ENCOUNTER — Ambulatory Visit (INDEPENDENT_AMBULATORY_CARE_PROVIDER_SITE_OTHER): Payer: Medicaid Other

## 2022-07-19 ENCOUNTER — Ambulatory Visit (INDEPENDENT_AMBULATORY_CARE_PROVIDER_SITE_OTHER): Payer: Medicaid Other | Admitting: Orthopedic Surgery

## 2022-07-19 DIAGNOSIS — G8929 Other chronic pain: Secondary | ICD-10-CM

## 2022-07-19 DIAGNOSIS — M5416 Radiculopathy, lumbar region: Secondary | ICD-10-CM

## 2022-07-19 DIAGNOSIS — M25562 Pain in left knee: Secondary | ICD-10-CM | POA: Diagnosis not present

## 2022-07-20 ENCOUNTER — Encounter: Payer: Self-pay | Admitting: Orthopedic Surgery

## 2022-07-20 NOTE — Progress Notes (Signed)
Office Visit Note   [name]           Date of Birth: Nov 23, 1960           MRN: [id number] Visit Date: 07/19/2022              Requested by: Elsie Stain, MD [address] [place],  [place] [address] PCP: Elsie Stain, MD  Chief Complaint  Patient presents with   Left Leg - Pain      HPI: Patient is a 62 year old woman who presents with left knee pain as well as left-sided radicular symptoms from the buttocks to the knee.  She is status post epidural steroid injections with Dr. Ernestina Patches.  She states that her thigh pain has reoccurred.  She states that her back pain hurts more now than it did before.  Patient states the symptoms are worse with walking versus laying down.  Assessment & Plan: Visit Diagnoses:  1. Lumbar radiculopathy   2. Chronic pain of left knee     Plan: Patient will follow-up with Dr. Ernestina Patches for evaluation for repeat epidural steroid injection.  Patient states she would like to hold on a injection of the left knee at this time.  Follow-Up Instructions: No follow-ups on file.   Ortho Exam  Patient is alert, oriented, no adenopathy, well-dressed, normal affect, normal respiratory effort. Examination patient is a negative straight leg raise motor strength is symmetric in both lower extremities.  Left knee is tender over the medial lateral joint line there is crepitation in the patellofemoral joint with range of motion.  Imaging: XR Knee 1-2 Views Left  Result Date: 07/20/2022 2 view radiographs of the left knee shows lateral joint space narrowing with osteophytic bone spurs.  XR Lumbar Spine 2-3 Views  Result Date: 07/20/2022 Radiographs of the lumbar spine show degenerative disc disease without the submitted bone spurs with joint space collapse worse at L4-5 and L5-S1.  No images are attached to the encounter.  Labs: Lab Results  Component Value Date   HGBA1C  04/11/2009    5.2 (NOTE) The ADA recommends the  following therapeutic goal for glycemic control related to Hgb A1c measurement: Goal of therapy: <6.5 Hgb A1c  Reference: American Diabetes Association: Clinical Practice Recommendations 2010, Diabetes Care, 2010, 33: (Suppl  1).   REPTSTATUS 03/15/2020 FINAL 03/14/2020   CULT (A) 03/14/2020    <10,000 COLONIES/mL INSIGNIFICANT GROWTH Performed at [facility] [address], [place], [place] [address]      Lab Results  Component Value Date   ALBUMIN 3.5 04/07/2021   ALBUMIN 3.5 03/14/2020   ALBUMIN 3.6 11/28/2015    Lab Results  Component Value Date   MG 2.0 04/30/2022   MG 1.9 04/11/2009   No results found for: "VD25OH"  No results found for: "PREALBUMIN"    Latest Ref Rng & Units 06/13/2022    9:24 AM 04/30/2022    5:29 AM 11/15/2021    2:19 PM  CBC EXTENDED  WBC 3.8 - 10.8 Thousand/uL 5.4  9.8  6.9   RBC 3.80 - 5.10 Million/uL 4.62  4.69  4.75   Hemoglobin 11.7 - 15.5 g/dL 13.5  13.6  14.1   HCT 35.0 - 45.0 % 39.7  40.8  40.9   Platelets 140 - 400 Thousand/uL 253  235  273      There is no height or weight on file to calculate BMI.  Orders:  Orders Placed This Encounter  Procedures   XR Knee 1-2 Views Left   XR Lumbar Spine 2-3 Views   No orders of the defined types were placed in this encounter.    Procedures: No procedures performed  Clinical Data: No additional findings.  ROS:  All other systems negative, except as noted in the HPI. Review of Systems  Objective: Vital Signs: There were no vitals taken for this visit.  Specialty Comments:  EXAM: MRI LUMBAR SPINE WITHOUT CONTRAST   TECHNIQUE: Multiplanar, multisequence MR imaging of the lumbar spine was performed. No intravenous contrast was administered.   COMPARISON:  Radiographs of the lumbar spine 08/21/2021 (images available, report unavailable).   FINDINGS: Segmentation: 5 lumbar vertebrae. The caudal most well-formed into the vol disc space is designated L5-S1.    Alignment: Slight grade 1 retrolisthesis at L2-L3 and L3-L4. Slight grade 1 anterolisthesis at L4-L5 and L5-S1.   Vertebrae: Vertebral body height is maintained. Mild marrow edema within the posterior elements bilaterally at L4-L5 and L5-S1, likely degenerative and related to facet arthrosis. Minimal degenerative endplate edema at 075-GRM, T11-T12, T12-L1 and L5-S1.   Conus medullaris and cauda equina: Conus extends to the L1-L2 level. No signal abnormality identified within the visualized distal spinal cord.   Paraspinal and other soft tissues: No abnormality identified within included portions of the abdomen/retroperitoneum. No paraspinal mass or collection.   Disc levels:   No more than mild disc degeneration.   T10-T11: This level is imaged in the sagittal plane only. Slight disc bulge. No significant spinal canal or foraminal stenosis.   T11-T12: This level is imaged in the sagittal plane only. Small central disc protrusion. Mild effacement of ventral thecal sac (without spinal cord mass effect). No significant foraminal stenosis.   T12-L1: 10 mm central/right subarticular disc extrusion. The disc extrusion focally effaces the ventral thecal sac and likely contacts the ventral aspect of the spinal cord. The dorsal CSF space is maintained within the spinal canal. No significant foraminal stenosis.   L1-L2: Central posterior annular fissure. Small disc bulge. No significant spinal canal or foraminal stenosis.   L2-L3: Slight grade 1 retrolisthesis. Small disc bulge. No significant spinal canal or foraminal stenosis.   L3-L4: Slight grade 1 retrolisthesis. Small disc bulge. No significant spinal canal or foraminal stenosis.   L4-L5: Slight grade 1 anterolisthesis. Slight disc uncovering with disc bulge. Mild to moderate facet arthrosis with ligamentum flavum hypertrophy. No significant spinal canal stenosis. Mild relative bilateral neural foraminal narrowing.    L5-S1: Slight grade 1 anterolisthesis. Slight disc uncovering with disc bulge. Moderate facet arthrosis with ligamentum flavum hypertrophy. No significant spinal canal stenosis. Mild relative bilateral neural foraminal narrowing.   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   At T12-L1, there is a 10 mm central/right subarticular disc extrusion which focally effaces the ventral thecal sac and likely contacts the ventral aspect of the spinal cord.   At L4-L5, there is slight grade 1 anterolisthesis. Slight disc uncovering with disc bulge. Mild-to-moderate facet arthrosis with ligamentum flavum hypertrophy. No significant spinal canal stenosis. Mild relative bilateral neural foraminal narrowing.   At L5-S1, there is slight grade 1 anterolisthesis. Slight disc uncovering with disc bulge. Moderate facet arthrosis with ligamentum flavum hypertrophy. No significant spinal canal stenosis. Mild relative bilateral neural foraminal narrowing.   Mild marrow edema within the posterior elements bilaterally at L4-L5 and L5-S1, likely degenerative and related to facet arthrosis.     Electronically Signed  By: Kellie Simmering D.O.   On: 01/10/2022 14:39  PMFS History: Patient Active Problem List   Diagnosis Date Noted   Chronic hepatitis C without hepatic coma ([place]) 04/21/2021   Left leg pain 04/18/2021   History of ankle fracture 04/18/2021   Tobacco use 04/18/2021   Essential hypertension 03/23/2020   Fatty liver 03/23/2020   Morbid obesity with BMI of 45.0-49.9, adult ([facility]) 03/23/2020   Multiple thyroid nodules 07/14/2018   Past Medical History:  Diagnosis Date   Arthritis    Hypertension     Family History  Problem Relation Age of Onset   Diabetes Mother    Hypertension Mother    Lymphoma Brother     Past Surgical History:  Procedure Laterality Date   ABDOMINAL HYSTERECTOMY     2005    ANKLE FRACTURE SURGERY Left    arthroscopy of left knee  2008   for cartilage surgery   CESAREAN SECTION     2 previous   LEG SURGERY     Social History   Occupational History   Occupation: unemployed    Comment: without health insurance  Tobacco Use   Smoking status: Some Days    Packs/day: 0.10    Types: Cigarettes   Smokeless tobacco: Never   Tobacco comments:    Smokes 2 cigarettes/day - cutting back  Vaping Use   Vaping Use: Never used  Substance and Sexual Activity   Alcohol use: Yes    Comment: occassionally   Drug use: No   Sexual activity: Yes    Birth control/protection: Surgical

## 2022-09-17 NOTE — Progress Notes (Unsigned)
Established Patient Office Visit  Subjective:  Patient ID: Sally Reid, female    DOB: [birth year]  Age: 62 y.o. MRN: [id number]  CC: Primary care follow-up  HPI 07/17/21 Sally Reid presents for primary care follow-up and obesity and hypertension.  On arrival blood pressure is elevated 143/78.  Patient is on hydrochlorothiazide 25 mg daily.  She is skipping lunch eating [employer] drinks for breakfast and baked chicken and salads for dinner.  She now has insurance with [employer].  She missed her orthopedic referral because of her ankle pain because she had a high co-pay.  She smokes 1 cigarette daily still.  She has restless legs at night and does sleeps very fitfully and is very tired fatigue during the day.  She is due up a Pap smear.  She also needs a shingles vaccine she agrees to receive the first 1 at this visit. /27  09/27/21 Patient seen in return follow-up and has been compliant with her blood pressure medication on arrival blood pressure is 136/82.  Patient states her left leg and lower back are improved but she has a follow-up MRI with orthopedics of the lower back.  Sleep study is yet to be performed.  She needs a Pap smear.  She is down to 1 cigarette daily.  She has no other complaints at this visit.  9/27 Patient seen in return follow-up she is down to 2 cigarettes a day she needs a Pap smear she complains of a rash on her left forearm.  She was in the ER recently for cough was given a course of antibiotics this is resolved.  Patient is going to orthopedics today to have her spine injected for chronic pain  There are no other complaints.  On arrival blood pressure is good 128/78. Past Medical History:  Diagnosis Date   Arthritis    Hypertension     Past Surgical History:  Procedure Laterality Date   ABDOMINAL HYSTERECTOMY     2005   ANKLE FRACTURE SURGERY Left    arthroscopy of left knee  2008   for cartilage surgery   CESAREAN SECTION     2 previous   LEG SURGERY       Family History  Problem Relation Age of Onset   Diabetes Mother    Hypertension Mother    Lymphoma Brother     Social History   Socioeconomic History   Marital status: Married    Spouse name: Not on file   Number of children: Not on file   Years of education: Not on file   Highest education level: Not on file  Occupational History   Occupation: unemployed    Comment: without health insurance  Tobacco Use   Smoking status: Some Days    Packs/day: .1    Types: Cigarettes   Smokeless tobacco: Never   Tobacco comments:    Smokes 2 cigarettes/day - cutting back  Vaping Use   Vaping Use: Never used  Substance and Sexual Activity   Alcohol use: Yes    Comment: occassionally   Drug use: No   Sexual activity: Yes    Birth control/protection: Surgical  Other Topics Concern   Not on file  Social History Narrative   Lives in [place].   Had 2 children( two boys 2 and 29).   Living with fiancee.   Social Determinants of Health   Financial Resource Strain: Not on file  Food Insecurity: Not on file  Transportation Needs: Not on file  Physical Activity:  Not on file  Stress: Not on file  Social Connections: Not on file  Intimate Partner Violence: Not on file    Outpatient Medications Prior to Visit  Medication Sig Dispense Refill   Acetaminophen (TYLENOL ARTHRITIS PAIN PO) Take 1 tablet by mouth daily. (Patient not taking: Reported on 05/03/2022)     albuterol (VENTOLIN HFA) 108 (90 Base) MCG/ACT inhaler Inhale 1-2 puffs into the lungs every 6 (six) hours as needed for wheezing or shortness of breath. 1 each 0   Apple Cider Vinegar 500 MG TABS Take 1 each by mouth.     benzonatate (TESSALON) 100 MG capsule Take 1 capsule (100 mg total) by mouth every 8 (eight) hours. 21 capsule 0   chlorthalidone (HYGROTON) 25 MG tablet Take 1 tablet (25 mg total) by mouth daily. 90 tablet 4   clobetasol cream (TEMOVATE) 0.05 % Apply 1 Application topically 2 (two) times daily. To  affected area on left forearm 60 g 1   diazepam (VALIUM) 5 MG tablet Take one tablet by mouth with food one hour prior to procedure. May repeat 30 minutes prior if needed. (Patient not taking: Reported on 03/14/2022) 2 tablet 0   diclofenac Sodium (VOLTAREN) 1 % GEL Apply 2 g topically 4 (four) times daily. To left ankle 50 g 1   potassium chloride (KLOR-CON M) 20 MEQ tablet Take 1 tablet (20 mEq total) by mouth daily. 60 tablet 1   Sofosbuvir-Velpatasvir (EPCLUSA) 400-100 MG TABS Take 1 tablet by mouth daily. (Patient not taking: Reported on 03/14/2022) 28 tablet 2   traMADol (ULTRAM) 50 MG tablet Take 1 tablet (50 mg total) by mouth every 6 (six) hours as needed for moderate pain. 30 tablet 0   No facility-administered medications prior to visit.    No Known Allergies  ROS Review of Systems  Constitutional: Negative.   HENT: Negative.  Negative for ear pain, postnasal drip, rhinorrhea, sinus pressure, sore throat, trouble swallowing and voice change.   Eyes: Negative.   Respiratory: Negative.  Negative for apnea, cough, choking, chest tightness, shortness of breath, wheezing and stridor.   Cardiovascular: Negative.  Negative for chest pain, palpitations and leg swelling.  Gastrointestinal: Negative.  Negative for abdominal distention, abdominal pain, nausea and vomiting.  Genitourinary: Negative.   Musculoskeletal:  Positive for back pain. Negative for arthralgias and myalgias.       Left lower ankle pain  Skin: Negative.  Negative for rash.  Allergic/Immunologic: Negative.  Negative for environmental allergies and food allergies.  Neurological: Negative.  Negative for dizziness, syncope, weakness and headaches.  Hematological: Negative.  Negative for adenopathy. Does not bruise/bleed easily.  Psychiatric/Behavioral: Negative.  Negative for agitation and sleep disturbance. The patient is not nervous/anxious.       Objective:    Physical Exam Vitals reviewed.  Constitutional:       Appearance: Normal appearance. She is well-developed. She is obese. She is not diaphoretic.  HENT:     Head: Normocephalic and atraumatic.     Nose: No nasal deformity, septal deviation, mucosal edema or rhinorrhea.     Right Sinus: No maxillary sinus tenderness or frontal sinus tenderness.     Left Sinus: No maxillary sinus tenderness or frontal sinus tenderness.     Mouth/Throat:     Pharynx: No oropharyngeal exudate.  Eyes:     General: No scleral icterus.    Conjunctiva/sclera: Conjunctivae normal.     Pupils: Pupils are equal, round, and reactive to light.  Neck:  Thyroid: No thyromegaly.     Vascular: No carotid bruit or JVD.     Trachea: Trachea normal. No tracheal tenderness or tracheal deviation.  Cardiovascular:     Rate and Rhythm: Normal rate and regular rhythm.     Chest Wall: PMI is not displaced.     Pulses: Normal pulses. No decreased pulses.     Heart sounds: Normal heart sounds, S1 normal and S2 normal. Heart sounds not distant. No murmur heard.    No systolic murmur is present.     No diastolic murmur is present.     No friction rub. No gallop. No S3 or S4 sounds.  Pulmonary:     Effort: No tachypnea, accessory muscle usage or respiratory distress.     Breath sounds: No stridor. No decreased breath sounds, wheezing, rhonchi or rales.  Chest:     Chest wall: No tenderness.  Abdominal:     General: Bowel sounds are normal. There is no distension.     Palpations: Abdomen is soft. Abdomen is not rigid.     Tenderness: There is no abdominal tenderness. There is no guarding or rebound.  Musculoskeletal:        General: Tenderness present. Normal range of motion.     Cervical back: Normal range of motion and neck supple. No edema, erythema or rigidity. No muscular tenderness. Normal range of motion.     Comments: Lumbar spine tenderness  Lymphadenopathy:     Head:     Right side of head: No submental or submandibular adenopathy.     Left side of head: No  submental or submandibular adenopathy.     Cervical: No cervical adenopathy.  Skin:    General: Skin is warm and dry.     Coloration: Skin is not pale.     Findings: No rash.     Nails: There is no clubbing.  Neurological:     Mental Status: She is alert and oriented to person, place, and time.     Sensory: No sensory deficit.  Psychiatric:        Speech: Speech normal.        Behavior: Behavior normal.     There were no vitals taken for this visit. Wt Readings from Last 3 Encounters:  05/03/22 233 lb 12.8 oz (106.1 kg)  04/30/22 230 lb (104.3 kg)  03/14/22 231 lb (104.8 kg)     Health Maintenance Due  Topic Date Due   COVID-19 Vaccine (4 - 2023-24 season) 02/02/2022   COLON CANCER SCREENING ANNUAL FOBT  04/21/2022    There are no preventive care reminders to display for this patient.  Lab Results  Component Value Date   TSH 0.42 08/25/2018   Lab Results  Component Value Date   WBC 5.4 06/13/2022   HGB 13.5 06/13/2022   HCT 39.7 06/13/2022   MCV 85.9 06/13/2022   PLT 253 06/13/2022   Lab Results  Component Value Date   NA 141 06/13/2022   K 3.5 06/13/2022   CO2 30 06/13/2022   GLUCOSE 90 06/13/2022   BUN 12 06/13/2022   CREATININE 0.83 06/13/2022   BILITOT 0.4 06/13/2022   ALKPHOS 75 04/07/2021   AST 13 06/13/2022   ALT 8 06/13/2022   PROT 6.9 06/13/2022   ALBUMIN 3.5 04/07/2021   CALCIUM 9.7 06/13/2022   ANIONGAP 9 04/30/2022   EGFR 80 06/13/2022   Lab Results  Component Value Date   CHOL  04/10/2009    152  ATP III CLASSIFICATION:  <200     mg/dL   Desirable  119-147  mg/dL   Borderline High  >=829    mg/dL   High          Lab Results  Component Value Date   HDL 53 04/10/2009   Lab Results  Component Value Date   LDLCALC  04/10/2009    81        Total Cholesterol/HDL:CHD Risk Coronary Heart Disease Risk Table                     Men   Women  1/2 Average Risk   3.4   3.3  Average Risk       5.0   4.4  2 X Average Risk   9.6    7.1  3 X Average Risk  23.4   11.0        Use the calculated Patient Ratio above and the CHD Risk Table to determine the patient's CHD Risk.        ATP III CLASSIFICATION (LDL):  <100     mg/dL   Optimal  562-130  mg/dL   Near or Above                    Optimal  130-159  mg/dL   Borderline  865-784  mg/dL   High  >696     mg/dL   Very High   Lab Results  Component Value Date   TRIG 90 04/10/2009   Lab Results  Component Value Date   CHOLHDL 2.9 04/10/2009   Lab Results  Component Value Date   HGBA1C  04/11/2009    5.2 (NOTE) The ADA recommends the following therapeutic goal for glycemic control related to Hgb A1c measurement: Goal of therapy: <6.5 Hgb A1c  Reference: American Diabetes Association: Clinical Practice Recommendations 2010, Diabetes Care, 2010, 33: (Suppl  1).      Assessment & Plan:   Problem List Items Addressed This Visit   None No orders of the defined types were placed in this encounter. Pap smear will be obtained  Follow-up: No follow-ups on file.    Shan Levans, MD

## 2022-09-18 ENCOUNTER — Encounter: Payer: Self-pay | Admitting: Critical Care Medicine

## 2022-09-18 ENCOUNTER — Ambulatory Visit: Payer: Medicaid Other | Attending: Critical Care Medicine | Admitting: Critical Care Medicine

## 2022-09-18 VITALS — BP 119/76 | HR 69

## 2022-09-18 DIAGNOSIS — B182 Chronic viral hepatitis C: Secondary | ICD-10-CM

## 2022-09-18 DIAGNOSIS — K760 Fatty (change of) liver, not elsewhere classified: Secondary | ICD-10-CM

## 2022-09-18 DIAGNOSIS — I10 Essential (primary) hypertension: Secondary | ICD-10-CM | POA: Diagnosis not present

## 2022-09-18 DIAGNOSIS — M79605 Pain in left leg: Secondary | ICD-10-CM

## 2022-09-18 DIAGNOSIS — Z1211 Encounter for screening for malignant neoplasm of colon: Secondary | ICD-10-CM | POA: Diagnosis not present

## 2022-09-18 DIAGNOSIS — Z6841 Body Mass Index (BMI) 40.0 and over, adult: Secondary | ICD-10-CM

## 2022-09-18 DIAGNOSIS — Z720 Tobacco use: Secondary | ICD-10-CM

## 2022-09-18 MED ORDER — CHLORTHALIDONE 25 MG PO TABS: 25.0000 mg | ORAL_TABLET | Freq: Every day | ORAL | 4 refills | Status: DC

## 2022-09-18 MED ORDER — CLOBETASOL PROPIONATE 0.05 % EX CREA: 1.0000 | TOPICAL_CREAM | Freq: Two times a day (BID) | CUTANEOUS | 1 refills | Status: AC

## 2022-09-18 NOTE — Patient Instructions (Signed)
Do knee exercises as able Call Dr Lajoyce Corners to get on schedule for knee and back injections, dr Delford Field will message them as well  Stop potassium, stay on chlorthalidone  Return 2 weeks for labs  Referral for colonoscopy will be sent  Return Dr Delford Field 5 months

## 2022-09-18 NOTE — Assessment & Plan Note (Signed)
Left knee pain refer back to orthopedics for possible injection

## 2022-09-18 NOTE — Assessment & Plan Note (Signed)
  .   Current smoking consumption amount: 3 to 5 cigarettes a day  . Dicsussion on advise to quit smoking and smoking impacts: Cardiovascular impacts  . Patient's willingness to quit: Willing to quit  . Methods to quit smoking discussed: Nicotine replacement  . Medication management of smoking session drugs discussed: Nicotine replacement lozenge  . Resources provided:  AVS   . Setting quit date not established  . Follow-up arranged 3 months   Time spent counseling the patient: 5 minutes  

## 2022-09-18 NOTE — Assessment & Plan Note (Signed)
Resolved off Epclusa

## 2022-09-18 NOTE — Assessment & Plan Note (Signed)
Follow-up metabolic profile 

## 2022-09-18 NOTE — Assessment & Plan Note (Signed)
Continue with lifestyle management approach

## 2022-09-18 NOTE — Assessment & Plan Note (Signed)
Blood pressure at goal on chlorthalidone we will continue same and hold potassium

## 2022-09-19 ENCOUNTER — Ambulatory Visit: Payer: Medicaid Other | Admitting: Family

## 2022-09-26 ENCOUNTER — Ambulatory Visit: Payer: Medicaid Other | Admitting: Family

## 2022-10-03 ENCOUNTER — Ambulatory Visit: Payer: Medicaid Other | Admitting: Family

## 2022-10-04 ENCOUNTER — Encounter: Payer: Self-pay | Admitting: Gastroenterology

## 2022-10-16 ENCOUNTER — Telehealth: Payer: Self-pay | Admitting: Physical Medicine and Rehabilitation

## 2022-10-16 DIAGNOSIS — M5416 Radiculopathy, lumbar region: Secondary | ICD-10-CM

## 2022-10-16 NOTE — Telephone Encounter (Signed)
Patient would like an appointment with Dr. Newton.  

## 2022-10-17 NOTE — Addendum Note (Signed)
Addended by: Sharlet Salina on: 10/17/2022 04:04 PM   Modules accepted: Orders

## 2022-10-17 NOTE — Telephone Encounter (Signed)
Spoke with patient and she is requesting another injection. Last injection was 02/2022 and lasted 3-4 months with about 75% relief. No new falls, accidents or injuries. Please advise

## 2022-10-17 NOTE — Telephone Encounter (Signed)
Order for repeat injection placed 

## 2022-10-24 ENCOUNTER — Ambulatory Visit (AMBULATORY_SURGERY_CENTER): Payer: Medicaid Other

## 2022-10-24 VITALS — Ht 59.0 in | Wt 225.0 lb

## 2022-10-24 DIAGNOSIS — Z800 Family history of malignant neoplasm of digestive organs: Secondary | ICD-10-CM

## 2022-10-24 DIAGNOSIS — Z1211 Encounter for screening for malignant neoplasm of colon: Secondary | ICD-10-CM

## 2022-10-24 MED ORDER — PEG 3350-KCL-NA BICARB-NACL 420 G PO SOLR: 4000.0000 mL | Freq: Once | ORAL | 0 refills | Status: AC

## 2022-10-24 NOTE — Progress Notes (Signed)
Pre visit completed via phone call; Patient verified name, DOB, and address;  No egg or soy allergy known to patient; No issues known to pt with past sedation with any surgeries or procedures; Patient denies ever being told they had issues or difficulty with intubation;  No FH of Malignant Hyperthermia; Pt is not on diet pills; Pt is not on home 02;  Pt is not on blood thinners;  Pt reports issues with constipation - patient reports she takes Miralax once per week;  patient advised to increase oral fluids, activity, and fruits/veggies, and can take Miralax daily for relief of constipation;  No A fib or A flutter; Have any cardiac testing pending--NO Pt instructed to use Singlecare.com or GoodRx for a price reduction on prep;   Insurance verified during PV appt=[place] [employer]  Patient's chart reviewed by Cathlyn Parsons CNRA prior to previsit and patient appropriate for the [facility].  Previsit completed and red dot placed by patient's name on their procedure day (on provider's schedule).    Instructions printed and placed at the front desk on the second floor for patient to pick up today;

## 2022-11-06 ENCOUNTER — Other Ambulatory Visit: Payer: Self-pay | Admitting: Orthopedic Surgery

## 2022-11-06 ENCOUNTER — Other Ambulatory Visit: Payer: Self-pay | Admitting: Physical Medicine and Rehabilitation

## 2022-11-06 ENCOUNTER — Telehealth: Payer: Self-pay | Admitting: Orthopedic Surgery

## 2022-11-06 ENCOUNTER — Telehealth: Payer: Self-pay | Admitting: Physical Medicine and Rehabilitation

## 2022-11-06 MED ORDER — TRAMADOL HCL 50 MG PO TABS: 50.0000 mg | ORAL_TABLET | Freq: Four times a day (QID) | ORAL | 0 refills | Status: DC | PRN

## 2022-11-06 MED ORDER — DIAZEPAM 5 MG PO TABS: ORAL_TABLET | ORAL | 0 refills | Status: DC

## 2022-11-06 NOTE — Telephone Encounter (Signed)
Patient scheduled for injection tomorrow and needs pre procedure Valium sent to [facility]

## 2022-11-06 NOTE — Telephone Encounter (Signed)
Pt called requesting tramadol for pain. Pt has an upcoming appt 6/11. Please send to pharmacy on file. Pt phone number is [phone].

## 2022-11-06 NOTE — Telephone Encounter (Signed)
Pt called stating she has an appt with Dr Alvester Morin tomorrow and was sent medication last visit before injection. Please call pt about this matter at [phone]

## 2022-11-06 NOTE — Telephone Encounter (Signed)
Pt requesting refill for tramadol last filled 12/2021 #30. Lower back pain. Does f/u with [facility] periodically for injections.

## 2022-11-07 ENCOUNTER — Other Ambulatory Visit: Payer: Self-pay | Admitting: Orthopedic Surgery

## 2022-11-07 ENCOUNTER — Ambulatory Visit: Payer: Medicaid Other | Admitting: Physical Medicine and Rehabilitation

## 2022-11-07 ENCOUNTER — Other Ambulatory Visit: Payer: Self-pay

## 2022-11-07 VITALS — BP 149/75 | HR 81

## 2022-11-07 DIAGNOSIS — M5416 Radiculopathy, lumbar region: Secondary | ICD-10-CM | POA: Diagnosis not present

## 2022-11-07 MED ORDER — METHYLPREDNISOLONE ACETATE 80 MG/ML IJ SUSP
80.0000 mg | Freq: Once | INTRAMUSCULAR | Status: AC
  Administered 2022-11-07: 80 mg

## 2022-11-07 NOTE — Patient Instructions (Signed)

## 2022-11-07 NOTE — Progress Notes (Signed)
Functional Pain Scale - descriptive words and definitions  Distressing (6)    Pain is present/unable to complete most ADLs limited by pain/sleep is difficult and active distraction is only marginal. Moderate range order  Average Pain 7   +Driver, -BT, -Dye Allergies.  Lower back pain on left side that radiates into left leg. Walking and sleeping are difficult

## 2022-11-08 ENCOUNTER — Other Ambulatory Visit: Payer: Self-pay | Admitting: Orthopedic Surgery

## 2022-11-09 NOTE — Procedures (Signed)
Lumbar Epidural Steroid Injection - Interlaminar Approach with Fluoroscopic Guidance  [name]      Date of Birth: 21-Jun-1960 MRN: [id number] PCP: Storm Frisk, MD      Visit Date: 11/07/2022   Universal Protocol:     Consent Given By: the patient  Position: PRONE  Additional Comments: Vital signs were monitored before and after the procedure. Patient was prepped and draped in the usual sterile fashion. The correct patient, procedure, and site was verified.   Injection Procedure Details:   Procedure diagnoses: Lumbar radiculopathy [M54.16]   Meds Administered:  Meds ordered this encounter  Medications   methylPREDNISolone acetate (DEPO-MEDROL) injection 80 mg     Laterality: Left  Location/Site:  L5-S1  Needle: 4.5 in., 20 ga. Tuohy  Needle Placement: Paramedian epidural  Findings:   -Comments: Excellent flow of contrast into the epidural space.  Procedure Details: Using a paramedian approach from the side mentioned above, the region overlying the inferior lamina was localized under fluoroscopic visualization and the soft tissues overlying this structure were infiltrated with 4 ml. of 1% Lidocaine without Epinephrine. The Tuohy needle was inserted into the epidural space using a paramedian approach.   The epidural space was localized using loss of resistance along with counter oblique bi-planar fluoroscopic views.  After negative aspirate for air, blood, and CSF, a 2 ml. volume of Isovue-250 was injected into the epidural space and the flow of contrast was observed. Radiographs were obtained for documentation purposes.    The injectate was administered into the level noted above.   Additional Comments:  No complications occurred Dressing: 2 x 2 sterile gauze and Band-Aid    Post-procedure details: Patient was observed during the procedure. Post-procedure instructions were reviewed.  Patient left the clinic in stable condition.

## 2022-11-09 NOTE — Progress Notes (Signed)
Sally Reid - 62 y.o. female MRN [id number]  Date of birth: 11-28-1960  Office Visit Note: Visit Date: 11/07/2022 PCP: Storm Frisk, MD Referred by: Storm Frisk, MD  Subjective: Chief Complaint  Patient presents with   Lower Back - Pain   HPI:  Sally Reid is a 62 y.o. female who comes in today at the request of Ellin Goodie, FNP for planned Left L5-S1 Lumbar Interlaminar epidural steroid injection with fluoroscopic guidance.  The patient has failed conservative care including home exercise, medications, time and activity modification.  This injection will be diagnostic and hopefully therapeutic.  Please see requesting physician notes for further details and justification.   ROS Otherwise per HPI.  Assessment & Plan: Visit Diagnoses:    ICD-10-CM   1. Lumbar radiculopathy  M54.16 XR C-ARM NO REPORT    Epidural Steroid injection    methylPREDNISolone acetate (DEPO-MEDROL) injection 80 mg      Plan: No additional findings.   Meds & Orders:  Meds ordered this encounter  Medications   methylPREDNISolone acetate (DEPO-MEDROL) injection 80 mg    Orders Placed This Encounter  Procedures   XR C-ARM NO REPORT   Epidural Steroid injection    Follow-up: Return for visit to requesting provider as needed.   Procedures: No procedures performed  Lumbar Epidural Steroid Injection - Interlaminar Approach with Fluoroscopic Guidance  [name]      Date of Birth: [birth year] MRN: [id number] PCP: Storm Frisk, MD      Visit Date: 11/07/2022   Universal Protocol:     Consent Given By: the patient  Position: PRONE  Additional Comments: Vital signs were monitored before and after the procedure. Patient was prepped and draped in the usual sterile fashion. The correct patient, procedure, and site was verified.   Injection Procedure Details:   Procedure diagnoses: Lumbar radiculopathy [M54.16]   Meds Administered:  Meds ordered this  encounter  Medications   methylPREDNISolone acetate (DEPO-MEDROL) injection 80 mg     Laterality: Left  Location/Site:  L5-S1  Needle: 4.5 in., 20 ga. Tuohy  Needle Placement: Paramedian epidural  Findings:   -Comments: Excellent flow of contrast into the epidural space.  Procedure Details: Using a paramedian approach from the side mentioned above, the region overlying the inferior lamina was localized under fluoroscopic visualization and the soft tissues overlying this structure were infiltrated with 4 ml. of 1% Lidocaine without Epinephrine. The Tuohy needle was inserted into the epidural space using a paramedian approach.   The epidural space was localized using loss of resistance along with counter oblique bi-planar fluoroscopic views.  After negative aspirate for air, blood, and CSF, a 2 ml. volume of Isovue-250 was injected into the epidural space and the flow of contrast was observed. Radiographs were obtained for documentation purposes.    The injectate was administered into the level noted above.   Additional Comments:  No complications occurred Dressing: 2 x 2 sterile gauze and Band-Aid    Post-procedure details: Patient was observed during the procedure. Post-procedure instructions were reviewed.  Patient left the clinic in stable condition.   Clinical History: EXAM: MRI LUMBAR SPINE WITHOUT CONTRAST   TECHNIQUE: Multiplanar, multisequence MR imaging of the lumbar spine was performed. No intravenous contrast was administered.   COMPARISON:  Radiographs of the lumbar spine 08/21/2021 (images available, report unavailable).   FINDINGS: Segmentation: 5 lumbar vertebrae. The caudal most well-formed into the vol disc space is designated L5-S1.   Alignment: Slight grade 1 retrolisthesis  at L2-L3 and L3-L4. Slight grade 1 anterolisthesis at L4-L5 and L5-S1.   Vertebrae: Vertebral body height is maintained. Mild marrow edema within the posterior elements  bilaterally at L4-L5 and L5-S1, likely degenerative and related to facet arthrosis. Minimal degenerative endplate edema at Z61-W96, T11-T12, T12-L1 and L5-S1.   Conus medullaris and cauda equina: Conus extends to the L1-L2 level. No signal abnormality identified within the visualized distal spinal cord.   Paraspinal and other soft tissues: No abnormality identified within included portions of the abdomen/retroperitoneum. No paraspinal mass or collection.   Disc levels:   No more than mild disc degeneration.   T10-T11: This level is imaged in the sagittal plane only. Slight disc bulge. No significant spinal canal or foraminal stenosis.   T11-T12: This level is imaged in the sagittal plane only. Small central disc protrusion. Mild effacement of ventral thecal sac (without spinal cord mass effect). No significant foraminal stenosis.   T12-L1: 10 mm central/right subarticular disc extrusion. The disc extrusion focally effaces the ventral thecal sac and likely contacts the ventral aspect of the spinal cord. The dorsal CSF space is maintained within the spinal canal. No significant foraminal stenosis.   L1-L2: Central posterior annular fissure. Small disc bulge. No significant spinal canal or foraminal stenosis.   L2-L3: Slight grade 1 retrolisthesis. Small disc bulge. No significant spinal canal or foraminal stenosis.   L3-L4: Slight grade 1 retrolisthesis. Small disc bulge. No significant spinal canal or foraminal stenosis.   L4-L5: Slight grade 1 anterolisthesis. Slight disc uncovering with disc bulge. Mild to moderate facet arthrosis with ligamentum flavum hypertrophy. No significant spinal canal stenosis. Mild relative bilateral neural foraminal narrowing.   L5-S1: Slight grade 1 anterolisthesis. Slight disc uncovering with disc bulge. Moderate facet arthrosis with ligamentum flavum hypertrophy. No significant spinal canal stenosis. Mild relative bilateral neural  foraminal narrowing.   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   At T12-L1, there is a 10 mm central/right subarticular disc extrusion which focally effaces the ventral thecal sac and likely contacts the ventral aspect of the spinal cord.   At L4-L5, there is slight grade 1 anterolisthesis. Slight disc uncovering with disc bulge. Mild-to-moderate facet arthrosis with ligamentum flavum hypertrophy. No significant spinal canal stenosis. Mild relative bilateral neural foraminal narrowing.   At L5-S1, there is slight grade 1 anterolisthesis. Slight disc uncovering with disc bulge. Moderate facet arthrosis with ligamentum flavum hypertrophy. No significant spinal canal stenosis. Mild relative bilateral neural foraminal narrowing.   Mild marrow edema within the posterior elements bilaterally at L4-L5 and L5-S1, likely degenerative and related to facet arthrosis.     Electronically Signed   By: Jackey Loge D.O.   On: 01/10/2022 14:39     Objective:  VS:  HT:    WT:   BMI:     BP:(!) 149/75  HR:81bpm  TEMP: ( )  RESP:  Physical Exam Vitals and nursing note reviewed.  Constitutional:      General: She is not in acute distress.    Appearance: Normal appearance. She is not ill-appearing.  HENT:     Head: Normocephalic and atraumatic.     Right Ear: External ear normal.     Left Ear: External ear normal.  Eyes:     Extraocular Movements: Extraocular movements intact.  Cardiovascular:     Rate and Rhythm: Normal rate.     Pulses: Normal pulses.  Pulmonary:  Effort: Pulmonary effort is normal. No respiratory distress.  Abdominal:     General: There is no distension.     Palpations: Abdomen is soft.  Musculoskeletal:        General: Tenderness present.     Cervical back: Neck supple.     Right lower leg: No edema.     Left lower leg: No edema.     Comments: Patient has good  distal strength with no pain over the greater trochanters.  No clonus or focal weakness.  Skin:    Findings: No erythema, lesion or rash.  Neurological:     General: No focal deficit present.     Mental Status: She is alert and oriented to person, place, and time.     Sensory: No sensory deficit.     Motor: No weakness or abnormal muscle tone.     Coordination: Coordination normal.  Psychiatric:        Mood and Affect: Mood normal.        Behavior: Behavior normal.      Imaging: No results found.

## 2022-11-13 ENCOUNTER — Ambulatory Visit: Payer: Medicaid Other | Admitting: Orthopedic Surgery

## 2022-11-21 ENCOUNTER — Encounter: Payer: Medicaid Other | Admitting: Gastroenterology

## 2022-11-21 ENCOUNTER — Telehealth: Payer: Self-pay | Admitting: Gastroenterology

## 2022-11-21 NOTE — Telephone Encounter (Signed)
Good Morning Dr. Tomasa Rand,  I called this patient at 8:15 am this morning and she stated she called at around 7:00 am today  and spoke to someone and explained that she was sick from the 1 dose of prep vomiting and feeling terrrible. Could not finish prep. She canceled appointment and will call back to reschedule.

## 2022-11-22 ENCOUNTER — Ambulatory Visit: Payer: Medicaid Other | Admitting: Orthopedic Surgery

## 2022-11-22 ENCOUNTER — Other Ambulatory Visit (INDEPENDENT_AMBULATORY_CARE_PROVIDER_SITE_OTHER): Payer: Medicaid Other

## 2022-11-22 DIAGNOSIS — M25511 Pain in right shoulder: Secondary | ICD-10-CM | POA: Diagnosis not present

## 2022-11-22 DIAGNOSIS — G8929 Other chronic pain: Secondary | ICD-10-CM | POA: Diagnosis not present

## 2022-12-10 ENCOUNTER — Encounter: Payer: Self-pay | Admitting: Orthopedic Surgery

## 2022-12-10 NOTE — Progress Notes (Signed)
Office Visit Note   Patient: Navi Polis           Date of Birth: 13-Jan-1961           MRN: [id number] Visit Date: 11/22/2022              Requested by: Storm Frisk, MD [address] [place],  [place] [address] PCP: Storm Frisk, MD  Chief Complaint  Patient presents with   Right Shoulder - Pain      HPI: Patient is a 62 year old woman who presents with 56-month history of right shoulder pain without injury.  She states she has pain with internal rotation has pain at night.  Assessment & Plan: Visit Diagnoses:  1. Chronic right shoulder pain     Plan: Recommended Voltaren gel topically.  Discussed that if she does not obtain sufficient relief with Voltaren gel we could proceed with a subacromial injection at follow-up.  Follow-Up Instructions: Return if symptoms worsen or fail to improve.   Ortho Exam  Patient is alert, oriented, no adenopathy, well-dressed, normal affect, normal respiratory effort. Examination patient has pain with Neer and Hawkins impingement test she has pain to palpation over the biceps tendon.  Patient does not want to consider a subacromial injection at this time.  Imaging: No results found. No images are attached to the encounter.  Labs: Lab Results  Component Value Date   HGBA1C  04/11/2009    5.2 (NOTE) The ADA recommends the following therapeutic goal for glycemic control related to Hgb A1c measurement: Goal of therapy: <6.5 Hgb A1c  Reference: American Diabetes Association: Clinical Practice Recommendations 2010, Diabetes Care, 2010, 33: (Suppl  1).   REPTSTATUS 03/15/2020 FINAL 03/14/2020   CULT (A) 03/14/2020    <10,000 COLONIES/mL INSIGNIFICANT GROWTH Performed at [facility], [address], [place], [place] [address]      Lab Results  Component Value Date   ALBUMIN 3.5 04/07/2021   ALBUMIN 3.5 03/14/2020   ALBUMIN 3.6 11/28/2015    Lab Results  Component Value Date   MG 2.0 04/30/2022   MG  1.9 04/11/2009   No results found for: "VD25OH"  No results found for: "PREALBUMIN"    Latest Ref Rng & Units 06/13/2022    9:24 AM 04/30/2022    5:29 AM 11/15/2021    2:19 PM  CBC EXTENDED  WBC 3.8 - 10.8 Thousand/uL 5.4  9.8  6.9   RBC 3.80 - 5.10 Million/uL 4.62  4.69  4.75   Hemoglobin 11.7 - 15.5 g/dL 82.9  56.2  13.0   HCT 35.0 - 45.0 % 39.7  40.8  40.9   Platelets 140 - 400 Thousand/uL 253  235  273      There is no height or weight on file to calculate BMI.  Orders:  Orders Placed This Encounter  Procedures   XR Shoulder Right   No orders of the defined types were placed in this encounter.    Procedures: No procedures performed  Clinical Data: No additional findings.  ROS:  All other systems negative, except as noted in the HPI. Review of Systems  Objective: Vital Signs: There were no vitals taken for this visit.  Specialty Comments:  EXAM: MRI LUMBAR SPINE WITHOUT CONTRAST   TECHNIQUE: Multiplanar, multisequence MR imaging of the lumbar spine was performed. No intravenous contrast was administered.   COMPARISON:  Radiographs of the lumbar spine 08/21/2021 (images available, report unavailable).   FINDINGS: Segmentation: 5 lumbar  vertebrae. The caudal most well-formed into the vol disc space is designated L5-S1.   Alignment: Slight grade 1 retrolisthesis at L2-L3 and L3-L4. Slight grade 1 anterolisthesis at L4-L5 and L5-S1.   Vertebrae: Vertebral body height is maintained. Mild marrow edema within the posterior elements bilaterally at L4-L5 and L5-S1, likely degenerative and related to facet arthrosis. Minimal degenerative endplate edema at W29-F62, T11-T12, T12-L1 and L5-S1.   Conus medullaris and cauda equina: Conus extends to the L1-L2 level. No signal abnormality identified within the visualized distal spinal cord.   Paraspinal and other soft tissues: No abnormality identified within included portions of the abdomen/retroperitoneum. No  paraspinal mass or collection.   Disc levels:   No more than mild disc degeneration.   T10-T11: This level is imaged in the sagittal plane only. Slight disc bulge. No significant spinal canal or foraminal stenosis.   T11-T12: This level is imaged in the sagittal plane only. Small central disc protrusion. Mild effacement of ventral thecal sac (without spinal cord mass effect). No significant foraminal stenosis.   T12-L1: 10 mm central/right subarticular disc extrusion. The disc extrusion focally effaces the ventral thecal sac and likely contacts the ventral aspect of the spinal cord. The dorsal CSF space is maintained within the spinal canal. No significant foraminal stenosis.   L1-L2: Central posterior annular fissure. Small disc bulge. No significant spinal canal or foraminal stenosis.   L2-L3: Slight grade 1 retrolisthesis. Small disc bulge. No significant spinal canal or foraminal stenosis.   L3-L4: Slight grade 1 retrolisthesis. Small disc bulge. No significant spinal canal or foraminal stenosis.   L4-L5: Slight grade 1 anterolisthesis. Slight disc uncovering with disc bulge. Mild to moderate facet arthrosis with ligamentum flavum hypertrophy. No significant spinal canal stenosis. Mild relative bilateral neural foraminal narrowing.   L5-S1: Slight grade 1 anterolisthesis. Slight disc uncovering with disc bulge. Moderate facet arthrosis with ligamentum flavum hypertrophy. No significant spinal canal stenosis. Mild relative bilateral neural foraminal narrowing.   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   At T12-L1, there is a 10 mm central/right subarticular disc extrusion which focally effaces the ventral thecal sac and likely contacts the ventral aspect of the spinal cord.   At L4-L5, there is slight grade 1 anterolisthesis. Slight disc uncovering with disc bulge.  Mild-to-moderate facet arthrosis with ligamentum flavum hypertrophy. No significant spinal canal stenosis. Mild relative bilateral neural foraminal narrowing.   At L5-S1, there is slight grade 1 anterolisthesis. Slight disc uncovering with disc bulge. Moderate facet arthrosis with ligamentum flavum hypertrophy. No significant spinal canal stenosis. Mild relative bilateral neural foraminal narrowing.   Mild marrow edema within the posterior elements bilaterally at L4-L5 and L5-S1, likely degenerative and related to facet arthrosis.     Electronically Signed   By: Jackey Loge D.O.   On: 01/10/2022 14:39  PMFS History: Patient Active Problem List   Diagnosis Date Noted   Chronic hepatitis C without hepatic coma (HCC) 04/21/2021   Left leg pain 04/18/2021   History of ankle fracture 04/18/2021   Tobacco use 04/18/2021   Essential hypertension 03/23/2020   Fatty liver 03/23/2020   Morbid obesity with BMI of 45.0-49.9, adult (HCC) 03/23/2020   Multiple thyroid nodules 07/14/2018   Past Medical History:  Diagnosis Date   Arthritis    back/R shoulder   Hypertension    on meds    Family History  Problem Relation Age of Onset   Stomach  cancer Mother 40   Diabetes Mother    Hypertension Mother    Hodgkin's lymphoma Brother 49   Colon polyps Neg Hx    Colon cancer Neg Hx    Esophageal cancer Neg Hx    Rectal cancer Neg Hx     Past Surgical History:  Procedure Laterality Date   ABDOMINAL HYSTERECTOMY  2005   ANKLE FRACTURE SURGERY Left 2011   CESAREAN SECTION     2 previous   KNEE ARTHROSCOPY Left 2008   for cartilage repair   LEG SURGERY  2011   TUBAL LIGATION     Social History   Occupational History   Occupation: unemployed    Comment: without health insurance  Tobacco Use   Smoking status: Some Days    Packs/day: .1    Types: Cigarettes   Smokeless tobacco: Never   Tobacco comments:    Smokes 2 cigarettes/day - cutting back  Vaping Use   Vaping Use:  Never used  Substance and Sexual Activity   Alcohol use: Yes    Alcohol/week: 3.0 standard drinks of alcohol    Types: 3 Standard drinks or equivalent per week    Comment: occassionally   Drug use: No   Sexual activity: Yes    Birth control/protection: Surgical

## 2022-12-19 ENCOUNTER — Ambulatory Visit: Payer: Self-pay | Admitting: *Deleted

## 2022-12-19 NOTE — Telephone Encounter (Signed)
Summary: constipation / knot under her right arm   Pt stated she has a knot under her right arm. Since taking the drink for her colonoscopy about a month ago, she has been having problems with her bowel's movements, constipation, and some abdominal pain once a week. Hasn't had abdominal pain this week.  No appointments. Seeking clinical advice.       Chief Complaint: bump/cyst underarm- R Symptoms: small pea sized bump- appeared under arm- at first was painful- but now no pain, skin color normal Frequency: 1 week Pertinent Negatives: Patient denies pain, itching, fever Disposition: [] ED /[] Urgent Care (no appt availability in office) / [] Appointment(In office/virtual)/ []  [facility]/ [] Home Care/ [] Refused Recommended Disposition /[x]  Mobile Bus/ []  Follow-up with PCP Additional Notes: no open appointment within disposition- patient advised of mobile unit option - she states she may go Tuesday. Patient advised to contact GI provider she was scheduled with to reschedule colonoscopy- they have other prep options- please follow up- especially with new onset constipation. Will send note for PCP review  Reason for Disposition  [1] Small swelling or lump AND [2] unexplained AND [3] present > 1 week  [1] Constipation persists > 1 week AND [2] no improvement after using Care Advice  Answer Assessment - Initial Assessment Questions 1. APPEARANCE of SWELLING: "What does it look like?"     Small rising under the skin- sore at first 2. SIZE: "How large is the swelling?" (e.g., inches, cm; or compare to size of pinhead, tip of pen, eraser, coin, pea, grape, ping pong ball)      Pea size 3. LOCATION: "Where is the swelling located?"     Underarm area 4. ONSET: "When did the swelling start?"     1 week 5. COLOR: "What color is it?" "Is there more than one color?"     Normal skin color 6. PAIN: "Is there any pain?" If Yes, ask: "How bad is the pain?" (e.g., scale 1-10; or mild,  moderate, severe)     - NONE (0): no pain   - MILD (1-3): doesn't interfere with normal activities    - MODERATE (4-7): interferes with normal activities or awakens from sleep    - SEVERE (8-10): excruciating pain, unable to do any normal activities     Started out sore- but not now 7. ITCH: "Does it itch?" If Yes, ask: "How bad is the itch?"      no 8. CAUSE: "What do you think caused the swelling?"       unsure 9 OTHER SYMPTOMS: "Do you have any other symptoms?" (e.g., fever)     no  Answer Assessment - Initial Assessment Questions 1. STOOL PATTERN OR FREQUENCY: "How often do you have a bowel movement (BM)?"  (Normal range: 3 times a day to every 3 days)  "When was your last BM?"       Normally- once daily-now patient feels she has to force bowels- 6 months 2. STRAINING: "Do you have to strain to have a BM?"      yes 3. RECTAL PAIN: "Does your rectum hurt when the stool comes out?" If Yes, ask: "Do you have hemorrhoids? How bad is the pain?"  (Scale 1-10; or mild, moderate, severe)     Sometimes 4. STOOL COMPOSITION: "Are the stools hard?"      hard 5. BLOOD ON STOOLS: "Has there been any blood on the toilet tissue or on the surface of the BM?" If Yes, ask: "When was the  last time?"     no 6. CHRONIC CONSTIPATION: "Is this a new problem for you?"  If No, ask: "How long have you had this problem?" (days, weeks, months)      no 7. CHANGES IN DIET OR HYDRATION: "Have there been any recent changes in your diet?" "How much fluids are you drinking on a daily basis?"  "How much have you had to drink today?"     No- patient admits she does not drink alot 8. MEDICINES: "Have you been taking any new medicines?" "Are you taking any narcotic pain medicines?" (e.g., Dilaudid, morphine, Percocet, Vicodin)     Tramadol- but she had this before 9. LAXATIVES: "Have you been using any stool softeners, laxatives, or enemas?"  If Yes, ask "What, how often, and when was the last time?"     In the past-  tried miralax and probiotic 10. ACTIVITY:  "How much walking do you do every day?"  "Has your activity level decreased in the past week?"        Patient is active- she works 11. CAUSE: "What do you think is causing the constipation?"        Not sure constipation 12. OTHER SYMPTOMS: "Do you have any other symptoms?" (e.g., abdomen pain, bloating, fever, vomiting)       Abdominal discomfort at times- last BM- yesterday evening 13. MEDICAL HISTORY: "Do you have a history of hemorrhoids, rectal fissures, or rectal surgery or rectal abscess?"         no  Protocols used: Skin Lump or Localized Swelling-A-AH, Constipation-A-AH

## 2022-12-19 NOTE — Telephone Encounter (Signed)
 Noted  

## 2022-12-25 ENCOUNTER — Ambulatory Visit: Payer: Medicaid Other | Admitting: Physician Assistant

## 2022-12-25 ENCOUNTER — Encounter: Payer: Self-pay | Admitting: Physician Assistant

## 2022-12-25 VITALS — BP 179/78 | HR 64 | Ht 59.0 in | Wt 225.0 lb

## 2022-12-25 DIAGNOSIS — M25511 Pain in right shoulder: Secondary | ICD-10-CM

## 2022-12-25 DIAGNOSIS — L02421 Furuncle of right axilla: Secondary | ICD-10-CM | POA: Diagnosis not present

## 2022-12-25 DIAGNOSIS — I10 Essential (primary) hypertension: Secondary | ICD-10-CM

## 2022-12-25 MED ORDER — DOXYCYCLINE HYCLATE 100 MG PO CAPS: 100.0000 mg | ORAL_CAPSULE | Freq: Two times a day (BID) | ORAL | 0 refills | Status: DC

## 2022-12-25 NOTE — Progress Notes (Unsigned)
Established Patient Office Visit  Subjective   Patient ID: Sally Reid, female    DOB: December 11, 1960  Age: 62 y.o. MRN: [id number]  Chief Complaint  Patient presents with   Shoulder Pain    Right shoulder, pain radiates to neck. She has also noticed two knots located in her underarm. She explains the knots are painful. Pain score of 9    States that she continues to have pain in her right shoulder, states that this has been ongoing.  States that she did see orthopedics on November 22, 2022.  Note from that visit    HPI: Patient is a 62 year old woman who presents with 13-month history of right shoulder pain without injury.  She states she has pain with internal rotation has pain at night.   Assessment & Plan: Visit Diagnoses:  1. Chronic right shoulder pain      Plan: Recommended Voltaren gel topically.  Discussed that if she does not obtain sufficient relief with Voltaren gel we could proceed with a subacromial injection at follow-up.   Follow-Up Instructions: Return if symptoms worsen or fail to improve.     States today that she declined to have injection in her right shoulder and has been using the Voltaren without much relief.  States that he also prescribed tramadol and states that she has used that at night in order to get rest  States that she also had a tender nodule in her right axilla approximately 2 weeks ago, states that it did seem to resolve but then another 1 came up a couple of days ago.  States that it is also tender.  Denies drainage.  States that she has not tried anything for relief.  States that she does check her blood pressure at home on a mostly daily basis states that her readings are generally 140/80 and 120/80.  Denies hypertensive symptoms.  Past Medical History:  Diagnosis Date   Arthritis    back/R shoulder   Hypertension    on meds   Social History   Socioeconomic History   Marital status: Married    Spouse name: Not on file   Number of  children: Not on file   Years of education: Not on file   Highest education level: Not on file  Occupational History   Occupation: unemployed    Comment: without health insurance  Tobacco Use   Smoking status: Some Days    Current packs/day: 0.10    Types: Cigarettes   Smokeless tobacco: Never   Tobacco comments:    Smokes 2 cigarettes/day - cutting back  Vaping Use   Vaping status: Never Used  Substance and Sexual Activity   Alcohol use: Yes    Alcohol/week: 3.0 standard drinks of alcohol    Types: 3 Standard drinks or equivalent per week    Comment: occassionally   Drug use: No   Sexual activity: Yes    Birth control/protection: Surgical  Other Topics Concern   Not on file  Social History Narrative   Lives in [place].   Had 2 children( two boys 6 and 29).   Living with fiancee.   Social Determinants of Health   Financial Resource Strain: Not on file  Food Insecurity: Not on file  Transportation Needs: Not on file  Physical Activity: Not on file  Stress: Not on file  Social Connections: Not on file  Intimate Partner Violence: Not on file   Family History  Problem Relation Age of Onset   Stomach cancer Mother  20   Diabetes Mother    Hypertension Mother    Hodgkin's lymphoma Brother 17   Colon polyps Neg Hx    Colon cancer Neg Hx    Esophageal cancer Neg Hx    Rectal cancer Neg Hx    No Known Allergies  Review of Systems  Constitutional: Negative.   HENT: Negative.    Eyes: Negative.   Respiratory:  Negative for shortness of breath.   Cardiovascular:  Negative for chest pain.  Gastrointestinal: Negative.   Genitourinary: Negative.   Musculoskeletal:  Positive for joint pain.  Skin: Negative.   Neurological: Negative.   Endo/Heme/Allergies: Negative.   Psychiatric/Behavioral: Negative.        Objective:     BP (!) 179/78 (BP Location: Left Arm, Patient Position: Sitting, Cuff Size: Large)   Pulse 64   Ht 4\' 11"  (1.499 m)   Wt 225 lb (102.1  kg)   SpO2 99%   BMI 45.44 kg/m  BP Readings from Last 3 Encounters:  12/25/22 (!) 179/78  11/07/22 (!) 149/75  09/18/22 119/76   Wt Readings from Last 3 Encounters:  12/25/22 225 lb (102.1 kg)  10/24/22 225 lb (102.1 kg)  05/03/22 233 lb 12.8 oz (106.1 kg)      Physical Exam Vitals and nursing note reviewed.  Constitutional:      Appearance: Normal appearance.  HENT:     Head: Normocephalic and atraumatic.     Right Ear: External ear normal.     Left Ear: External ear normal.     Nose: Nose normal.     Mouth/Throat:     Mouth: Mucous membranes are moist.     Pharynx: Oropharynx is clear.  Eyes:     Extraocular Movements: Extraocular movements intact.     Conjunctiva/sclera: Conjunctivae normal.     Pupils: Pupils are equal, round, and reactive to light.  Cardiovascular:     Rate and Rhythm: Normal rate and regular rhythm.     Pulses: Normal pulses.     Heart sounds: Normal heart sounds.  Pulmonary:     Effort: Pulmonary effort is normal.     Breath sounds: Normal breath sounds.  Musculoskeletal:     Right shoulder: Bony tenderness present. No swelling. Decreased range of motion.     Left shoulder: Normal.     Cervical back: Normal range of motion and neck supple.  Skin:    General: Skin is warm and dry.     Comments: 2 mobile pea size tender subcutaneous nodules noted right axilla  Neurological:     General: No focal deficit present.     Mental Status: She is alert and oriented to person, place, and time.  Psychiatric:        Mood and Affect: Mood normal.        Behavior: Behavior normal.        Thought Content: Thought content normal.        Judgment: Judgment normal.        Assessment & Plan:   Problem List Items Addressed This Visit   None Visit Diagnoses     Furuncle of right axilla    -  Primary   Relevant Medications   doxycycline (VIBRAMYCIN) 100 MG capsule   Elevated blood pressure reading in office with diagnosis of hypertension        Acute pain of right shoulder          1. Furuncle of right axilla Trial doxycycline.  Patient education given  on supportive care.  Red flags given for prompt reevaluation. - doxycycline (VIBRAMYCIN) 100 MG capsule; Take 1 capsule (100 mg total) by mouth 2 (two) times daily.  Dispense: 20 capsule; Refill: 0  2. Elevated blood pressure reading in office with diagnosis of hypertension Patient encouraged to continue checking blood pressure at home, keeping a written log and having available for all office visits.  Patient encouraged to return to the mobile unit or follow-up with primary care provider if blood pressure readings remain elevated.  Patient understands and agrees.  3. Acute pain of right shoulder Encouraged patient to continue follow-up with orthopedics.   I have reviewed the patient's medical history (PMH, PSH, Social History, Family History, Medications, and allergies) , and have been updated if relevant. I spent 30 minutes reviewing chart and  face to face time with patient.    Return if symptoms worsen or fail to improve.    Kasandra Knudsen Mayers, PA-C

## 2022-12-25 NOTE — Patient Instructions (Signed)
You are going to take doxycycline twice a day for 10 days.  Your blood pressure is elevated today, I do encourage you to check your blood pressure on a daily basis, keep a written log and have available for all office visits.  If your blood pressure continues to be elevated, please feel free to return to the mobile unit or follow-up with your primary care provider.  I do encourage you to continue follow-up with orthopedics for your shoulder pain.  I hope that you feel better soon, please let us know if there is anything else we can do for you.  Roney Jaffe, PA-C Physician Assistant [facility] [email or web address]   Skin Abscess  A skin abscess is an infected area on or under your skin. It contains pus and other material. An abscess may also be called a furuncle, carbuncle, or boil. It is often the result of an infection caused by bacteria. An abscess can occur in or on almost any part of your body. Sometimes, an abscess may break open (rupture) on its own. In most cases, it will keep getting worse unless it is treated. An abscess can cause pain and make you feel ill. An untreated abscess can cause infection to spread to other parts of your body or your bloodstream. The abscess may need to be drained. You may also need to take antibiotics. What are the causes? An abscess occurs when germs, like bacteria, pass through your skin and cause an infection. This may be caused by: A scrape or cut on your skin. A puncture wound through your skin, such as a needle injection or insect bite. Blocked oil or sweat glands. Blocked and infected hair follicles. A fluid-filled sac that forms beneath your skin (sebaceous cyst) and becomes infected. What increases the risk? You may be more likely to develop an abscess if: You have problems with blood circulation, or you have a weak body defense system (immune system). You have diabetes. You have dry  and irritated skin. You get injections often or use IV drugs. You have a foreign body in a wound, such as a splinter. You smoke or use tobacco products. What are the signs or symptoms? Symptoms of this condition include: A painful, firm bump under the skin. A bump with pus at the top. This may break through the skin and drain. Other symptoms include: Redness and swelling around the abscess. Warmth or tenderness. Swelling of the lymph nodes (glands) near the abscess. A sore on the skin. How is this diagnosed? This condition may be diagnosed based on a physical exam and your medical history. You may also have tests done, such as: A test of a sample of pus. This may be done to find what is causing the infection. Blood tests. Imaging tests, such as an ultrasound, CT scan, or MRI. How is this treated? A small abscess that drains on its own may not need to be treated. Treatment for larger abscesses may include: Moist heat or a heat pack applied to the area a few times a day. Incision and drainage. This is a procedure to drain the abscess. Antibiotics. For a severe abscess, you may first get antibiotics through an IV and then change to antibiotics by mouth. Follow these instructions at home: Medicines Take over-the-counter and prescription medicines only as told by your provider. If you were prescribed antibiotics, take them as told by your provider. Do not stop using the antibiotic even if you start to feel better. Abscess care  If you have an abscess that has not drained, apply heat to the affected area. Use the heat source that your provider recommends, such as a moist heat pack or a heating pad. Place a towel between your skin and the heat source. Leave the heat on for 20-30 minutes at a time. If your skin turns bright red, remove the heat right away to prevent burns. The risk of burns is higher if you cannot feel pain, heat, or cold. Follow instructions from your provider about how to  take care of your abscess. Make sure you: Cover the abscess with a bandage (dressing). Wash your hands with soap and water for at least 20 seconds before and after you change the dressing or gauze. If soap and water are not available, use hand sanitizer. Change your dressing or gauze as told by your provider. Check your abscess every day for signs of an infection that is getting worse. Check for: More redness, swelling, pain, or tenderness. More fluid or blood. Warmth. More pus or a worse smell. General instructions To avoid spreading the infection: Do not share personal care items, towels, or hot tubs with others. Avoid making skin contact with other people. Be careful when getting rid of used dressings, wound packing, or any drainage from the abscess. Do not use any products that contain nicotine or tobacco. These products include cigarettes, chewing tobacco, and vaping devices, such as e-cigarettes. If you need help quitting, ask your provider. Do not use any creams, ointments, or liquids unless you have been told to by your provider. Contact a health care provider if: You see redness that spreads quickly or red streaks on your skin spreading away from the abscess. You have any signs of worse infection at the abscess. You vomit every time you eat or drink. You have a fever, chills, or muscle aches. The cyst or abscess returns. Get help right away if: You have severe pain. You make less pee (urine) than normal. This information is not intended to replace advice given to you by your health care provider. Make sure you discuss any questions you have with your health care provider. Document Revised: 01/03/2022 Document Reviewed: 01/03/2022 Elsevier Patient Education  2024 [employer].

## 2022-12-27 ENCOUNTER — Emergency Department (HOSPITAL_COMMUNITY)
Admission: EM | Admit: 2022-12-27 | Discharge: 2022-12-27 | Disposition: A | Discharge status: Home / Self Care | Payer: Medicaid Other | Attending: Emergency Medicine | Admitting: Emergency Medicine

## 2022-12-27 ENCOUNTER — Emergency Department (HOSPITAL_COMMUNITY): Payer: Medicaid Other

## 2022-12-27 ENCOUNTER — Encounter (HOSPITAL_COMMUNITY): Payer: Self-pay

## 2022-12-27 ENCOUNTER — Other Ambulatory Visit: Payer: Self-pay

## 2022-12-27 DIAGNOSIS — R112 Nausea with vomiting, unspecified: Secondary | ICD-10-CM | POA: Insufficient documentation

## 2022-12-27 DIAGNOSIS — R1010 Upper abdominal pain, unspecified: Secondary | ICD-10-CM | POA: Diagnosis present

## 2022-12-27 DIAGNOSIS — E876 Hypokalemia: Secondary | ICD-10-CM | POA: Insufficient documentation

## 2022-12-27 LAB — CBC WITH DIFFERENTIAL/PLATELET
Abs Immature Granulocytes: 0.04 10*3/uL (ref 0.00–0.07)
Basophils Absolute: 0 10*3/uL (ref 0.0–0.1)
Basophils Relative: 0 %
Eosinophils Absolute: 0 10*3/uL (ref 0.0–0.5)
Eosinophils Relative: 1 %
HCT: 40.8 % (ref 36.0–46.0)
Hemoglobin: 13.4 g/dL (ref 12.0–15.0)
Immature Granulocytes: 1 %
Lymphocytes Relative: 30 %
Lymphs Abs: 1.9 10*3/uL (ref 0.7–4.0)
MCH: 27.7 pg (ref 26.0–34.0)
MCHC: 32.8 g/dL (ref 30.0–36.0)
MCV: 84.5 fL (ref 80.0–100.0)
Monocytes Absolute: 0.4 10*3/uL (ref 0.1–1.0)
Monocytes Relative: 6 %
Neutro Abs: 3.8 10*3/uL (ref 1.7–7.7)
Neutrophils Relative %: 62 %
Platelets: 232 10*3/uL (ref 150–400)
RBC: 4.83 MIL/uL (ref 3.87–5.11)
RDW: 14.1 % (ref 11.5–15.5)
WBC: 6.1 10*3/uL (ref 4.0–10.5)
nRBC: 0 % (ref 0.0–0.2)

## 2022-12-27 LAB — COMPREHENSIVE METABOLIC PANEL
ALT: 12 U/L (ref 0–44)
AST: 18 U/L (ref 15–41)
Albumin: 3.4 g/dL — ABNORMAL LOW (ref 3.5–5.0)
Alkaline Phosphatase: 49 U/L (ref 38–126)
Anion gap: 11 (ref 5–15)
BUN: 15 mg/dL (ref 8–23)
CO2: 27 mmol/L (ref 22–32)
Calcium: 9.3 mg/dL (ref 8.9–10.3)
Chloride: 101 mmol/L (ref 98–111)
Creatinine, Ser: 0.77 mg/dL (ref 0.44–1.00)
GFR, Estimated: >60 mL/min (ref >60)
Glucose, Bld: 99 mg/dL (ref 70–99)
Potassium: 2.9 mmol/L — ABNORMAL LOW (ref 3.5–5.1)
Sodium: 139 mmol/L (ref 135–145)
Total Bilirubin: 0.6 mg/dL (ref 0.3–1.2)
Total Protein: 7.4 g/dL (ref 6.5–8.1)

## 2022-12-27 LAB — LIPASE, BLOOD: Lipase: 29 U/L (ref 11–51)

## 2022-12-27 MED ORDER — LACTATED RINGERS IV BOLUS
1000.0000 mL | Freq: Once | INTRAVENOUS | Status: AC
  Administered 2022-12-27: 1000 mL via INTRAVENOUS

## 2022-12-27 MED ORDER — METOCLOPRAMIDE HCL 5 MG/ML IJ SOLN
10.0000 mg | Freq: Once | INTRAMUSCULAR | Status: AC
  Administered 2022-12-27: 10 mg via INTRAVENOUS
  Filled 2022-12-27: qty 2

## 2022-12-27 MED ORDER — PANTOPRAZOLE SODIUM 20 MG PO TBEC: 20.0000 mg | DELAYED_RELEASE_TABLET | Freq: Every day | ORAL | 0 refills | Status: DC

## 2022-12-27 MED ORDER — SUCRALFATE 1 G PO TABS: 1.0000 g | ORAL_TABLET | Freq: Four times a day (QID) | ORAL | 0 refills | Status: DC

## 2022-12-27 MED ORDER — LACTATED RINGERS IV SOLN: INTRAVENOUS | Status: DC

## 2022-12-27 MED ORDER — DIPHENHYDRAMINE HCL 50 MG/ML IJ SOLN
12.5000 mg | Freq: Once | INTRAMUSCULAR | Status: AC
  Administered 2022-12-27: 12.5 mg via INTRAVENOUS
  Filled 2022-12-27: qty 1

## 2022-12-27 MED ORDER — PANTOPRAZOLE SODIUM 40 MG IV SOLR
40.0000 mg | Freq: Once | INTRAVENOUS | Status: AC
  Administered 2022-12-27: 40 mg via INTRAVENOUS
  Filled 2022-12-27: qty 10

## 2022-12-27 MED ORDER — POTASSIUM CHLORIDE CRYS ER 20 MEQ PO TBCR
40.0000 meq | EXTENDED_RELEASE_TABLET | Freq: Once | ORAL | Status: AC
  Administered 2022-12-27: 40 meq via ORAL
  Filled 2022-12-27: qty 2

## 2022-12-27 NOTE — ED Provider Notes (Signed)
EMERGENCY DEPARTMENT AT [facility] Provider Note   CSN: [id number] Arrival date & time: 12/27/22  0865     History  Chief Complaint  Patient presents with   Abdominal Pain    Kristain Filo is a 62 y.o. female.  62 year old female who presents with nausea vomiting abdominal pain.  Symptoms began yesterday.  She stands for the past.  Will schedule her colonoscopy done but could not complete the prep.  Denies any prior abdominal surgeries.  She has had emesis x 2.  Denies any urinary symptoms.  Patient did recently start taking antibiotics for a right axilla abscess.  Outside records reviewed and confirms this.  She is on doxycycline and that could be contributing to this.       Home Medications Prior to Admission medications   Medication Sig Start Date End Date Taking? Authorizing Provider  Acetaminophen (TYLENOL ARTHRITIS PAIN PO) Take 1 tablet by mouth daily.    [provider]  albuterol (VENTOLIN HFA) 108 (90 Base) MCG/ACT inhaler Inhale 1-2 puffs into the lungs every 6 (six) hours as needed for wheezing or shortness of breath. Patient not taking: Reported on 10/24/2022 01/10/22   Gustavus Bryant, FNP  chlorthalidone (HYGROTON) 25 MG tablet Take 1 tablet (25 mg total) by mouth daily. 09/18/22   Storm Frisk, MD  clobetasol cream (TEMOVATE) 0.05 % Apply 1 Application topically 2 (two) times daily. To affected area on left forearm Patient not taking: Reported on 12/25/2022 09/18/22   Storm Frisk, MD  diazepam (VALIUM) 5 MG tablet Take one tablet by mouth with food one hour prior to procedure. May repeat 30 minutes prior if needed. Patient not taking: Reported on 12/25/2022 11/06/22   Juanda Chance, NP  doxycycline (VIBRAMYCIN) 100 MG capsule Take 1 capsule (100 mg total) by mouth 2 (two) times daily. 12/25/22   Mayers, Cari S, PA-C  traMADol (ULTRAM) 50 MG tablet Take 1 tablet (50 mg total) by mouth every 6 (six) hours as needed for moderate  pain. 11/06/22   Nadara Mustard, MD      Allergies    Patient has no known allergies.    Review of Systems   Review of Systems  All other systems reviewed and are negative.   Physical Exam Updated Vital Signs BP 138/67 (BP Location: Left Arm)   Pulse 64   Temp 98.1 F (36.7 C) (Oral)   Resp 16   Ht 1.499 m (4\' 11" )   Wt 102.1 kg   SpO2 99%   BMI 45.44 kg/m  Physical Exam Vitals and nursing note reviewed.  Constitutional:      General: She is not in acute distress.    Appearance: Normal appearance. She is well-developed. She is not toxic-appearing.  HENT:     Head: Normocephalic and atraumatic.  Eyes:     General: Lids are normal.     Conjunctiva/sclera: Conjunctivae normal.     Pupils: Pupils are equal, round, and reactive to light.  Neck:     Thyroid: No thyroid mass.     Trachea: No tracheal deviation.  Cardiovascular:     Rate and Rhythm: Normal rate and regular rhythm.     Heart sounds: Normal heart sounds. No murmur heard.    No gallop.  Pulmonary:     Effort: Pulmonary effort is normal. No respiratory distress.     Breath sounds: Normal breath sounds. No stridor. No decreased breath sounds, wheezing, rhonchi or rales.  Abdominal:  General: There is no distension.     Palpations: Abdomen is soft.     Tenderness: There is abdominal tenderness in the epigastric area. There is no guarding or rebound.  Musculoskeletal:        General: No tenderness. Normal range of motion.     Cervical back: Normal range of motion and neck supple.  Skin:    General: Skin is warm and dry.     Findings: No abrasion or rash.  Neurological:     Mental Status: She is alert and oriented to person, place, and time. Mental status is at baseline.     GCS: GCS eye subscore is 4. GCS verbal subscore is 5. GCS motor subscore is 6.     Cranial Nerves: Cranial nerves are intact. No cranial nerve deficit.     Sensory: No sensory deficit.     Motor: Motor function is intact.   Psychiatric:        Attention and Perception: Attention normal.        Speech: Speech normal.        Behavior: Behavior normal.     ED Results / Procedures / Treatments   Labs (all labs ordered are listed, but only abnormal results are displayed) Labs Reviewed  CBC WITH DIFFERENTIAL/PLATELET  COMPREHENSIVE METABOLIC PANEL  LIPASE, BLOOD    EKG None  Radiology No results found.  Procedures Procedures    Medications Ordered in ED Medications  lactated ringers infusion (has no administration in time range)  lactated ringers bolus 1,000 mL (has no administration in time range)  metoCLOPramide (REGLAN) injection 10 mg (has no administration in time range)  diphenhydrAMINE (BENADRYL) injection 12.5 mg (has no administration in time range)    ED Course/ Medical Decision Making/ A&P                             Medical Decision Making Amount and/or Complexity of Data Reviewed Labs: ordered. Radiology: ordered.  Risk Prescription drug management.  Patient given IV fluids antiemetics and feels better. Patient's labs significant for mild hypokalemia potassium 2.9.  Given oral potassium here.  Acute abdominal series shows constipation.  No indication for abdominal CT at this time.  Abdominal exam remains benign.  Suspect some element of gastritis from the doxycycline.  Will place on PPI and Carafate and discharge.        Final Clinical Impression(s) / ED Diagnoses Final diagnoses:  None    Rx / DC Orders ED Discharge Orders     None         Lorre Nick, MD 12/27/22 1321

## 2022-12-27 NOTE — ED Triage Notes (Signed)
Patient presented to ER with nausea/vomiting/abdominal pain. Patient states she was scheduled for a colonoscopy about a month ago but the prep made her too sick, since last night she has had the same feeling of sickness with vomiting. Unable to keep down any medications, of note she has a boil in armpit that she was just started on antibiotics for 2 days ago.

## 2022-12-31 ENCOUNTER — Ambulatory Visit: Payer: Self-pay

## 2022-12-31 NOTE — Telephone Encounter (Signed)
      Chief Complaint: Elevated BP, 163/98  156/98. Asking to be worked in this week. Symptoms: Has a black spot right eye Frequency: Last week Pertinent Negatives:  Disposition: [] ED /[] Urgent Care (no appt availability in office) / [] Appointment(In office/virtual)/ []  [facility] [facility]/ [] Home Care/ [] Refused Recommended Disposition /[] [facility]/ [x]  Follow-up with PCP Additional Notes: Please advise pt.  Reason for Disposition  Systolic BP  >= 160 OR Diastolic >= 100  Answer Assessment - Initial Assessment Questions 1. BLOOD PRESSURE: "What is the blood pressure?" "Did you take at least two measurements 5 minutes apart?"     163/98   156/98 2. ONSET: "When did you take your blood pressure?"     Yesterday 3. HOW: "How did you take your blood pressure?" (e.g., automatic home BP monitor, visiting nurse)     Home cuff 4. HISTORY: "Do you have a history of high blood pressure?"     Yes 5. MEDICINES: "Are you taking any medicines for blood pressure?" "Have you missed any doses recently?"     Yes 6. OTHER SYMPTOMS: "Do you have any symptoms?" (e.g., blurred vision, chest pain, difficulty breathing, headache, weakness)     Black spot in right eye 7. PREGNANCY: "Is there any chance you are pregnant?" "When was your last menstrual period?"     No  Protocols used: Blood Pressure - High-A-AH

## 2022-12-31 NOTE — Telephone Encounter (Signed)
Spoke with patient . Verified name & DOB  Patient voiced that she is taking Bp med's as ordered and is still having evaluate BP.Marland Kitchen Having c/c of headaches. Requesting to see PCP as soon as possible. Earliest appointment with PCP is 01/08/23. Advised that she should go to UC or our [facility]. Patient voiced that she just went to [facility] last week and would prefer seeing her PCP. Advised the we have no Immediate appointment available. The only other alterative is that we place her on our WALK-IN schedule for tomorrow. Advised patient that there are not guarantees that she will be seen however the likelihood is high she will be seen ; if a patient does not show for their appointment.  patient put on the walk in schedule and also be placed on the schedule to 01/08/2023 in case she not seen.

## 2023-01-01 ENCOUNTER — Encounter: Payer: Self-pay | Admitting: Family Medicine

## 2023-01-01 ENCOUNTER — Ambulatory Visit: Payer: Medicaid Other | Attending: Family Medicine | Admitting: Family Medicine

## 2023-01-01 ENCOUNTER — Ambulatory Visit: Payer: Medicaid Other | Admitting: Critical Care Medicine

## 2023-01-01 VITALS — BP 137/84 | HR 57 | Wt 225.4 lb

## 2023-01-01 DIAGNOSIS — R1013 Epigastric pain: Secondary | ICD-10-CM

## 2023-01-01 DIAGNOSIS — K5909 Other constipation: Secondary | ICD-10-CM | POA: Diagnosis not present

## 2023-01-01 DIAGNOSIS — E876 Hypokalemia: Secondary | ICD-10-CM | POA: Diagnosis not present

## 2023-01-01 DIAGNOSIS — L0292 Furuncle, unspecified: Secondary | ICD-10-CM

## 2023-01-01 NOTE — Progress Notes (Signed)
Subjective:  Patient ID: Sally Reid, female    DOB: 08-19-1960  Age: 62 y.o. MRN: [id number]  CC: Hypertension (Elevated BP/Spots on eye/Abdominal pain)   HPI Sally Reid is a 62 y.o. year old female with a history of hypertension here for an office visit.  Interval History: Discussed the use of AI scribe software for clinical note transcription with the patient, who gave verbal consent to proceed.   The patient, with a history of a recent boil, presents with ongoing abdominal pain. She was recently treated in the emergency room for similar symptoms, which were thought to be related to a course of doxycycline prescribed for the boil. The patient reports vomiting after taking the antibiotic and subsequently discontinued it. She has not been treating the boil, which has stopped draining but remains present.  The patient has not been taking the Protonix and Carafate prescribed in the emergency room due to fear of further stomach upset. She reports a history of stomach sensitivity, including a recent adverse reaction to colonoscopy prep medication. The patient has been experiencing decreased appetite and ongoing abdominal pain, which she describes as generalized and located in the upper abdomen.  The patient also reports constipation, which she has been managing with Dulcolax, though she notes that her bowel movements have not returned to normal. She has not been taking any other medications.   Abdominal x-ray performed in the ED 5 days ago revealed: IMPRESSION: 1. Non obstructed bowel gas pattern withmoderate volume of retained stool. No pneumoperitoneum. 2. No cardiopulmonary abnormality.       Past Medical History:  Diagnosis Date   Arthritis    back/R shoulder   Hypertension    on meds    Past Surgical History:  Procedure Laterality Date   ABDOMINAL HYSTERECTOMY  2005   ANKLE FRACTURE SURGERY Left 2011   CESAREAN SECTION     2 previous   KNEE ARTHROSCOPY Left 2008    for cartilage repair   LEG SURGERY  2011   TUBAL LIGATION      Family History  Problem Relation Age of Onset   Stomach cancer Mother 47   Diabetes Mother    Hypertension Mother    Hodgkin's lymphoma Brother 29   Colon polyps Neg Hx    Colon cancer Neg Hx    Esophageal cancer Neg Hx    Rectal cancer Neg Hx     Social History   Socioeconomic History   Marital status: Married    Spouse name: Not on file   Number of children: Not on file   Years of education: Not on file   Highest education level: Not on file  Occupational History   Occupation: unemployed    Comment: without health insurance  Tobacco Use   Smoking status: Some Days    Current packs/day: 0.10    Types: Cigarettes   Smokeless tobacco: Never   Tobacco comments:    Smokes 2 cigarettes/day - cutting back  Vaping Use   Vaping status: Never Used  Substance and Sexual Activity   Alcohol use: Yes    Alcohol/week: 3.0 standard drinks of alcohol    Types: 3 Standard drinks or equivalent per week    Comment: occassionally   Drug use: No   Sexual activity: Yes    Birth control/protection: Surgical  Other Topics Concern   Not on file  Social History Narrative   Lives in [place].   Had 2 children( two boys 7 and 29).   Living with  fiancee.   Social Determinants of Health   Financial Resource Strain: Not on file  Food Insecurity: Not on file  Transportation Needs: Not on file  Physical Activity: Not on file  Stress: Not on file  Social Connections: Not on file    No Known Allergies  Outpatient Medications Prior to Visit  Medication Sig Dispense Refill   chlorthalidone (HYGROTON) 25 MG tablet Take 1 tablet (25 mg total) by mouth daily. 90 tablet 4   Acetaminophen (TYLENOL ARTHRITIS PAIN PO) Take 1 tablet by mouth daily. (Patient not taking: Reported on 01/01/2023)     albuterol (VENTOLIN HFA) 108 (90 Base) MCG/ACT inhaler Inhale 1-2 puffs into the lungs every 6 (six) hours as needed for wheezing  or shortness of breath. (Patient not taking: Reported on 10/24/2022) 1 each 0   clobetasol cream (TEMOVATE) 0.05 % Apply 1 Application topically 2 (two) times daily. To affected area on left forearm (Patient not taking: Reported on 12/25/2022) 60 g 1   diazepam (VALIUM) 5 MG tablet Take one tablet by mouth with food one hour prior to procedure. May repeat 30 minutes prior if needed. (Patient not taking: Reported on 12/25/2022) 2 tablet 0   doxycycline (VIBRAMYCIN) 100 MG capsule Take 1 capsule (100 mg total) by mouth 2 (two) times daily. (Patient not taking: Reported on 01/01/2023) 20 capsule 0   pantoprazole (PROTONIX) 20 MG tablet Take 1 tablet (20 mg total) by mouth daily. (Patient not taking: Reported on 01/01/2023) 30 tablet 0   sucralfate (CARAFATE) 1 g tablet Take 1 tablet (1 g total) by mouth 4 (four) times daily. (Patient not taking: Reported on 01/01/2023) 30 tablet 0   traMADol (ULTRAM) 50 MG tablet Take 1 tablet (50 mg total) by mouth every 6 (six) hours as needed for moderate pain. (Patient not taking: Reported on 01/01/2023) 30 tablet 0   No facility-administered medications prior to visit.     ROS Review of Systems  Constitutional:  Negative for activity change and appetite change.  HENT:  Negative for sinus pressure and sore throat.   Respiratory:  Negative for chest tightness, shortness of breath and wheezing.   Cardiovascular:  Negative for chest pain and palpitations.  Gastrointestinal:  Positive for abdominal pain and constipation. Negative for abdominal distention.  Genitourinary: Negative.   Musculoskeletal: Negative.   Psychiatric/Behavioral:  Negative for behavioral problems and dysphoric mood.     Objective:  BP 137/84   Pulse (!) 57   Wt 225 lb 6.4 oz (102.2 kg)   SpO2 98%   BMI 45.53 kg/m      01/01/2023    8:52 AM 12/27/2022    1:33 PM 12/27/2022   10:20 AM  BP/Weight  Systolic BP 137 146   Diastolic BP 84 76   Wt. (Lbs) 225.4  225  BMI 45.53 kg/m2  45.44  kg/m2      Physical Exam Constitutional:      Appearance: She is well-developed.  Cardiovascular:     Rate and Rhythm: Bradycardia present.     Heart sounds: Normal heart sounds. No murmur heard. Pulmonary:     Effort: Pulmonary effort is normal.     Breath sounds: Normal breath sounds. No wheezing or rales.  Chest:     Chest wall: No tenderness.  Abdominal:     General: Bowel sounds are normal. There is no distension.     Palpations: Abdomen is soft. There is no mass.     Tenderness: There is abdominal tenderness (epigastric).  Musculoskeletal:        General: Normal range of motion.     Right lower leg: No edema.     Left lower leg: No edema.  Neurological:     Mental Status: She is alert and oriented to person, place, and time.  Psychiatric:        Mood and Affect: Mood normal.        Latest Ref Rng & Units 12/27/2022   11:04 AM 06/13/2022    9:24 AM 05/03/2022    3:47 PM  CMP  Glucose 70 - 99 mg/dL 99  90  88   BUN 8 - 23 mg/dL 15  12  9    Creatinine 0.44 - 1.00 mg/dL 2.95  6.21  3.08   Sodium 135 - 145 mmol/L 139  141  140   Potassium 3.5 - 5.1 mmol/L 2.9  3.5  4.1   Chloride 98 - 111 mmol/L 101  103  101   CO2 22 - 32 mmol/L 27  30  26    Calcium 8.9 - 10.3 mg/dL 9.3  9.7  9.9   Total Protein 6.5 - 8.1 g/dL 7.4  6.9    Total Bilirubin 0.3 - 1.2 mg/dL 0.6  0.4    Alkaline Phos 38 - 126 U/L 49     AST 15 - 41 U/L 18  13    ALT 0 - 44 U/L 12  8      Lipid Panel     Component Value Date/Time   CHOL  04/10/2009 0205    152        ATP III CLASSIFICATION:  <200     mg/dL   Desirable  657-846  mg/dL   Borderline High  >=962    mg/dL   High          TRIG 90 04/10/2009 0205   HDL 53 04/10/2009 0205   CHOLHDL 2.9 04/10/2009 0205   VLDL 18 04/10/2009 0205   LDLCALC  04/10/2009 0205    81        Total Cholesterol/HDL:CHD Risk Coronary Heart Disease Risk Table                     Men   Women  1/2 Average Risk   3.4   3.3  Average Risk       5.0   4.4  2 X  Average Risk   9.6   7.1  3 X Average Risk  23.4   11.0        Use the calculated Patient Ratio above and the CHD Risk Table to determine the patient's CHD Risk.        ATP III CLASSIFICATION (LDL):  <100     mg/dL   Optimal  952-841  mg/dL   Near or Above                    Optimal  130-159  mg/dL   Borderline  324-401  mg/dL   High  >027     mg/dL   Very High    CBC    Component Value Date/Time   WBC 6.1 12/27/2022 1104   RBC 4.83 12/27/2022 1104   HGB 13.4 12/27/2022 1104   HCT 40.8 12/27/2022 1104   PLT 232 12/27/2022 1104   MCV 84.5 12/27/2022 1104   MCH 27.7 12/27/2022 1104   MCHC 32.8 12/27/2022 1104   RDW 14.1 12/27/2022 1104   LYMPHSABS 1.9 12/27/2022 1104  MONOABS 0.4 12/27/2022 1104   EOSABS 0.0 12/27/2022 1104   BASOSABS 0.0 12/27/2022 1104    Lab Results  Component Value Date   HGBA1C  04/11/2009    5.2 (NOTE) The ADA recommends the following therapeutic goal for glycemic control related to Hgb A1c measurement: Goal of therapy: <6.5 Hgb A1c  Reference: American Diabetes Association: Clinical Practice Recommendations 2010, Diabetes Care, 2010, 33: (Suppl  1).    Assessment & Plan:      Abdominal Pain: Likely gastritis secondary to recent antibiotic use (doxycycline) for a boil. Patient has stopped the antibiotic due to vomiting. Patient has not been taking prescribed Protonix and Carafate due to fear of further stomach upset. -Encouraged patient to start Protonix and Carafate to soothe the stomach and treat gastritis.  Hypokalemia: Potassium in the ED was 2.95 days ago likely due to vomiting -Will recheck levels today.  Furuncle: Present for 2-3 weeks, no longer draining. Patient stopped doxycycline due to vomiting. -Continue warm compresses. No need for antibiotics at this time.  Constipation: Noted on recent x-ray. Patient has been using Dulcolax with some relief. -Continue Dulcolax daily to promote bowel movements.  Colonoscopy Preparation:  Patient had adverse reaction to colonoscopy prep medication last month. -Advised patient to discuss this with GI specialist.          No orders of the defined types were placed in this encounter.   Follow-up: Return in about 1 month (around 02/01/2023) for Medical conditions with PCP to discuss additional concerns.       Hoy Register, MD, FAAFP. [facility] [place], [place] [phone]   01/01/2023, 9:13 AM

## 2023-01-01 NOTE — Patient Instructions (Signed)

## 2023-01-02 ENCOUNTER — Other Ambulatory Visit: Payer: Self-pay | Admitting: Family Medicine

## 2023-01-02 MED ORDER — POTASSIUM CHLORIDE CRYS ER 20 MEQ PO TBCR: 20.0000 meq | EXTENDED_RELEASE_TABLET | Freq: Every day | ORAL | 3 refills | Status: DC

## 2023-01-08 ENCOUNTER — Ambulatory Visit: Payer: Medicaid Other | Admitting: Critical Care Medicine

## 2023-02-06 ENCOUNTER — Encounter: Payer: Self-pay | Admitting: Gastroenterology

## 2023-03-06 ENCOUNTER — Other Ambulatory Visit: Payer: Self-pay | Admitting: Orthopedic Surgery

## 2023-03-15 ENCOUNTER — Ambulatory Visit: Payer: Medicaid Other | Admitting: *Deleted

## 2023-03-15 VITALS — Ht 59.0 in | Wt 220.0 lb

## 2023-03-15 DIAGNOSIS — Z1211 Encounter for screening for malignant neoplasm of colon: Secondary | ICD-10-CM

## 2023-03-15 NOTE — Progress Notes (Signed)
Pre visit completed over telephone. Instructions mailed and sent through Mychart  Patient switched to 7 days of daily Miralax plus split dose of Miralax for bowel prep due to nausea associated with prep in the past.    No egg or soy allergy known to patient  No issues known to pt with past sedation with any surgeries or procedures Patient denies ever being told they had issues or difficulty with intubation  No FH of Malignant Hyperthermia Pt is not on diet pills Pt is not on  home 02  Pt is not on blood thinners  Pt denies issues with constipation  No A fib or A flutter Have any cardiac testing pending--no Pt instructed to use [email or web address] or GoodRx for a price reduction on prep

## 2023-03-19 ENCOUNTER — Ambulatory Visit: Payer: Medicaid Other | Admitting: Orthopedic Surgery

## 2023-03-19 ENCOUNTER — Telehealth: Payer: Self-pay | Admitting: Physical Medicine and Rehabilitation

## 2023-03-19 DIAGNOSIS — M5416 Radiculopathy, lumbar region: Secondary | ICD-10-CM

## 2023-03-19 DIAGNOSIS — M25511 Pain in right shoulder: Secondary | ICD-10-CM | POA: Diagnosis not present

## 2023-03-19 DIAGNOSIS — G8929 Other chronic pain: Secondary | ICD-10-CM

## 2023-03-19 NOTE — Telephone Encounter (Signed)
Patient was here. Would like an appointment with Dr. Alvester Morin

## 2023-03-20 ENCOUNTER — Encounter: Payer: Self-pay | Admitting: Orthopedic Surgery

## 2023-03-20 DIAGNOSIS — M25511 Pain in right shoulder: Secondary | ICD-10-CM | POA: Diagnosis not present

## 2023-03-20 DIAGNOSIS — G8929 Other chronic pain: Secondary | ICD-10-CM

## 2023-03-20 MED ORDER — LIDOCAINE HCL 1 % IJ SOLN
5.0000 mL | INTRAMUSCULAR | Status: AC | PRN
Start: 2023-03-20 — End: 2023-03-20
  Administered 2023-03-20: 5 mL

## 2023-03-20 MED ORDER — METHYLPREDNISOLONE ACETATE 40 MG/ML IJ SUSP
40.0000 mg | INTRAMUSCULAR | Status: AC | PRN
Start: 2023-03-20 — End: 2023-03-20
  Administered 2023-03-20: 40 mg via INTRA_ARTICULAR

## 2023-03-20 NOTE — Addendum Note (Signed)
Addended by: Ashok Norris on: 03/20/2023 05:00 PM   Modules accepted: Orders

## 2023-03-20 NOTE — Progress Notes (Signed)
Office Visit Note   [name]           Date of Birth: 1960-07-25           MRN: [id number] Visit Date: 03/19/2023              Requested by: Storm Frisk, MD [address] [place],  [place] [address] PCP: Storm Frisk, MD  Chief Complaint  Patient presents with   Right Shoulder - Pain      HPI: Patient is a 62 year old woman who presents in follow-up for impingement symptoms right shoulder.  Patient has had no relief with Voltaren gel and activity modification.  Assessment & Plan: Visit Diagnoses:  1. Chronic right shoulder pain     Plan: Right shoulder was injected in the subacromial space patient did have some immediate interval relief.  Will reevaluate as needed.  Discussed that if she is still symptomatic in a month for her to call and we will set her up for an MRI scan.  Follow-Up Instructions: No follow-ups on file.   Ortho Exam  Patient is alert, oriented, no adenopathy, well-dressed, normal affect, normal respiratory effort. Examination patient has abduction and flexion to 90 degrees.  She has pain with Neer and Hawkins impingement test she has pain to palpation over the biceps tendon.  Imaging: No results found. No images are attached to the encounter.  Labs: Lab Results  Component Value Date   HGBA1C  04/11/2009    5.2 (NOTE) The ADA recommends the following therapeutic goal for glycemic control related to Hgb A1c measurement: Goal of therapy: <6.5 Hgb A1c  Reference: American Diabetes Association: Clinical Practice Recommendations 2010, Diabetes Care, 2010, 33: (Suppl  1).   REPTSTATUS 03/15/2020 FINAL 03/14/2020   CULT (A) 03/14/2020    <10,000 COLONIES/mL INSIGNIFICANT GROWTH Performed at [facility], [address] [place], [place] [address]      Lab Results  Component Value Date   ALBUMIN 3.4 (L) 12/27/2022   ALBUMIN 3.5 04/07/2021   ALBUMIN 3.5 03/14/2020    Lab Results  Component Value Date    MG 2.0 04/30/2022   MG 1.9 04/11/2009   No results found for: "VD25OH"  No results found for: "PREALBUMIN"    Latest Ref Rng & Units 12/27/2022   11:04 AM 06/13/2022    9:24 AM 04/30/2022    5:29 AM  CBC EXTENDED  WBC 4.0 - 10.5 K/uL 6.1  5.4  9.8   RBC 3.87 - 5.11 MIL/uL 4.83  4.62  4.69   Hemoglobin 12.0 - 15.0 g/dL 82.9  56.2  13.0   HCT 36.0 - 46.0 % 40.8  39.7  40.8   Platelets 150 - 400 K/uL 232  253  235   NEUT# 1.7 - 7.7 K/uL 3.8     Lymph# 0.7 - 4.0 K/uL 1.9        There is no height or weight on file to calculate BMI.  Orders:  No orders of the defined types were placed in this encounter.  No orders of the defined types were placed in this encounter.    Procedures: Large Joint Inj: R subacromial bursa on 03/20/2023 1:33 PM Indications: diagnostic evaluation and pain Details: 22 G 1.5 in needle, posterior approach  Arthrogram: No  Medications: 5 mL lidocaine 1 %; 40 mg methylPREDNISolone acetate 40 MG/ML Outcome: tolerated well, no immediate complications Procedure, treatment alternatives, risks and benefits explained, specific risks discussed. Consent was given  by the patient. Immediately prior to procedure a time out was called to verify the correct patient, procedure, equipment, support staff and site/side marked as required. Patient was prepped and draped in the usual sterile fashion.      Clinical Data: No additional findings.  ROS:  All other systems negative, except as noted in the HPI. Review of Systems  Objective: Vital Signs: There were no vitals taken for this visit.  Specialty Comments:  EXAM: MRI LUMBAR SPINE WITHOUT CONTRAST   TECHNIQUE: Multiplanar, multisequence MR imaging of the lumbar spine was performed. No intravenous contrast was administered.   COMPARISON:  Radiographs of the lumbar spine 08/21/2021 (images available, report unavailable).   FINDINGS: Segmentation: 5 lumbar vertebrae. The caudal most well-formed into the  vol disc space is designated L5-S1.   Alignment: Slight grade 1 retrolisthesis at L2-L3 and L3-L4. Slight grade 1 anterolisthesis at L4-L5 and L5-S1.   Vertebrae: Vertebral body height is maintained. Mild marrow edema within the posterior elements bilaterally at L4-L5 and L5-S1, likely degenerative and related to facet arthrosis. Minimal degenerative endplate edema at Z61-W96, T11-T12, T12-L1 and L5-S1.   Conus medullaris and cauda equina: Conus extends to the L1-L2 level. No signal abnormality identified within the visualized distal spinal cord.   Paraspinal and other soft tissues: No abnormality identified within included portions of the abdomen/retroperitoneum. No paraspinal mass or collection.   Disc levels:   No more than mild disc degeneration.   T10-T11: This level is imaged in the sagittal plane only. Slight disc bulge. No significant spinal canal or foraminal stenosis.   T11-T12: This level is imaged in the sagittal plane only. Small central disc protrusion. Mild effacement of ventral thecal sac (without spinal cord mass effect). No significant foraminal stenosis.   T12-L1: 10 mm central/right subarticular disc extrusion. The disc extrusion focally effaces the ventral thecal sac and likely contacts the ventral aspect of the spinal cord. The dorsal CSF space is maintained within the spinal canal. No significant foraminal stenosis.   L1-L2: Central posterior annular fissure. Small disc bulge. No significant spinal canal or foraminal stenosis.   L2-L3: Slight grade 1 retrolisthesis. Small disc bulge. No significant spinal canal or foraminal stenosis.   L3-L4: Slight grade 1 retrolisthesis. Small disc bulge. No significant spinal canal or foraminal stenosis.   L4-L5: Slight grade 1 anterolisthesis. Slight disc uncovering with disc bulge. Mild to moderate facet arthrosis with ligamentum flavum hypertrophy. No significant spinal canal stenosis. Mild  relative bilateral neural foraminal narrowing.   L5-S1: Slight grade 1 anterolisthesis. Slight disc uncovering with disc bulge. Moderate facet arthrosis with ligamentum flavum hypertrophy. No significant spinal canal stenosis. Mild relative bilateral neural foraminal narrowing.   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   At T12-L1, there is a 10 mm central/right subarticular disc extrusion which focally effaces the ventral thecal sac and likely contacts the ventral aspect of the spinal cord.   At L4-L5, there is slight grade 1 anterolisthesis. Slight disc uncovering with disc bulge. Mild-to-moderate facet arthrosis with ligamentum flavum hypertrophy. No significant spinal canal stenosis. Mild relative bilateral neural foraminal narrowing.   At L5-S1, there is slight grade 1 anterolisthesis. Slight disc uncovering with disc bulge. Moderate facet arthrosis with ligamentum flavum hypertrophy. No significant spinal canal stenosis. Mild relative bilateral neural foraminal narrowing.   Mild marrow edema within the posterior elements bilaterally at L4-L5 and L5-S1, likely degenerative and related to facet arthrosis.  Electronically Signed   By: Jackey Loge D.O.   On: 01/10/2022 14:39  PMFS History: Patient Active Problem List   Diagnosis Date Noted   Chronic hepatitis C without hepatic coma (HCC) 04/21/2021   Left leg pain 04/18/2021   History of ankle fracture 04/18/2021   Tobacco use 04/18/2021   Essential hypertension 03/23/2020   Fatty liver 03/23/2020   Morbid obesity with BMI of 45.0-49.9, adult (HCC) 03/23/2020   Multiple thyroid nodules 07/14/2018   Past Medical History:  Diagnosis Date   Arthritis    back/R shoulder   Hypertension    on meds    Family History  Problem Relation Age of Onset   Stomach cancer Mother 89   Diabetes Mother    Hypertension Mother     Hodgkin's lymphoma Brother 54   Colon polyps Neg Hx    Colon cancer Neg Hx    Esophageal cancer Neg Hx    Rectal cancer Neg Hx     Past Surgical History:  Procedure Laterality Date   ABDOMINAL HYSTERECTOMY  2005   ANKLE FRACTURE SURGERY Left 2011   CESAREAN SECTION     2 previous   KNEE ARTHROSCOPY Left 2008   for cartilage repair   LEG SURGERY  2011   TUBAL LIGATION     Social History   Occupational History   Occupation: unemployed    Comment: without health insurance  Tobacco Use   Smoking status: Some Days    Current packs/day: 0.10    Types: Cigarettes   Smokeless tobacco: Never   Tobacco comments:    Smokes 2 cigarettes/day - cutting back  Vaping Use   Vaping status: Never Used  Substance and Sexual Activity   Alcohol use: Yes    Alcohol/week: 3.0 standard drinks of alcohol    Types: 3 Standard drinks or equivalent per week    Comment: occassionally   Drug use: No   Sexual activity: Yes    Birth control/protection: Surgical

## 2023-03-20 NOTE — Telephone Encounter (Signed)
Referral placed.

## 2023-03-20 NOTE — Telephone Encounter (Signed)
I called LMVM for patient to call me today at [facility] or to [facility] office tmrw.

## 2023-03-22 ENCOUNTER — Other Ambulatory Visit: Payer: Self-pay | Admitting: Physical Medicine and Rehabilitation

## 2023-03-22 ENCOUNTER — Telehealth: Payer: Self-pay

## 2023-03-22 MED ORDER — DIAZEPAM 5 MG PO TABS: ORAL_TABLET | ORAL | 0 refills | Status: DC

## 2023-03-22 NOTE — Telephone Encounter (Signed)
Patient scheduled for injection on 03/27/23. Needs pre procedure Valium sent to [facility]

## 2023-03-24 ENCOUNTER — Other Ambulatory Visit: Payer: Self-pay

## 2023-03-24 ENCOUNTER — Ambulatory Visit
Admission: EM | Admit: 2023-03-24 | Discharge: 2023-03-24 | Disposition: A | Discharge status: Home / Self Care | Payer: Medicaid Other | Attending: Internal Medicine | Admitting: Internal Medicine

## 2023-03-24 ENCOUNTER — Encounter: Payer: Self-pay | Admitting: *Deleted

## 2023-03-24 DIAGNOSIS — R059 Cough, unspecified: Secondary | ICD-10-CM | POA: Insufficient documentation

## 2023-03-24 DIAGNOSIS — B9789 Other viral agents as the cause of diseases classified elsewhere: Secondary | ICD-10-CM | POA: Diagnosis not present

## 2023-03-24 DIAGNOSIS — F17210 Nicotine dependence, cigarettes, uncomplicated: Secondary | ICD-10-CM | POA: Diagnosis not present

## 2023-03-24 DIAGNOSIS — Z1152 Encounter for screening for COVID-19: Secondary | ICD-10-CM | POA: Insufficient documentation

## 2023-03-24 DIAGNOSIS — J069 Acute upper respiratory infection, unspecified: Secondary | ICD-10-CM | POA: Diagnosis present

## 2023-03-24 MED ORDER — BENZONATATE 100 MG PO CAPS: 100.0000 mg | ORAL_CAPSULE | Freq: Three times a day (TID) | ORAL | 0 refills | Status: DC | PRN

## 2023-03-24 MED ORDER — FLUTICASONE PROPIONATE 50 MCG/ACT NA SUSP: 1.0000 | Freq: Every day | NASAL | 0 refills | Status: DC

## 2023-03-24 NOTE — Discharge Instructions (Signed)
Suspect that you have a viral illness as we discussed.  Two medications have been prescribed to help alleviate your symptoms.  COVID test is pending.

## 2023-03-24 NOTE — ED Provider Notes (Signed)
[name]    CSN: [id number] Arrival date & time: 03/24/23  1047      History   Chief Complaint Chief Complaint  Patient presents with   Cough    HPI Sally Reid is a 62 y.o. female.   Patient presents with 4-day history of cough, headache, body aches, nasal congestion.  Denies any fever or known sick contacts.  Patient has taken Mucinex for symptoms.  Denies history of asthma or COPD.  Patient reports that she does smoke cigarettes occasionally.   Cough   Past Medical History:  Diagnosis Date   Arthritis    back/R shoulder   Hypertension    on meds    Patient Active Problem List   Diagnosis Date Noted   Chronic hepatitis C without hepatic coma (HCC) 04/21/2021   Left leg pain 04/18/2021   History of ankle fracture 04/18/2021   Tobacco use 04/18/2021   Essential hypertension 03/23/2020   Fatty liver 03/23/2020   Morbid obesity with BMI of 45.0-49.9, adult (HCC) 03/23/2020   Multiple thyroid nodules 07/14/2018    Past Surgical History:  Procedure Laterality Date   ABDOMINAL HYSTERECTOMY  2005   ANKLE FRACTURE SURGERY Left 2011   CESAREAN SECTION     2 previous   KNEE ARTHROSCOPY Left 2008   for cartilage repair   LEG SURGERY  2011   TUBAL LIGATION      OB History     Gravida  3   Para  2   Term  2   Preterm      AB  1   Living  2      SAB      IAB  1   Ectopic      Multiple      Live Births               Home Medications    Prior to Admission medications   Medication Sig Start Date End Date Taking? Authorizing Provider  Acetaminophen (TYLENOL ARTHRITIS PAIN PO) Take 1 tablet by mouth daily.   Yes [provider]  benzonatate (TESSALON) 100 MG capsule Take 1 capsule (100 mg total) by mouth every 8 (eight) hours as needed for cough. 03/24/23  Yes Dwain Huhn, Rolly Salter E, FNP  chlorthalidone (HYGROTON) 25 MG tablet Take 1 tablet (25 mg total) by mouth daily. 09/18/22  Yes Storm Frisk, MD  fluticasone  (FLONASE) 50 MCG/ACT nasal spray Place 1 spray into both nostrils daily. 03/24/23  Yes Taniyah Ballow, Rolly Salter E, FNP  traMADol (ULTRAM) 50 MG tablet TAKE 1 TABLET BY MOUTH EVERY 6 HOURS AS NEEDED FOR MODERATE PAIN 03/06/23  Yes Adonis Huguenin, NP  albuterol (VENTOLIN HFA) 108 (90 Base) MCG/ACT inhaler Inhale 1-2 puffs into the lungs every 6 (six) hours as needed for wheezing or shortness of breath. Patient not taking: Reported on 10/24/2022 01/10/22   Gustavus Bryant, FNP  clobetasol cream (TEMOVATE) 0.05 % Apply 1 Application topically 2 (two) times daily. To affected area on left forearm Patient not taking: Reported on 12/25/2022 09/18/22   Storm Frisk, MD  diazepam (VALIUM) 5 MG tablet Take one tablet by mouth with food one hour prior to procedure. May repeat 30 minutes prior if needed. 03/22/23   Juanda Chance, NP  doxycycline (VIBRAMYCIN) 100 MG capsule Take 1 capsule (100 mg total) by mouth 2 (two) times daily. Patient not taking: Reported on 01/01/2023 12/25/22   Mayers, Cari S, PA-C  pantoprazole (PROTONIX) 20 MG  tablet Take 1 tablet (20 mg total) by mouth daily. Patient not taking: Reported on 01/01/2023 12/27/22   Lorre Nick, MD  potassium chloride SA (KLOR-CON M) 20 MEQ tablet Take 1 tablet (20 mEq total) by mouth daily. Patient not taking: Reported on 03/15/2023 01/02/23   Hoy Register, MD  sucralfate (CARAFATE) 1 g tablet Take 1 tablet (1 g total) by mouth 4 (four) times daily. Patient not taking: Reported on 01/01/2023 12/27/22   Lorre Nick, MD    Family History Family History  Problem Relation Age of Onset   Stomach cancer Mother 67   Diabetes Mother    Hypertension Mother    Hodgkin's lymphoma Brother 12   Colon polyps Neg Hx    Colon cancer Neg Hx    Esophageal cancer Neg Hx    Rectal cancer Neg Hx     Social History Social History   Tobacco Use   Smoking status: Some Days    Current packs/day: 0.10    Types: Cigarettes   Smokeless tobacco: Never   Tobacco  comments:    Smokes 2 cigarettes/day - cutting back  Vaping Use   Vaping status: Never Used  Substance Use Topics   Alcohol use: Yes    Alcohol/week: 3.0 standard drinks of alcohol    Types: 3 Standard drinks or equivalent per week    Comment: occassionally   Drug use: No     Allergies   Patient has no known allergies.   Review of Systems Review of Systems Per HPI  Physical Exam Triage Vital Signs ED Triage Vitals  Encounter Vitals Group     BP 03/24/23 1352 134/82     Systolic BP Percentile --      Diastolic BP Percentile --      Pulse Rate 03/24/23 1352 60     Resp 03/24/23 1352 20     Temp 03/24/23 1352 98 F (36.7 C)     Temp Source 03/24/23 1352 Oral     SpO2 03/24/23 1352 98 %     Weight --      Height --      Head Circumference --      Peak Flow --      Pain Score 03/24/23 1359 6     Pain Loc --      Pain Education --      Exclude from Growth Chart --    No data found.  Updated Vital Signs BP 134/82 (BP Location: Left Arm)   Pulse 60   Temp 98 F (36.7 C) (Oral)   Resp 20   SpO2 98%   Visual Acuity Right Eye Distance:   Left Eye Distance:   Bilateral Distance:    Right Eye Near:   Left Eye Near:    Bilateral Near:     Physical Exam Constitutional:      General: She is not in acute distress.    Appearance: Normal appearance. She is not toxic-appearing or diaphoretic.  HENT:     Head: Normocephalic and atraumatic.     Right Ear: Ear canal normal. A middle ear effusion is present. Tympanic membrane is not perforated, erythematous or bulging.     Left Ear: Ear canal normal. A middle ear effusion is present. Tympanic membrane is not perforated, erythematous or bulging.     Nose: Congestion present.     Mouth/Throat:     Mouth: Mucous membranes are moist.     Pharynx: Posterior oropharyngeal erythema present.  Eyes:  Extraocular Movements: Extraocular movements intact.     Conjunctiva/sclera: Conjunctivae normal.     Pupils: Pupils are  equal, round, and reactive to light.  Cardiovascular:     Rate and Rhythm: Normal rate and regular rhythm.     Pulses: Normal pulses.     Heart sounds: Normal heart sounds.  Pulmonary:     Effort: Pulmonary effort is normal. No respiratory distress.     Breath sounds: Normal breath sounds. No wheezing.  Abdominal:     General: Abdomen is flat. Bowel sounds are normal.     Palpations: Abdomen is soft.  Musculoskeletal:        General: Normal range of motion.     Cervical back: Normal range of motion.  Skin:    General: Skin is warm and dry.  Neurological:     General: No focal deficit present.     Mental Status: She is alert and oriented to person, place, and time. Mental status is at baseline.  Psychiatric:        Mood and Affect: Mood normal.        Behavior: Behavior normal.      UC Treatments / Results  Labs (all labs ordered are listed, but only abnormal results are displayed) Labs Reviewed  SARS CORONAVIRUS 2 (TAT 6-24 HRS)    EKG   Radiology No results found.  Procedures Procedures (including critical care time)  Medications Ordered in UC Medications - No data to display  Initial Impression / Assessment and Plan / UC Course  I have reviewed the triage vital signs and the nursing notes.  Pertinent labs & imaging results that were available during my care of the patient were reviewed by me and considered in my medical decision making (see chart for details).     Patient presents with symptoms likely from a viral upper respiratory infection.  Do not suspect underlying cardiopulmonary process. Symptoms seem unlikely related to ACS, CHF or COPD exacerbations, pneumonia, pneumothorax. Patient is nontoxic appearing and not in need of emergent medical intervention. Covid test pending.   Recommended symptom control with medications, supportive care, fluids, rest. Flonase and benzonatate prescribed for patient.   Return if symptoms fail to improve in 1-2 weeks or  you develop shortness of breath, chest pain, severe headache. Patient states understanding and is agreeable.  Discharged with PCP followup.  Final Clinical Impressions(s) / UC Diagnoses   Final diagnoses:  Viral upper respiratory tract infection with cough     Discharge Instructions      Suspect that you have a viral illness as we discussed.  Two medications have been prescribed to help alleviate your symptoms.  COVID test is pending.    ED Prescriptions     Medication Sig Dispense Auth. Provider   benzonatate (TESSALON) 100 MG capsule Take 1 capsule (100 mg total) by mouth every 8 (eight) hours as needed for cough. 21 capsule [place], [place] E, [place]   fluticasone [facility]) 50 MCG/ACT nasal spray Place 1 spray into both nostrils daily. 16 g Gustavus Bryant, [place]      PDMP not reviewed this encounter.   Gustavus Bryant, [place] 03/24/23 [phone]

## 2023-03-24 NOTE — ED Triage Notes (Signed)
Persistent, constant cough, headache, body aches since thursday

## 2023-03-25 LAB — SARS CORONAVIRUS 2 (TAT 6-24 HRS): SARS Coronavirus 2: NEGATIVE

## 2023-03-27 ENCOUNTER — Encounter: Payer: Medicaid Other | Admitting: Physical Medicine and Rehabilitation

## 2023-04-01 ENCOUNTER — Encounter: Payer: Medicaid Other | Admitting: Gastroenterology

## 2023-04-03 ENCOUNTER — Other Ambulatory Visit: Payer: Self-pay

## 2023-04-03 ENCOUNTER — Ambulatory Visit: Payer: Medicaid Other | Admitting: Physical Medicine and Rehabilitation

## 2023-04-03 DIAGNOSIS — M5416 Radiculopathy, lumbar region: Secondary | ICD-10-CM | POA: Diagnosis not present

## 2023-04-03 MED ORDER — METHYLPREDNISOLONE ACETATE 40 MG/ML IJ SUSP
40.0000 mg | Freq: Once | INTRAMUSCULAR | Status: AC
  Administered 2023-04-03: 40 mg

## 2023-04-03 NOTE — Progress Notes (Unsigned)
Functional Pain Scale - descriptive words and definitions  Uncomfortable (3)  Pain is present but can complete all ADL's/sleep is slightly affected and passive distraction only gives marginal relief. Mild range order  Average Pain 3  126/83 +Driver, -BT, -Dye Allergies.

## 2023-04-03 NOTE — Patient Instructions (Signed)

## 2023-04-04 NOTE — Progress Notes (Signed)
Sally Reid - 62 y.o. female MRN [id number]  Date of birth: Aug 02, 1960  Office Visit Note: Visit Date: 04/03/2023 PCP: Storm Frisk, MD Referred by: Storm Frisk, MD  Subjective: Chief Complaint  Patient presents with   Lower Back - Pain   HPI:  Sally Reid is a 63 y.o. female who comes in today at the request of Ellin Goodie, FNP and Dr. Aldean Baker for planned Left L5-S1 Lumbar Interlaminar epidural steroid injection with fluoroscopic guidance.  The patient has failed conservative care including home exercise, medications, time and activity modification.  This injection will be diagnostic and hopefully therapeutic.  Please see requesting physician notes for further details and justification.   ROS Otherwise per HPI.  Assessment & Plan: Visit Diagnoses:    ICD-10-CM   1. Lumbar radiculopathy  M54.16 XR C-ARM NO REPORT    Epidural Steroid injection    methylPREDNISolone acetate (DEPO-MEDROL) injection 40 mg      Plan: No additional findings.   Meds & Orders:  Meds ordered this encounter  Medications   methylPREDNISolone acetate (DEPO-MEDROL) injection 40 mg    Orders Placed This Encounter  Procedures   XR C-ARM NO REPORT   Epidural Steroid injection    Follow-up: Return for visit to requesting provider as needed.   Procedures: No procedures performed  Lumbar Epidural Steroid Injection - Interlaminar Approach with Fluoroscopic Guidance  [name]      Date of Birth: 01-Mar-1961 MRN: [id number] PCP: Storm Frisk, MD      Visit Date: 04/03/2023   Universal Protocol:     Consent Given By: the patient  Position: PRONE  Additional Comments: Vital signs were monitored before and after the procedure. Patient was prepped and draped in the usual sterile fashion. The correct patient, procedure, and site was verified.   Injection Procedure Details:   Procedure diagnoses: Lumbar radiculopathy [M54.16]   Meds Administered:  Meds  ordered this encounter  Medications   methylPREDNISolone acetate (DEPO-MEDROL) injection 40 mg     Laterality: Left  Location/Site:  L5-S1  Needle: 4.5 in., 20 ga. Tuohy  Needle Placement: Paramedian epidural  Findings:   -Comments: Excellent flow of contrast into the epidural space.  Procedure Details: Using a paramedian approach from the side mentioned above, the region overlying the inferior lamina was localized under fluoroscopic visualization and the soft tissues overlying this structure were infiltrated with 4 ml. of 1% Lidocaine without Epinephrine. The Tuohy needle was inserted into the epidural space using a paramedian approach.   The epidural space was localized using loss of resistance along with counter oblique bi-planar fluoroscopic views.  After negative aspirate for air, blood, and CSF, a 2 ml. volume of Isovue-250 was injected into the epidural space and the flow of contrast was observed. Radiographs were obtained for documentation purposes.    The injectate was administered into the level noted above.   Additional Comments:  No complications occurred Dressing: 2 x 2 sterile gauze and Band-Aid    Post-procedure details: Patient was observed during the procedure. Post-procedure instructions were reviewed.  Patient left the clinic in stable condition.   Clinical History: EXAM: MRI LUMBAR SPINE WITHOUT CONTRAST   TECHNIQUE: Multiplanar, multisequence MR imaging of the lumbar spine was performed. No intravenous contrast was administered.   COMPARISON:  Radiographs of the lumbar spine 08/21/2021 (images available, report unavailable).   FINDINGS: Segmentation: 5 lumbar vertebrae. The caudal most well-formed into the vol disc space is designated L5-S1.   Alignment:  Slight grade 1 retrolisthesis at L2-L3 and L3-L4. Slight grade 1 anterolisthesis at L4-L5 and L5-S1.   Vertebrae: Vertebral body height is maintained. Mild marrow edema within the posterior  elements bilaterally at L4-L5 and L5-S1, likely degenerative and related to facet arthrosis. Minimal degenerative endplate edema at Z61-W96, T11-T12, T12-L1 and L5-S1.   Conus medullaris and cauda equina: Conus extends to the L1-L2 level. No signal abnormality identified within the visualized distal spinal cord.   Paraspinal and other soft tissues: No abnormality identified within included portions of the abdomen/retroperitoneum. No paraspinal mass or collection.   Disc levels:   No more than mild disc degeneration.   T10-T11: This level is imaged in the sagittal plane only. Slight disc bulge. No significant spinal canal or foraminal stenosis.   T11-T12: This level is imaged in the sagittal plane only. Small central disc protrusion. Mild effacement of ventral thecal sac (without spinal cord mass effect). No significant foraminal stenosis.   T12-L1: 10 mm central/right subarticular disc extrusion. The disc extrusion focally effaces the ventral thecal sac and likely contacts the ventral aspect of the spinal cord. The dorsal CSF space is maintained within the spinal canal. No significant foraminal stenosis.   L1-L2: Central posterior annular fissure. Small disc bulge. No significant spinal canal or foraminal stenosis.   L2-L3: Slight grade 1 retrolisthesis. Small disc bulge. No significant spinal canal or foraminal stenosis.   L3-L4: Slight grade 1 retrolisthesis. Small disc bulge. No significant spinal canal or foraminal stenosis.   L4-L5: Slight grade 1 anterolisthesis. Slight disc uncovering with disc bulge. Mild to moderate facet arthrosis with ligamentum flavum hypertrophy. No significant spinal canal stenosis. Mild relative bilateral neural foraminal narrowing.   L5-S1: Slight grade 1 anterolisthesis. Slight disc uncovering with disc bulge. Moderate facet arthrosis with ligamentum flavum hypertrophy. No significant spinal canal stenosis. Mild relative bilateral  neural foraminal narrowing.   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   At T12-L1, there is a 10 mm central/right subarticular disc extrusion which focally effaces the ventral thecal sac and likely contacts the ventral aspect of the spinal cord.   At L4-L5, there is slight grade 1 anterolisthesis. Slight disc uncovering with disc bulge. Mild-to-moderate facet arthrosis with ligamentum flavum hypertrophy. No significant spinal canal stenosis. Mild relative bilateral neural foraminal narrowing.   At L5-S1, there is slight grade 1 anterolisthesis. Slight disc uncovering with disc bulge. Moderate facet arthrosis with ligamentum flavum hypertrophy. No significant spinal canal stenosis. Mild relative bilateral neural foraminal narrowing.   Mild marrow edema within the posterior elements bilaterally at L4-L5 and L5-S1, likely degenerative and related to facet arthrosis.     Electronically Signed   By: Jackey Loge D.O.   On: 01/10/2022 14:39     Objective:  VS:  HT:    WT:   BMI:     BP:   HR: bpm  TEMP: ( )  RESP:  Physical Exam Vitals and nursing note reviewed.  Constitutional:      General: She is not in acute distress.    Appearance: Normal appearance. She is not ill-appearing.  HENT:     Head: Normocephalic and atraumatic.     Right Ear: External ear normal.     Left Ear: External ear normal.  Eyes:     Extraocular Movements: Extraocular movements intact.  Cardiovascular:     Rate and Rhythm: Normal rate.     Pulses: Normal pulses.  Pulmonary:     Effort: Pulmonary effort is normal. No respiratory distress.  Abdominal:     General: There is no distension.     Palpations: Abdomen is soft.  Musculoskeletal:        General: Tenderness present.     Cervical back: Neck supple.     Right lower leg: No edema.     Left lower leg: No edema.     Comments: Patient has good distal  strength with no pain over the greater trochanters.  No clonus or focal weakness.  Skin:    Findings: No erythema, lesion or rash.  Neurological:     General: No focal deficit present.     Mental Status: She is alert and oriented to person, place, and time.     Sensory: No sensory deficit.     Motor: No weakness or abnormal muscle tone.     Coordination: Coordination normal.  Psychiatric:        Mood and Affect: Mood normal.        Behavior: Behavior normal.      Imaging: XR C-ARM NO REPORT  Result Date: 04/03/2023 Please see Notes tab for imaging impression.

## 2023-04-04 NOTE — Procedures (Signed)
Lumbar Epidural Steroid Injection - Interlaminar Approach with Fluoroscopic Guidance  [name]      Date of Birth: [birth year] MRN: [id number] PCP: Storm Frisk, MD      Visit Date: 04/03/2023   Universal Protocol:     Consent Given By: the patient  Position: PRONE  Additional Comments: Vital signs were monitored before and after the procedure. Patient was prepped and draped in the usual sterile fashion. The correct patient, procedure, and site was verified.   Injection Procedure Details:   Procedure diagnoses: Lumbar radiculopathy [M54.16]   Meds Administered:  Meds ordered this encounter  Medications   methylPREDNISolone acetate (DEPO-MEDROL) injection 40 mg     Laterality: Left  Location/Site:  L5-S1  Needle: 4.5 in., 20 ga. Tuohy  Needle Placement: Paramedian epidural  Findings:   -Comments: Excellent flow of contrast into the epidural space.  Procedure Details: Using a paramedian approach from the side mentioned above, the region overlying the inferior lamina was localized under fluoroscopic visualization and the soft tissues overlying this structure were infiltrated with 4 ml. of 1% Lidocaine without Epinephrine. The Tuohy needle was inserted into the epidural space using a paramedian approach.   The epidural space was localized using loss of resistance along with counter oblique bi-planar fluoroscopic views.  After negative aspirate for air, blood, and CSF, a 2 ml. volume of Isovue-250 was injected into the epidural space and the flow of contrast was observed. Radiographs were obtained for documentation purposes.    The injectate was administered into the level noted above.   Additional Comments:  No complications occurred Dressing: 2 x 2 sterile gauze and Band-Aid    Post-procedure details: Patient was observed during the procedure. Post-procedure instructions were reviewed.  Patient left the clinic in stable condition.

## 2023-05-05 ENCOUNTER — Encounter (HOSPITAL_COMMUNITY): Payer: Self-pay

## 2023-05-05 ENCOUNTER — Other Ambulatory Visit: Payer: Self-pay

## 2023-05-05 ENCOUNTER — Emergency Department (HOSPITAL_COMMUNITY): Payer: Medicaid Other

## 2023-05-05 ENCOUNTER — Emergency Department (HOSPITAL_COMMUNITY)
Admission: EM | Admit: 2023-05-05 | Discharge: 2023-05-05 | Disposition: A | Discharge status: Home / Self Care | Payer: Medicaid Other | Attending: Emergency Medicine | Admitting: Emergency Medicine

## 2023-05-05 DIAGNOSIS — Z1152 Encounter for screening for COVID-19: Secondary | ICD-10-CM | POA: Insufficient documentation

## 2023-05-05 DIAGNOSIS — J40 Bronchitis, not specified as acute or chronic: Secondary | ICD-10-CM | POA: Diagnosis not present

## 2023-05-05 DIAGNOSIS — R059 Cough, unspecified: Secondary | ICD-10-CM | POA: Diagnosis present

## 2023-05-05 LAB — RESP PANEL BY RT-PCR (RSV, FLU A&B, COVID)  RVPGX2
Influenza A by PCR: NEGATIVE
Influenza B by PCR: NEGATIVE
Resp Syncytial Virus by PCR: NEGATIVE
SARS Coronavirus 2 by RT PCR: NEGATIVE

## 2023-05-05 LAB — CBC
HCT: 39.4 % (ref 36.0–46.0)
Hemoglobin: 13.3 g/dL (ref 12.0–15.0)
MCH: 28.8 pg (ref 26.0–34.0)
MCHC: 33.8 g/dL (ref 30.0–36.0)
MCV: 85.3 fL (ref 80.0–100.0)
Platelets: 249 10*3/uL (ref 150–400)
RBC: 4.62 MIL/uL (ref 3.87–5.11)
RDW: 14.3 % (ref 11.5–15.5)
WBC: 6.1 10*3/uL (ref 4.0–10.5)
nRBC: 0 % (ref 0.0–0.2)

## 2023-05-05 LAB — BASIC METABOLIC PANEL
Anion gap: 7 (ref 5–15)
BUN: 12 mg/dL (ref 8–23)
CO2: 27 mmol/L (ref 22–32)
Calcium: 9 mg/dL (ref 8.9–10.3)
Chloride: 99 mmol/L (ref 98–111)
Creatinine, Ser: 0.73 mg/dL (ref 0.44–1.00)
GFR, Estimated: >60 mL/min (ref >60)
Glucose, Bld: 105 mg/dL — ABNORMAL HIGH (ref 70–99)
Potassium: 2.7 mmol/L — CL (ref 3.5–5.1)
Sodium: 133 mmol/L — ABNORMAL LOW (ref 135–145)

## 2023-05-05 MED ORDER — AZITHROMYCIN 250 MG PO TABS: 250.0000 mg | ORAL_TABLET | Freq: Every day | ORAL | 0 refills | Status: AC

## 2023-05-05 MED ORDER — KETOROLAC TROMETHAMINE 15 MG/ML IJ SOLN
15.0000 mg | Freq: Once | INTRAMUSCULAR | Status: AC
  Administered 2023-05-05: 15 mg via INTRAVENOUS
  Filled 2023-05-05: qty 1

## 2023-05-05 MED ORDER — AZITHROMYCIN 250 MG PO TABS
500.0000 mg | ORAL_TABLET | Freq: Once | ORAL | Status: AC
  Administered 2023-05-05: 500 mg via ORAL
  Filled 2023-05-05: qty 2

## 2023-05-05 MED ORDER — ALBUTEROL SULFATE HFA 108 (90 BASE) MCG/ACT IN AERS
2.0000 | INHALATION_SPRAY | Freq: Once | RESPIRATORY_TRACT | Status: AC
  Administered 2023-05-05: 2 via RESPIRATORY_TRACT
  Filled 2023-05-05: qty 6.7

## 2023-05-05 MED ORDER — POTASSIUM CHLORIDE 20 MEQ PO PACK
40.0000 meq | PACK | Freq: Once | ORAL | Status: AC
  Administered 2023-05-05: 40 meq via ORAL
  Filled 2023-05-05: qty 2

## 2023-05-05 MED ORDER — POTASSIUM CHLORIDE 10 MEQ/100ML IV SOLN
10.0000 meq | Freq: Once | INTRAVENOUS | Status: AC
  Administered 2023-05-05: 10 meq via INTRAVENOUS
  Filled 2023-05-05: qty 100

## 2023-05-05 MED ORDER — ACETAMINOPHEN 500 MG PO TABS
1000.0000 mg | ORAL_TABLET | Freq: Once | ORAL | Status: AC
  Administered 2023-05-05: 1000 mg via ORAL
  Filled 2023-05-05: qty 2

## 2023-05-05 NOTE — ED Triage Notes (Signed)
Patient reports cough, congestion, SOB, and chest pain x 1 week. Denies fevers.

## 2023-05-05 NOTE — ED Provider Notes (Signed)
[place] EMERGENCY DEPARTMENT AT [facility] Provider Note   CSN: [id number] Arrival date & time: 05/05/23  1516     History Chief Complaint  Patient presents with   Cough    HPI Sally Reid is a 62 y.o. female presenting for 7 days of fever cough congestion.  States that she has been getting increasingly worse. Denies nausea vomiting abdominal pain or chest pain at this time.  Otherwise ambulatory tolerating p.o. intake.  Smokes daily.   Patient's recorded medical, surgical, social, medication list and allergies were reviewed in the Snapshot window as part of the initial history.   Review of Systems   Review of Systems  Constitutional:  Positive for fever. Negative for chills.  HENT:  Positive for congestion. Negative for ear pain and sore throat.   Eyes:  Negative for pain and visual disturbance.  Respiratory:  Positive for cough. Negative for shortness of breath.   Cardiovascular:  Negative for chest pain and palpitations.  Gastrointestinal:  Negative for abdominal pain and vomiting.  Genitourinary:  Negative for dysuria and hematuria.  Musculoskeletal:  Negative for arthralgias and back pain.  Skin:  Negative for color change and rash.  Neurological:  Negative for seizures and syncope.  All other systems reviewed and are negative.   Physical Exam Updated Vital Signs BP (!) 160/65   Pulse 82   Temp 98.7 F (37.1 C) (Oral)   Resp (!) 23   Ht 4\' 11"  (1.499 m)   Wt 99.3 kg   SpO2 100%   BMI 44.23 kg/m  Physical Exam Vitals and nursing note reviewed.  Constitutional:      General: She is not in acute distress.    Appearance: She is well-developed.  HENT:     Head: Normocephalic and atraumatic.  Eyes:     Conjunctiva/sclera: Conjunctivae normal.  Cardiovascular:     Rate and Rhythm: Normal rate and regular rhythm.     Heart sounds: No murmur heard. Pulmonary:     Effort: Pulmonary effort is normal. No respiratory distress.     Breath  sounds: Rhonchi present.  Abdominal:     General: There is no distension.     Palpations: Abdomen is soft.     Tenderness: There is no abdominal tenderness. There is no right CVA tenderness or left CVA tenderness.  Musculoskeletal:        General: No swelling or tenderness. Normal range of motion.     Cervical back: Neck supple.  Skin:    General: Skin is warm and dry.  Neurological:     General: No focal deficit present.     Mental Status: She is alert and oriented to person, place, and time. Mental status is at baseline.     Cranial Nerves: No cranial nerve deficit.      ED Course/ Medical Decision Making/ A&P Clinical Course as of 05/05/23 2012  Sun May 05, 2023  1704 Fever cough congestion x 4 days worse today [CC]    Clinical Course User Index [CC] Glyn Ade, MD    Procedures Procedures   Medications Ordered in ED Medications  potassium chloride 10 mEq in 100 mL IVPB (10 mEq Intravenous New Bag/Given 05/05/23 1746)  potassium chloride (KLOR-CON) packet 40 mEq (40 mEq Oral Given 05/05/23 1745)  azithromycin (ZITHROMAX) tablet 500 mg (500 mg Oral Given 05/05/23 1745)  albuterol (VENTOLIN HFA) 108 (90 Base) MCG/ACT inhaler 2 puff (2 puffs Inhalation Given 05/05/23 1745)  acetaminophen (TYLENOL) tablet 1,000 mg (1,000  mg Oral Given 05/05/23 1941)  ketorolac (TORADOL) 15 MG/ML injection 15 mg (15 mg Intravenous Given 05/05/23 1942)    Medical Decision Making:    Sally Reid is a 62 y.o. female who presented to the ED today with fever cough congestion detailed above.     Additional history discussed with patient's family/caregivers.  Patient placed on continuous vitals and telemetry monitoring while in ED which was reviewed periodically.   Complete initial physical exam performed, notably the patient  was hemodynamically stable no acute distress.      Reviewed and confirmed nursing documentation for past medical history, family history, social history.     Initial Assessment:   With the patient's presentation of Fever cough congestion, most likely diagnosis is bronchitis versus pneumonia versus viral infection. Other diagnoses were considered including (but not limited to) ACS, PE. These are considered less likely due to history of present illness and physical exam findings.   This is most consistent with an acute life/limb threatening illness complicated by underlying chronic conditions.  Initial Plan:  Screening labs including CBC and Metabolic panel to evaluate for infectious or metabolic etiology of disease.  CXR to evaluate for structural/infectious intrathoracic pathology.  EKG to evaluate for cardiac pathology. Objective evaluation as below reviewed with plan for close reassessment  Initial Study Results:   Laboratory  All laboratory results reviewed without evidence of clinically relevant pathology.   Exception includes hypokalemia in the setting of chlorthalidone use  EKG EKG was reviewed independently. Rate, rhythm, axis, intervals all examined and without medically relevant abnormality. ST segments without concerns for elevations.    Radiology  All images reviewed independently. Agree with radiology report at this time.   DG Chest Portable 1 View  Result Date: 05/05/2023 CLINICAL DATA:  Shortness of breath EXAM: PORTABLE CHEST 1 VIEW COMPARISON:  X-ray 12/27/2022. FINDINGS: The heart size and mediastinal contours are within normal limits. Both lungs are clear. No consolidation, pneumothorax or effusion. No edema. The visualized skeletal structures are unremarkable. Overlapping cardiac leads. IMPRESSION: No acute cardiopulmonary disease. Electronically Signed   By: Karen Kays M.D.   On: 05/05/2023 16:09     Reassessment and Plan:   Patient's history of present illness and physical exam findings are most consistent with bronchitis given the findings.  Given duration of symptoms greater than 5 days, will treat with azithromycin.   Will replete potassium and treat with breathing treatment given her longstanding history of smoking and trace rhonchi/wheezes. Will plan for reassessment after these medications.  Reassessment: Improved on reassessment. Feels comfortable outpatient care management.  Disposition:  I have considered need for hospitalization, however, considering all of the above, I believe this patient is stable for discharge at this time.  Patient/family educated about specific return precautions for given chief complaint and symptoms.  Patient/family educated about follow-up with PCP.     Patient/family expressed understanding of return precautions and need for follow-up. Patient spoken to regarding all imaging and laboratory results and appropriate follow up for these results. All education provided in verbal form with additional information in written form. Time was allowed for answering of patient questions. Patient discharged.    Emergency Department Medication Summary:   Medications  potassium chloride 10 mEq in 100 mL IVPB (10 mEq Intravenous New Bag/Given 05/05/23 1746)  potassium chloride (KLOR-CON) packet 40 mEq (40 mEq Oral Given 05/05/23 1745)  azithromycin (ZITHROMAX) tablet 500 mg (500 mg Oral Given 05/05/23 1745)  albuterol (VENTOLIN HFA) 108 (90 Base) MCG/ACT inhaler 2  puff (2 puffs Inhalation Given 05/05/23 1745)  acetaminophen (TYLENOL) tablet 1,000 mg (1,000 mg Oral Given 05/05/23 1941)  ketorolac (TORADOL) 15 MG/ML injection 15 mg (15 mg Intravenous Given 05/05/23 1942)        Clinical Impression:  1. Bronchitis      Discharge   Final Clinical Impression(s) / ED Diagnoses Final diagnoses:  Bronchitis    Rx / DC Orders ED Discharge Orders          Ordered    azithromycin (ZITHROMAX) 250 MG tablet  Daily        05/05/23 2009              Glyn Ade, MD 05/05/23 2012

## 2023-05-21 ENCOUNTER — Encounter: Payer: Self-pay | Admitting: Gastroenterology

## 2023-05-22 ENCOUNTER — Encounter: Payer: Medicaid Other | Admitting: Gastroenterology

## 2023-06-13 ENCOUNTER — Other Ambulatory Visit (INDEPENDENT_AMBULATORY_CARE_PROVIDER_SITE_OTHER): Payer: Self-pay

## 2023-06-13 ENCOUNTER — Ambulatory Visit (INDEPENDENT_AMBULATORY_CARE_PROVIDER_SITE_OTHER): Payer: Medicaid Other | Admitting: Orthopedic Surgery

## 2023-06-13 DIAGNOSIS — G8929 Other chronic pain: Secondary | ICD-10-CM

## 2023-06-13 DIAGNOSIS — M25511 Pain in right shoulder: Secondary | ICD-10-CM | POA: Diagnosis not present

## 2023-06-13 DIAGNOSIS — M25561 Pain in right knee: Secondary | ICD-10-CM | POA: Diagnosis not present

## 2023-06-17 ENCOUNTER — Encounter: Payer: Self-pay | Admitting: Orthopedic Surgery

## 2023-06-17 DIAGNOSIS — G8929 Other chronic pain: Secondary | ICD-10-CM | POA: Diagnosis not present

## 2023-06-17 DIAGNOSIS — M25561 Pain in right knee: Secondary | ICD-10-CM

## 2023-06-17 MED ORDER — LIDOCAINE HCL (PF) 1 % IJ SOLN
5.0000 mL | INTRAMUSCULAR | Status: AC | PRN
  Administered 2023-06-17: 5 mL

## 2023-06-17 MED ORDER — METHYLPREDNISOLONE ACETATE 40 MG/ML IJ SUSP
40.0000 mg | INTRAMUSCULAR | Status: AC | PRN
  Administered 2023-06-17: 40 mg via INTRA_ARTICULAR

## 2023-06-17 NOTE — Progress Notes (Signed)
 Office Visit Note   [name]           Date of Birth: 05/22/1961           MRN: [id number] Visit Date: 06/13/2023              Requested by: Brien Belvie BRAVO, MD [address] [place],  [place] [address] PCP: Brien Belvie BRAVO, MD  Chief Complaint  Patient presents with   Right Knee - Pain   Right Shoulder - Pain      HPI: Patient is a 63 year old woman who presents with right shoulder and right knee pain.  She states the right knee is giving out on her.  Patient states the right shoulder injection did not help.  Assessment & Plan: Visit Diagnoses:  1. Chronic pain of right knee   2. Chronic right shoulder pain     Plan: Will obtain an MRI scan of the right shoulder.  Right knee was injected.  Follow-Up Instructions: Return in about 4 weeks (around 07/11/2023).   Ortho Exam  Patient is alert, oriented, no adenopathy, well-dressed, normal affect, normal respiratory effort. Examination patient has full range of motion of the right shoulder.  She has pain with Neer and Hawkins impingement test pain to palpation over the biceps tendon.  Examination of the right knee collaterals and cruciates are stable there is crepitation in the patellofemoral joint with range of motion there is tenderness to palpation over the medial joint line.  Imaging: No results found. No images are attached to the encounter.  Labs: Lab Results  Component Value Date   HGBA1C  04/11/2009    5.2 (NOTE) The ADA recommends the following therapeutic goal for glycemic control related to Hgb A1c measurement: Goal of therapy: <6.5 Hgb A1c  Reference: American Diabetes Association: Clinical Practice Recommendations 2010, Diabetes Care, 2010, 33: (Suppl  1).   REPTSTATUS 03/15/2020 FINAL 03/14/2020   CULT (A) 03/14/2020    <10,000 COLONIES/mL INSIGNIFICANT GROWTH Performed at [facility], [address] [place], [place] [address]      Lab Results  Component Value Date    ALBUMIN 3.4 (L) 12/27/2022   ALBUMIN 3.5 04/07/2021   ALBUMIN 3.5 03/14/2020    Lab Results  Component Value Date   MG 2.0 04/30/2022   MG 1.9 04/11/2009   No results found for: VD25OH  No results found for: PREALBUMIN    Latest Ref Rng & Units 05/05/2023    3:50 PM 12/27/2022   11:04 AM 06/13/2022    9:24 AM  CBC EXTENDED  WBC 4.0 - 10.5 K/uL 6.1  6.1  5.4   RBC 3.87 - 5.11 MIL/uL 4.62  4.83  4.62   Hemoglobin 12.0 - 15.0 g/dL 86.6  86.5  86.4   HCT 36.0 - 46.0 % 39.4  40.8  39.7   Platelets 150 - 400 K/uL 249  232  253   NEUT# 1.7 - 7.7 K/uL  3.8    Lymph# 0.7 - 4.0 K/uL  1.9       There is no height or weight on file to calculate BMI.  Orders:  Orders Placed This Encounter  Procedures   XR Knee 1-2 Views Right   MR SHOULDER RIGHT WO CONTRAST   No orders of the defined types were placed in this encounter.    Procedures: Large Joint Inj: R knee on 06/17/2023 9:29 AM Indications: pain and diagnostic evaluation Details: 22 G 1.5 in needle, anteromedial  approach  Arthrogram: No  Medications: 5 mL lidocaine  (PF) 1 %; 40 mg methylPREDNISolone  acetate 40 MG/ML Outcome: tolerated well, no immediate complications Procedure, treatment alternatives, risks and benefits explained, specific risks discussed. Consent was given by the patient. Immediately prior to procedure a time out was called to verify the correct patient, procedure, equipment, support staff and site/side marked as required. Patient was prepped and draped in the usual sterile fashion.      Clinical Data: No additional findings.  ROS:  All other systems negative, except as noted in the HPI. Review of Systems  Objective: Vital Signs: There were no vitals taken for this visit.  Specialty Comments:  EXAM: MRI LUMBAR SPINE WITHOUT CONTRAST   TECHNIQUE: Multiplanar, multisequence MR imaging of the lumbar spine was performed. No intravenous contrast was administered.   COMPARISON:  Radiographs  of the lumbar spine 08/21/2021 (images available, report unavailable).   FINDINGS: Segmentation: 5 lumbar vertebrae. The caudal most well-formed into the vol disc space is designated L5-S1.   Alignment: Slight grade 1 retrolisthesis at L2-L3 and L3-L4. Slight grade 1 anterolisthesis at L4-L5 and L5-S1.   Vertebrae: Vertebral body height is maintained. Mild marrow edema within the posterior elements bilaterally at L4-L5 and L5-S1, likely degenerative and related to facet arthrosis. Minimal degenerative endplate edema at U89-U88, T11-T12, T12-L1 and L5-S1.   Conus medullaris and cauda equina: Conus extends to the L1-L2 level. No signal abnormality identified within the visualized distal spinal cord.   Paraspinal and other soft tissues: No abnormality identified within included portions of the abdomen/retroperitoneum. No paraspinal mass or collection.   Disc levels:   No more than mild disc degeneration.   T10-T11: This level is imaged in the sagittal plane only. Slight disc bulge. No significant spinal canal or foraminal stenosis.   T11-T12: This level is imaged in the sagittal plane only. Small central disc protrusion. Mild effacement of ventral thecal sac (without spinal cord mass effect). No significant foraminal stenosis.   T12-L1: 10 mm central/right subarticular disc extrusion. The disc extrusion focally effaces the ventral thecal sac and likely contacts the ventral aspect of the spinal cord. The dorsal CSF space is maintained within the spinal canal. No significant foraminal stenosis.   L1-L2: Central posterior annular fissure. Small disc bulge. No significant spinal canal or foraminal stenosis.   L2-L3: Slight grade 1 retrolisthesis. Small disc bulge. No significant spinal canal or foraminal stenosis.   L3-L4: Slight grade 1 retrolisthesis. Small disc bulge. No significant spinal canal or foraminal stenosis.   L4-L5: Slight grade 1 anterolisthesis. Slight disc  uncovering with disc bulge. Mild to moderate facet arthrosis with ligamentum flavum hypertrophy. No significant spinal canal stenosis. Mild relative bilateral neural foraminal narrowing.   L5-S1: Slight grade 1 anterolisthesis. Slight disc uncovering with disc bulge. Moderate facet arthrosis with ligamentum flavum hypertrophy. No significant spinal canal stenosis. Mild relative bilateral neural foraminal narrowing.   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   At T12-L1, there is a 10 mm central/right subarticular disc extrusion which focally effaces the ventral thecal sac and likely contacts the ventral aspect of the spinal cord.   At L4-L5, there is slight grade 1 anterolisthesis. Slight disc uncovering with disc bulge. Mild-to-moderate facet arthrosis with ligamentum flavum hypertrophy. No significant spinal canal stenosis. Mild relative bilateral neural foraminal narrowing.   At L5-S1, there is slight grade 1 anterolisthesis. Slight disc uncovering with disc bulge. Moderate facet arthrosis  with ligamentum flavum hypertrophy. No significant spinal canal stenosis. Mild relative bilateral neural foraminal narrowing.   Mild marrow edema within the posterior elements bilaterally at L4-L5 and L5-S1, likely degenerative and related to facet arthrosis.     Electronically Signed   By: Rockey Childs D.O.   On: 01/10/2022 14:39  PMFS History: Patient Active Problem List   Diagnosis Date Noted   Chronic hepatitis C without hepatic coma (HCC) 04/21/2021   Left leg pain 04/18/2021   History of ankle fracture 04/18/2021   Tobacco use 04/18/2021   Essential hypertension 03/23/2020   Fatty liver 03/23/2020   Morbid obesity with BMI of 45.0-49.9, adult (HCC) 03/23/2020   Multiple thyroid  nodules 07/14/2018   Past Medical History:  Diagnosis Date   Arthritis    back/R shoulder   Hypertension     on meds    Family History  Problem Relation Age of Onset   Stomach cancer Mother 34   Diabetes Mother    Hypertension Mother    Hodgkin's lymphoma Brother 81   Colon polyps Neg Hx    Colon cancer Neg Hx    Esophageal cancer Neg Hx    Rectal cancer Neg Hx     Past Surgical History:  Procedure Laterality Date   ABDOMINAL HYSTERECTOMY  2005   ANKLE FRACTURE SURGERY Left 2011   CESAREAN SECTION     2 previous   KNEE ARTHROSCOPY Left 2008   for cartilage repair   LEG SURGERY  2011   TUBAL LIGATION     Social History   Occupational History   Occupation: unemployed    Comment: without health insurance  Tobacco Use   Smoking status: Some Days    Current packs/day: 0.10    Types: Cigarettes   Smokeless tobacco: Never   Tobacco comments:    Smokes 2 cigarettes/day - cutting back  Vaping Use   Vaping status: Never Used  Substance and Sexual Activity   Alcohol use: Yes    Alcohol/week: 3.0 standard drinks of alcohol    Types: 3 Standard drinks or equivalent per week    Comment: occassionally   Drug use: No   Sexual activity: Yes    Birth control/protection: Surgical

## 2023-06-19 ENCOUNTER — Encounter: Payer: Self-pay | Admitting: Orthopedic Surgery

## 2023-06-24 ENCOUNTER — Ambulatory Visit (AMBULATORY_SURGERY_CENTER): Payer: Medicaid Other

## 2023-06-24 ENCOUNTER — Telehealth: Payer: Self-pay | Admitting: Gastroenterology

## 2023-06-24 ENCOUNTER — Other Ambulatory Visit: Payer: Self-pay

## 2023-06-24 VITALS — Ht 59.0 in | Wt 216.0 lb

## 2023-06-24 DIAGNOSIS — Z1211 Encounter for screening for malignant neoplasm of colon: Secondary | ICD-10-CM

## 2023-06-24 MED ORDER — SUFLAVE 178.7 G PO SOLR: 1.0000 | ORAL | 0 refills | Status: AC

## 2023-06-24 NOTE — Telephone Encounter (Signed)
Previsit is currently in progress per Janalee Dane, LPN

## 2023-06-24 NOTE — Telephone Encounter (Signed)
PT is calling 234pm to have PV. She said she missed the first call. Please advise.

## 2023-06-24 NOTE — Progress Notes (Signed)
Denies allergies to eggs or soy products. Denies complication of anesthesia or sedation. Denies use of weight loss medication. Denies use of O2.   Emmi instructions given for colonoscopy.  

## 2023-06-26 ENCOUNTER — Other Ambulatory Visit: Payer: Medicaid Other

## 2023-07-08 ENCOUNTER — Ambulatory Visit
Admission: RE | Admit: 2023-07-08 | Discharge: 2023-07-08 | Disposition: A | Discharge status: Home / Self Care | Payer: Medicaid Other | Source: Ambulatory Visit | Attending: Orthopedic Surgery | Admitting: Orthopedic Surgery

## 2023-07-08 DIAGNOSIS — G8929 Other chronic pain: Secondary | ICD-10-CM

## 2023-07-11 ENCOUNTER — Ambulatory Visit: Payer: Medicaid Other | Admitting: Orthopedic Surgery

## 2023-07-11 DIAGNOSIS — M25561 Pain in right knee: Secondary | ICD-10-CM | POA: Diagnosis not present

## 2023-07-11 DIAGNOSIS — G8929 Other chronic pain: Secondary | ICD-10-CM

## 2023-07-11 DIAGNOSIS — M25511 Pain in right shoulder: Secondary | ICD-10-CM

## 2023-07-15 ENCOUNTER — Encounter: Payer: Self-pay | Admitting: Orthopedic Surgery

## 2023-07-15 NOTE — Progress Notes (Signed)
 Office Visit Note   [name]           Date of Birth: [birth year]           MRN: [id number] Visit Date: 07/11/2023              Requested by: Brien Belvie BRAVO, MD [address] [place],  [place] [address] PCP: Brien Belvie BRAVO, MD  Chief Complaint  Patient presents with   Right Shoulder - Follow-up   Right Knee - Follow-up      HPI: Patient is a 63 year old woman with chronic right knee and right shoulder pain.  Patient is status post right knee injection January 9.  Patient states that her knee is better but still has pain in the right shoulder.  Assessment & Plan: Visit Diagnoses:  1. Chronic pain of right knee   2. Chronic right shoulder pain     Plan: Will refer patient to follow-up with Dr. Addie to evaluate for surgical intervention for the rotator cuff tear right shoulder.  Follow-Up Instructions: Return if symptoms worsen or fail to improve.   Ortho Exam  Patient is alert, oriented, no adenopathy, well-dressed, normal affect, normal respiratory effort. Examination of the right shoulder patient has pain to palpation of the biceps she has pain with Neer and Hawkins impingement test.  She has abduction to 90 degrees.  Patient has pain with internal rotation.  She states she cannot put her close on or fasten his seatbelt.  Review of the MRI scan shows rotator cuff tear.  Imaging: No results found. No images are attached to the encounter.  Labs: Lab Results  Component Value Date   HGBA1C  04/11/2009    5.2 (NOTE) The ADA recommends the following therapeutic goal for glycemic control related to Hgb A1c measurement: Goal of therapy: <6.5 Hgb A1c  Reference: American Diabetes Association: Clinical Practice Recommendations 2010, Diabetes Care, 2010, 33: (Suppl  1).   REPTSTATUS 03/15/2020 FINAL 03/14/2020   CULT (A) 03/14/2020    <10,000 COLONIES/mL INSIGNIFICANT GROWTH Performed at [facility], [address] [place], [place]  [address]      Lab Results  Component Value Date   ALBUMIN 3.4 (L) 12/27/2022   ALBUMIN 3.5 04/07/2021   ALBUMIN 3.5 03/14/2020    Lab Results  Component Value Date   MG 2.0 04/30/2022   MG 1.9 04/11/2009   No results found for: VD25OH  No results found for: PREALBUMIN    Latest Ref Rng & Units 05/05/2023    3:50 PM 12/27/2022   11:04 AM 06/13/2022    9:24 AM  CBC EXTENDED  WBC 4.0 - 10.5 K/uL 6.1  6.1  5.4   RBC 3.87 - 5.11 MIL/uL 4.62  4.83  4.62   Hemoglobin 12.0 - 15.0 g/dL 86.6  86.5  86.4   HCT 36.0 - 46.0 % 39.4  40.8  39.7   Platelets 150 - 400 K/uL 249  232  253   NEUT# 1.7 - 7.7 K/uL  3.8    Lymph# 0.7 - 4.0 K/uL  1.9       There is no height or weight on file to calculate BMI.  Orders:  No orders of the defined types were placed in this encounter.  No orders of the defined types were placed in this encounter.    Procedures: No procedures performed  Clinical Data: No additional findings.  ROS:  All other systems negative, except as noted in the  HPI. Review of Systems  Objective: Vital Signs: There were no vitals taken for this visit.  Specialty Comments:  EXAM: MRI LUMBAR SPINE WITHOUT CONTRAST   TECHNIQUE: Multiplanar, multisequence MR imaging of the lumbar spine was performed. No intravenous contrast was administered.   COMPARISON:  Radiographs of the lumbar spine 08/21/2021 (images available, report unavailable).   FINDINGS: Segmentation: 5 lumbar vertebrae. The caudal most well-formed into the vol disc space is designated L5-S1.   Alignment: Slight grade 1 retrolisthesis at L2-L3 and L3-L4. Slight grade 1 anterolisthesis at L4-L5 and L5-S1.   Vertebrae: Vertebral body height is maintained. Mild marrow edema within the posterior elements bilaterally at L4-L5 and L5-S1, likely degenerative and related to facet arthrosis. Minimal degenerative endplate edema at U89-U88, T11-T12, T12-L1 and L5-S1.   Conus medullaris and cauda  equina: Conus extends to the L1-L2 level. No signal abnormality identified within the visualized distal spinal cord.   Paraspinal and other soft tissues: No abnormality identified within included portions of the abdomen/retroperitoneum. No paraspinal mass or collection.   Disc levels:   No more than mild disc degeneration.   T10-T11: This level is imaged in the sagittal plane only. Slight disc bulge. No significant spinal canal or foraminal stenosis.   T11-T12: This level is imaged in the sagittal plane only. Small central disc protrusion. Mild effacement of ventral thecal sac (without spinal cord mass effect). No significant foraminal stenosis.   T12-L1: 10 mm central/right subarticular disc extrusion. The disc extrusion focally effaces the ventral thecal sac and likely contacts the ventral aspect of the spinal cord. The dorsal CSF space is maintained within the spinal canal. No significant foraminal stenosis.   L1-L2: Central posterior annular fissure. Small disc bulge. No significant spinal canal or foraminal stenosis.   L2-L3: Slight grade 1 retrolisthesis. Small disc bulge. No significant spinal canal or foraminal stenosis.   L3-L4: Slight grade 1 retrolisthesis. Small disc bulge. No significant spinal canal or foraminal stenosis.   L4-L5: Slight grade 1 anterolisthesis. Slight disc uncovering with disc bulge. Mild to moderate facet arthrosis with ligamentum flavum hypertrophy. No significant spinal canal stenosis. Mild relative bilateral neural foraminal narrowing.   L5-S1: Slight grade 1 anterolisthesis. Slight disc uncovering with disc bulge. Moderate facet arthrosis with ligamentum flavum hypertrophy. No significant spinal canal stenosis. Mild relative bilateral neural foraminal narrowing.   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   At T12-L1, there is a 10 mm  central/right subarticular disc extrusion which focally effaces the ventral thecal sac and likely contacts the ventral aspect of the spinal cord.   At L4-L5, there is slight grade 1 anterolisthesis. Slight disc uncovering with disc bulge. Mild-to-moderate facet arthrosis with ligamentum flavum hypertrophy. No significant spinal canal stenosis. Mild relative bilateral neural foraminal narrowing.   At L5-S1, there is slight grade 1 anterolisthesis. Slight disc uncovering with disc bulge. Moderate facet arthrosis with ligamentum flavum hypertrophy. No significant spinal canal stenosis. Mild relative bilateral neural foraminal narrowing.   Mild marrow edema within the posterior elements bilaterally at L4-L5 and L5-S1, likely degenerative and related to facet arthrosis.     Electronically Signed   By: Rockey Childs D.O.   On: 01/10/2022 14:39  PMFS History: Patient Active Problem List   Diagnosis Date Noted   Chronic hepatitis C without hepatic coma (HCC) 04/21/2021   Left leg pain 04/18/2021   History of ankle fracture 04/18/2021   Tobacco use 04/18/2021  Essential hypertension 03/23/2020   Fatty liver 03/23/2020   Morbid obesity with BMI of 45.0-49.9, adult (HCC) 03/23/2020   Multiple thyroid  nodules 07/14/2018   Past Medical History:  Diagnosis Date   Arthritis    back/R shoulder   GERD (gastroesophageal reflux disease)    Hypertension    on meds    Family History  Problem Relation Age of Onset   Stomach cancer Mother 76   Diabetes Mother    Hypertension Mother    Stomach cancer Brother    Hodgkin's lymphoma Brother 18   Colon polyps Neg Hx    Colon cancer Neg Hx    Esophageal cancer Neg Hx    Rectal cancer Neg Hx     Past Surgical History:  Procedure Laterality Date   ABDOMINAL HYSTERECTOMY  2005   ANKLE FRACTURE SURGERY Left 2011   CESAREAN SECTION     2 previous   KNEE ARTHROSCOPY Left 2008   for cartilage repair   LEG SURGERY  2011   TUBAL LIGATION      Social History   Occupational History   Occupation: unemployed    Comment: without health insurance  Tobacco Use   Smoking status: Some Days    Current packs/day: 0.10    Types: Cigarettes   Smokeless tobacco: Never   Tobacco comments:    Smokes 2 cigarettes/day - cutting back  Vaping Use   Vaping status: Never Used  Substance and Sexual Activity   Alcohol use: Yes    Alcohol/week: 3.0 standard drinks of alcohol    Types: 3 Standard drinks or equivalent per week    Comment: occassionally   Drug use: No   Sexual activity: Yes    Birth control/protection: Surgical

## 2023-07-23 ENCOUNTER — Telehealth: Payer: Self-pay | Admitting: Gastroenterology

## 2023-07-23 NOTE — Telephone Encounter (Signed)
Patient called and stated that she would like to reschedule her procedure due to the inclement weather on tomorrow. Patient stated that she can not drive in snow. Patient was reschedule for April 9th at 8:30 AM. Please advise.

## 2023-07-24 ENCOUNTER — Encounter: Payer: Medicaid Other | Admitting: Gastroenterology

## 2023-07-26 ENCOUNTER — Ambulatory Visit: Payer: Medicaid Other | Admitting: Orthopedic Surgery

## 2023-07-26 ENCOUNTER — Encounter: Payer: Self-pay | Admitting: Orthopedic Surgery

## 2023-07-26 DIAGNOSIS — M75111 Incomplete rotator cuff tear or rupture of right shoulder, not specified as traumatic: Secondary | ICD-10-CM

## 2023-07-26 NOTE — Progress Notes (Signed)
Office Visit Note   [name]           Date of Birth: February 22, 1961           MRN: [id number] Visit Date: 07/26/2023 Requested by: Storm Frisk, MD [address] [place],  [place] [address] PCP: Storm Frisk, MD  Subjective: Chief Complaint  Patient presents with   Right Shoulder - Pain    HPI: Sally Reid is a 63 y.o. female who presents to the office reporting right shoulder pain 1 year duration.  She has had 1 subacromial injection.  Which helped her for a week.  Pain radiates up to the neck but not below the elbow.  Denies any numbness and tingling.  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  Shoulder used to have mechanical symptoms but now just reports pain.  The pain does wake her from sleep most nights.  She does take over-the-counter medications.  Has had an MRI scan which shows near full-thickness rotator cuff tear of the supraspinatus with mild biceps tendinosis.  Not too much in terms of glenohumeral arthritis.  Nonarthrogram study.  Patient does have a brother at home as well as 2 sons in the area.              ROS: All systems reviewed are negative as they relate to the chief complaint within the history of present illness.  Patient denies fevers or chills.  Assessment & Plan: Visit Diagnoses:  1. Nontraumatic incomplete tear of right rotator cuff     Plan: Impression is right shoulder pain refractory to nonoperative management.  MRI scan is reviewed with the patient.  She has a fairly significant spur which with abduction can be visualized to really compress that area of the partial-thickness rotator cuff tear.  Plan at this time would be arthroscopy with evaluation of the biceps tendon and a very low threshold for biceps tenodesis along with subacromial decompression and evaluation of the rotator cuff.  More than 50% thickness I would favor repair with patch augmentation in this patient to potentially prevent further rotator  cuff surgery.  The significantly painful aspect of this intervention is discussed with the patient.  The rehab time which could vary depending on whether or not we just do any type of arthroscopic subacromial decompression versus more extensive work on the rotator cuff is also discussed.  Risk and benefits are discussed including not limited to infection nerve or vessel damage incomplete pain relief as well as incomplete restoration of function as well as the extensive amount of time and effort required to get the shoulder back to a very functional level is discussed.  Patient understands the risk benefits and wishes to proceed.  All questions answered  Follow-Up Instructions: No follow-ups on file.   Orders:  No orders of the defined types were placed in this encounter.  No orders of the defined types were placed in this encounter.     Procedures: No procedures performed   Clinical Data: No additional findings.  Objective: Vital Signs: There were no vitals taken for this visit.  Physical Exam:  Constitutional: Patient appears well-developed HEENT:  Head: Normocephalic Eyes:EOM are normal Neck: Normal range of motion Cardiovascular: Normal rate Pulmonary/chest: Effort normal Neurologic: Patient is alert Skin: Skin is warm Psychiatric: Patient has normal mood and affect  Ortho Exam: Ortho exam demonstrates range of motion on the right of 70/110/180.  Range of motion on  the left is 60/95/165.  She has pretty good rotator cuff strength external rotation and internal rotation strength testing.  Mild tenderness in the bicipital groove on the right compared to the left no discrete AC joint tenderness right versus left.  O'Brien's testing equivocal on the right negative on the left.  She can get behind her back a little bit further on the left-hand side than the right.  No coarse popping or grinding with internal/external rotation at 90 degrees of abduction.  Specialty Comments:   EXAM: MRI LUMBAR SPINE WITHOUT CONTRAST   TECHNIQUE: Multiplanar, multisequence MR imaging of the lumbar spine was performed. No intravenous contrast was administered.   COMPARISON:  Radiographs of the lumbar spine 08/21/2021 (images available, report unavailable).   FINDINGS: Segmentation: 5 lumbar vertebrae. The caudal most well-formed into the vol disc space is designated L5-S1.   Alignment: Slight grade 1 retrolisthesis at L2-L3 and L3-L4. Slight grade 1 anterolisthesis at L4-L5 and L5-S1.   Vertebrae: Vertebral body height is maintained. Mild marrow edema within the posterior elements bilaterally at L4-L5 and L5-S1, likely degenerative and related to facet arthrosis. Minimal degenerative endplate edema at G95-A21, T11-T12, T12-L1 and L5-S1.   Conus medullaris and cauda equina: Conus extends to the L1-L2 level. No signal abnormality identified within the visualized distal spinal cord.   Paraspinal and other soft tissues: No abnormality identified within included portions of the abdomen/retroperitoneum. No paraspinal mass or collection.   Disc levels:   No more than mild disc degeneration.   T10-T11: This level is imaged in the sagittal plane only. Slight disc bulge. No significant spinal canal or foraminal stenosis.   T11-T12: This level is imaged in the sagittal plane only. Small central disc protrusion. Mild effacement of ventral thecal sac (without spinal cord mass effect). No significant foraminal stenosis.   T12-L1: 10 mm central/right subarticular disc extrusion. The disc extrusion focally effaces the ventral thecal sac and likely contacts the ventral aspect of the spinal cord. The dorsal CSF space is maintained within the spinal canal. No significant foraminal stenosis.   L1-L2: Central posterior annular fissure. Small disc bulge. No significant spinal canal or foraminal stenosis.   L2-L3: Slight grade 1 retrolisthesis. Small disc bulge. No significant  spinal canal or foraminal stenosis.   L3-L4: Slight grade 1 retrolisthesis. Small disc bulge. No significant spinal canal or foraminal stenosis.   L4-L5: Slight grade 1 anterolisthesis. Slight disc uncovering with disc bulge. Mild to moderate facet arthrosis with ligamentum flavum hypertrophy. No significant spinal canal stenosis. Mild relative bilateral neural foraminal narrowing.   L5-S1: Slight grade 1 anterolisthesis. Slight disc uncovering with disc bulge. Moderate facet arthrosis with ligamentum flavum hypertrophy. No significant spinal canal stenosis. Mild relative bilateral neural foraminal narrowing.   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   At T12-L1, there is a 10 mm central/right subarticular disc extrusion which focally effaces the ventral thecal sac and likely contacts the ventral aspect of the spinal cord.   At L4-L5, there is slight grade 1 anterolisthesis. Slight disc uncovering with disc bulge. Mild-to-moderate facet arthrosis with ligamentum flavum hypertrophy. No significant spinal canal stenosis. Mild relative bilateral neural foraminal narrowing.   At L5-S1, there is slight grade 1 anterolisthesis. Slight disc uncovering with disc bulge. Moderate facet arthrosis with ligamentum flavum hypertrophy. No significant spinal canal stenosis. Mild relative bilateral neural foraminal narrowing.   Mild marrow edema within the posterior elements bilaterally at L4-L5  and L5-S1, likely degenerative and related to facet arthrosis.     Electronically Signed   By: Jackey Loge D.O.   On: 01/10/2022 14:39  Imaging: No results found.   PMFS History: Patient Active Problem List   Diagnosis Date Noted   Chronic hepatitis C without hepatic coma (HCC) 04/21/2021   Left leg pain 04/18/2021   History of ankle fracture 04/18/2021   Tobacco use 04/18/2021   Essential  hypertension 03/23/2020   Fatty liver 03/23/2020   Morbid obesity with BMI of 45.0-49.9, adult (HCC) 03/23/2020   Multiple thyroid nodules 07/14/2018   Past Medical History:  Diagnosis Date   Arthritis    back/R shoulder   GERD (gastroesophageal reflux disease)    Hypertension    on meds    Family History  Problem Relation Age of Onset   Stomach cancer Mother 74   Diabetes Mother    Hypertension Mother    Stomach cancer Brother    Hodgkin's lymphoma Brother 55   Colon polyps Neg Hx    Colon cancer Neg Hx    Esophageal cancer Neg Hx    Rectal cancer Neg Hx     Past Surgical History:  Procedure Laterality Date   ABDOMINAL HYSTERECTOMY  2005   ANKLE FRACTURE SURGERY Left 2011   CESAREAN SECTION     2 previous   KNEE ARTHROSCOPY Left 2008   for cartilage repair   LEG SURGERY  2011   TUBAL LIGATION     Social History   Occupational History   Occupation: unemployed    Comment: without health insurance  Tobacco Use   Smoking status: Some Days    Current packs/day: 0.10    Types: Cigarettes   Smokeless tobacco: Never   Tobacco comments:    Smokes 2 cigarettes/day - cutting back  Vaping Use   Vaping status: Never Used  Substance and Sexual Activity   Alcohol use: Yes    Alcohol/week: 3.0 standard drinks of alcohol    Types: 3 Standard drinks or equivalent per week    Comment: occassionally   Drug use: No   Sexual activity: Yes    Birth control/protection: Surgical

## 2023-08-02 ENCOUNTER — Ambulatory Visit: Payer: Medicaid Other | Admitting: Orthopedic Surgery

## 2023-08-11 NOTE — Progress Notes (Unsigned)
 Established Patient Office Visit  Subjective:  Patient ID: Sally Reid, female    DOB: 07-Jul-1960  Age: 63 y.o. MRN: [id number]  Chief Complaint  Patient presents with   Annual Exam    Onset of sudden weakness this past Tuesday.     HPI 07/17/21 Sally Reid presents for primary care follow-up and obesity and hypertension.  On arrival blood pressure is elevated 143/78.  Patient is on hydrochlorothiazide 25 mg daily.  She is skipping lunch eating [employer] drinks for breakfast and baked chicken and salads for dinner.  She now has insurance with [place].  She missed her orthopedic referral because of her ankle pain because she had a high co-pay.  She smokes 1 cigarette daily still.  She has restless legs at night and does sleeps very fitfully and is very tired fatigue during the day.  She is due up a Pap smear.  She also needs a shingles vaccine she agrees to receive the first 1 at this visit. /27  09/27/21 Patient seen in return follow-up and has been compliant with her blood pressure medication on arrival blood pressure is 136/82.  Patient states her left leg and lower back are improved but she has a follow-up MRI with orthopedics of the lower back.  Sleep study is yet to be performed.  She needs a Pap smear.  She is down to 1 cigarette daily.  She has no other complaints at this visit.  9/27 Patient seen in return follow-up she is down to 2 cigarettes a day she needs a Pap smear she complains of a rash on her left forearm.  She was in the ER recently for cough was given a course of antibiotics this is resolved.  P  tient is going to orthopedics today to have her spine injected for chronic pain  There are no other complaints.  On arrival blood pressure is good 128/78.  09/18/22 Patient seen in return follow-up, patient has significant lumbar pain and left-sided and right-sided knee pain.  Patient was seen by orthopedics who offered injections of the left knee she deferred she is waiting  to hear about injections on her lumbar spine they have yet to call.  Patient is taking potassium because of an emergency room visit she is questioning whether she needs this or not blood pressure on arrival is good 119/76 she is on the chlorthalidone alone.  She needs follow-up labs.  Also needs a colonoscopy.  She had a Pap smear in November of last year was normal  08/15/23 This patient seen in return follow-up since the last visit she has developed a torn rotator cuff in the right shoulder and has chronic pain in the right knee.  This is an annual exam.  She was last seen by Dr. Alvis Lemmings in July of this past year.  At that time she was seen for blood pressure and abdominal pain and hypokalemia.  She has not had her potassium rechecked and does maintain chlorthalidone She has a colonoscopy scheduled for the ninth of next month and shoulder surgery on the right the 22nd of next month  Her abdominal pain is improved however she remains constipated with bowel movements only every other day and she does not eat many vegetables in her diet blood pressure on arrival is good 121/79 Sees ortho for shoulder knee   Past Medical History:  Diagnosis Date   Arthritis    back/R shoulder   GERD (gastroesophageal reflux disease)    Hypertension  on meds    Past Surgical History:  Procedure Laterality Date   ABDOMINAL HYSTERECTOMY  2005   ANKLE FRACTURE SURGERY Left 2011   CESAREAN SECTION     2 previous   KNEE ARTHROSCOPY Left 2008   for cartilage repair   LEG SURGERY  2011   TUBAL LIGATION      Family History  Problem Relation Age of Onset   Stomach cancer Mother 51   Diabetes Mother    Hypertension Mother    Stomach cancer Brother    Hodgkin's lymphoma Brother 22   Colon polyps Neg Hx    Colon cancer Neg Hx    Esophageal cancer Neg Hx    Rectal cancer Neg Hx     Social History   Socioeconomic History   Marital status: Married    Spouse name: Not on file   Number of children: Not  on file   Years of education: Not on file   Highest education level: Not on file  Occupational History   Occupation: unemployed    Comment: without health insurance  Tobacco Use   Smoking status: Some Days    Current packs/day: 0.10    Types: Cigarettes   Smokeless tobacco: Never   Tobacco comments:    Smokes 2 cigarettes/day - cutting back  Vaping Use   Vaping status: Never Used  Substance and Sexual Activity   Alcohol use: Yes    Alcohol/week: 3.0 standard drinks of alcohol    Types: 3 Standard drinks or equivalent per week    Comment: occassionally   Drug use: No   Sexual activity: Yes    Birth control/protection: Surgical  Other Topics Concern   Not on file  Social History Narrative   Lives in [place].   Had 2 children( two boys 23 and 29).   Living with fiancee.   Social Drivers of Corporate investment banker Strain: Low Risk  (08/15/2023)   Overall Financial Resource Strain (CARDIA)    Difficulty of Paying Living Expenses: Not very hard  Food Insecurity: No Food Insecurity (08/15/2023)   Hunger Vital Sign    Worried About Running Out of Food in the Last Year: Never true    Ran Out of Food in the Last Year: Never true  Transportation Needs: No Transportation Needs (08/15/2023)   PRAPARE - Administrator, Civil Service (Medical): No    Lack of Transportation (Non-Medical): No  Physical Activity: Insufficiently Active (08/15/2023)   Exercise Vital Sign    Days of Exercise per Week: 1 day    Minutes of Exercise per Session: 30 min  Stress: Stress Concern Present (08/15/2023)   [employer] of Occupational Health - Occupational Stress Questionnaire    Feeling of Stress : Rather much  Social Connections: Moderately Integrated (08/15/2023)   Social Connection and Isolation Panel [NHANES]    Frequency of Communication with Friends and Family: More than three times a week    Frequency of Social Gatherings with Friends and Family: Three times a week     Attends Religious Services: More than 4 times per year    Active Member of Clubs or Organizations: Yes    Attends Banker Meetings: 1 to 4 times per year    Marital Status: Separated  Intimate Partner Violence: Not At Risk (08/15/2023)   Humiliation, Afraid, Rape, and Kick questionnaire    Fear of Current or Ex-Partner: No    Emotionally Abused: No    Physically Abused: No  Sexually Abused: No    Outpatient Medications Prior to Visit  Medication Sig Dispense Refill   Acetaminophen (TYLENOL ARTHRITIS PAIN PO) Take 1 tablet by mouth daily.     clobetasol cream (TEMOVATE) 0.05 % Apply 1 Application topically 2 (two) times daily. To affected area on left forearm 60 g 1   diclofenac Sodium (VOLTAREN ARTHRITIS PAIN) 1 % GEL Apply 2 g topically 4 (four) times daily as needed (shoulder pain).     potassium chloride (KLOR-CON) 8 MEQ tablet Take 8 mEq by mouth daily.     traMADol (ULTRAM) 50 MG tablet Take 50 mg by mouth every 6 (six) hours as needed.     chlorthalidone (HYGROTON) 25 MG tablet Take 1 tablet (25 mg total) by mouth daily. 90 tablet 4   fluticasone (FLONASE) 50 MCG/ACT nasal spray Place 1 spray into both nostrils daily. (Patient not taking: Reported on 08/15/2023) 16 g 0   pantoprazole (PROTONIX) 20 MG tablet Take 1 tablet (20 mg total) by mouth daily. (Patient not taking: Reported on 06/24/2023) 30 tablet 0   sucralfate (CARAFATE) 1 g tablet Take 1 tablet (1 g total) by mouth 4 (four) times daily. (Patient not taking: Reported on 06/24/2023) 30 tablet 0   No facility-administered medications prior to visit.    Allergies  Allergen Reactions   Latex     On bandages    ROS Review of Systems  Constitutional: Negative.   HENT: Negative.  Negative for ear pain, postnasal drip, rhinorrhea, sinus pressure, sore throat, trouble swallowing and voice change.   Eyes: Negative.   Respiratory: Negative.  Negative for apnea, cough, choking, chest tightness, shortness of breath,  wheezing and stridor.   Cardiovascular: Negative.  Negative for chest pain, palpitations and leg swelling.  Gastrointestinal:  Positive for constipation. Negative for abdominal distention, abdominal pain, nausea and vomiting.  Genitourinary: Negative.   Musculoskeletal:  Positive for back pain. Negative for arthralgias and myalgias.       Left knee pain  Skin: Negative.  Negative for rash.  Allergic/Immunologic: Negative.  Negative for environmental allergies and food allergies.  Neurological: Negative.  Negative for dizziness, syncope, weakness and headaches.  Hematological: Negative.  Negative for adenopathy. Does not bruise/bleed easily.  Psychiatric/Behavioral: Negative.  Negative for agitation and sleep disturbance. The patient is not nervous/anxious.       Objective:    Physical Exam Vitals reviewed.  Constitutional:      Appearance: Normal appearance. She is well-developed. She is obese. She is not diaphoretic.  HENT:     Head: Normocephalic and atraumatic.     Nose: No nasal deformity, septal deviation, mucosal edema or rhinorrhea.     Right Sinus: No maxillary sinus tenderness or frontal sinus tenderness.     Left Sinus: No maxillary sinus tenderness or frontal sinus tenderness.     Mouth/Throat:     Pharynx: No oropharyngeal exudate.  Eyes:     General: No scleral icterus.    Conjunctiva/sclera: Conjunctivae normal.     Pupils: Pupils are equal, round, and reactive to light.  Neck:     Thyroid: No thyromegaly.     Vascular: No carotid bruit or JVD.     Trachea: Trachea normal. No tracheal tenderness or tracheal deviation.  Cardiovascular:     Rate and Rhythm: Normal rate and regular rhythm.     Chest Wall: PMI is not displaced.     Pulses: Normal pulses. No decreased pulses.     Heart sounds: Normal heart sounds,  S1 normal and S2 normal. Heart sounds not distant. No murmur heard.    No systolic murmur is present.     No diastolic murmur is present.     No friction  rub. No gallop. No S3 or S4 sounds.  Pulmonary:     Effort: No tachypnea, accessory muscle usage or respiratory distress.     Breath sounds: No stridor. No decreased breath sounds, wheezing, rhonchi or rales.  Chest:     Chest wall: No tenderness.  Abdominal:     General: Bowel sounds are normal. There is no distension.     Palpations: Abdomen is soft. Abdomen is not rigid.     Tenderness: There is no abdominal tenderness. There is no guarding or rebound.  Musculoskeletal:        General: Tenderness present. Normal range of motion.     Cervical back: Normal range of motion and neck supple. No edema, erythema or rigidity. No muscular tenderness. Normal range of motion.     Comments: Lumbar spine tenderness and left-sided knee pain  Lymphadenopathy:     Head:     Right side of head: No submental or submandibular adenopathy.     Left side of head: No submental or submandibular adenopathy.     Cervical: No cervical adenopathy.  Skin:    General: Skin is warm and dry.     Coloration: Skin is not pale.     Findings: No rash.     Nails: There is no clubbing.  Neurological:     Mental Status: She is alert and oriented to person, place, and time.     Sensory: No sensory deficit.  Psychiatric:        Speech: Speech normal.        Behavior: Behavior normal.     BP 121/79 (BP Location: Left Arm, Patient Position: Sitting)   Pulse (!) 59   Resp 19   Ht 4\' 11"  (1.499 m)   Wt 217 lb 12.8 oz (98.8 kg)   SpO2 97%   BMI 43.99 kg/m  Wt Readings from Last 3 Encounters:  08/15/23 217 lb 12.8 oz (98.8 kg)  06/24/23 216 lb (98 kg)  05/05/23 219 lb (99.3 kg)     Health Maintenance Due  Topic Date Due   INFLUENZA VACCINE  01/03/2023   COVID-19 Vaccine (4 - 2024-25 season) 02/03/2023    There are no preventive care reminders to display for this patient.  Lab Results  Component Value Date   TSH 0.42 08/25/2018   Lab Results  Component Value Date   WBC 6.1 05/05/2023   HGB 13.3  05/05/2023   HCT 39.4 05/05/2023   MCV 85.3 05/05/2023   PLT 249 05/05/2023   Lab Results  Component Value Date   NA 133 (L) 05/05/2023   K 2.7 (LL) 05/05/2023   CO2 27 05/05/2023   GLUCOSE 105 (H) 05/05/2023   BUN 12 05/05/2023   CREATININE 0.73 05/05/2023   BILITOT 0.6 12/27/2022   ALKPHOS 49 12/27/2022   AST 18 12/27/2022   ALT 12 12/27/2022   PROT 7.4 12/27/2022   ALBUMIN 3.4 (L) 12/27/2022   CALCIUM 9.0 05/05/2023   ANIONGAP 7 05/05/2023   EGFR 80 06/13/2022   Lab Results  Component Value Date   CHOL  04/10/2009    152        ATP III CLASSIFICATION:  <200     mg/dL   Desirable  604-540  mg/dL   Borderline High  >=981  mg/dL   High          Lab Results  Component Value Date   HDL 53 04/10/2009   Lab Results  Component Value Date   LDLCALC  04/10/2009    81        Total Cholesterol/HDL:CHD Risk Coronary Heart Disease Risk Table                     Men   Women  1/2 Average Risk   3.4   3.3  Average Risk       5.0   4.4  2 X Average Risk   9.6   7.1  3 X Average Risk  23.4   11.0        Use the calculated Patient Ratio above and the CHD Risk Table to determine the patient's CHD Risk.        ATP III CLASSIFICATION (LDL):  <100     mg/dL   Optimal  213-086  mg/dL   Near or Above                    Optimal  130-159  mg/dL   Borderline  578-469  mg/dL   High  >629     mg/dL   Very High   Lab Results  Component Value Date   TRIG 90 04/10/2009   Lab Results  Component Value Date   CHOLHDL 2.9 04/10/2009   Lab Results  Component Value Date   HGBA1C  04/11/2009    5.2 (NOTE) The ADA recommends the following therapeutic goal for glycemic control related to Hgb A1c measurement: Goal of therapy: <6.5 Hgb A1c  Reference: American Diabetes Association: Clinical Practice Recommendations 2010, Diabetes Care, 2010, 33: (Suppl  1).      Assessment & Plan:   Problem List Items Addressed This Visit       Cardiovascular and Mediastinum   Essential  hypertension - Primary   Hypertension well-controlled continue chlorthalidone but check potassium she may need potassium supplement or moved to a different blood pressure medication      Relevant Medications   chlorthalidone (HYGROTON) 25 MG tablet     Digestive   Fatty liver   The following Lifestyle Medicine recommendations according to American College of Lifestyle Medicine [facility]) were discussed and offered to patient who agrees to start the journey:  A. Whole Foods, Plant-based plate comprising of fruits and vegetables, plant-based proteins, whole-grain carbohydrates was discussed in detail with the patient.   A list for source of those nutrients were also provided to the patient.  Patient will use only water or unsweetened tea for hydration. B.  The need to stay away from risky substances including alcohol, smoking; obtaining 7 to 9 hours of restorative sleep, at least 150 minutes of moderate intensity exercise weekly, the importance of healthy social connections,  and stress reduction techniques were discussed. C.  A full color page of  Calorie density of various food groups per pound showing examples of each food groups was provided to the patient.       Obstipation   Has upcoming colonoscopy given recommendations for increase fiber in diet        Endocrine   Multiple thyroid nodules   Reassess with thyroid ultrasound      Relevant Orders   Thyroid Panel With TSH   US THYROID     Musculoskeletal and Integument   Rotator cuff tear arthropathy of right shoulder   Care  per orthopedics        Other   Hypokalemia   On chlorthalidone may have to switch to a different antihypertensive reassess potassium      Other Visit Diagnoses       Encounter for health-related screening       Relevant Orders   Lipid panel       Meds ordered this encounter  Medications   chlorthalidone (HYGROTON) 25 MG tablet    Sig: Take 1 tablet (25 mg total) by mouth daily.    Dispense:   90 tablet    Refill:  4    Follow-up: Return in about 6 months (around 02/15/2024) for primary care follow up, htn.    Shan Levans, MD

## 2023-08-15 ENCOUNTER — Ambulatory Visit: Payer: Medicaid Other | Attending: Critical Care Medicine | Admitting: Critical Care Medicine

## 2023-08-15 ENCOUNTER — Encounter: Payer: Self-pay | Admitting: Critical Care Medicine

## 2023-08-15 VITALS — BP 121/79 | HR 59 | Resp 19 | Ht 59.0 in | Wt 217.8 lb

## 2023-08-15 DIAGNOSIS — M75101 Unspecified rotator cuff tear or rupture of right shoulder, not specified as traumatic: Secondary | ICD-10-CM | POA: Insufficient documentation

## 2023-08-15 DIAGNOSIS — I10 Essential (primary) hypertension: Secondary | ICD-10-CM

## 2023-08-15 DIAGNOSIS — K5900 Constipation, unspecified: Secondary | ICD-10-CM | POA: Insufficient documentation

## 2023-08-15 DIAGNOSIS — E042 Nontoxic multinodular goiter: Secondary | ICD-10-CM

## 2023-08-15 DIAGNOSIS — K760 Fatty (change of) liver, not elsewhere classified: Secondary | ICD-10-CM | POA: Diagnosis not present

## 2023-08-15 DIAGNOSIS — Z139 Encounter for screening, unspecified: Secondary | ICD-10-CM | POA: Diagnosis not present

## 2023-08-15 DIAGNOSIS — M12811 Other specific arthropathies, not elsewhere classified, right shoulder: Secondary | ICD-10-CM | POA: Insufficient documentation

## 2023-08-15 DIAGNOSIS — E876 Hypokalemia: Secondary | ICD-10-CM

## 2023-08-15 MED ORDER — CHLORTHALIDONE 25 MG PO TABS: 25.0000 mg | ORAL_TABLET | Freq: Every day | ORAL | 4 refills | Status: DC

## 2023-08-15 NOTE — Assessment & Plan Note (Signed)
 Care per orthopedics

## 2023-08-15 NOTE — Assessment & Plan Note (Signed)
The following Lifestyle Medicine recommendations according to American College of Lifestyle Medicine (ACLM) were discussed and offered to patient who agrees to start the journey:  A. Whole Foods, Plant-based plate comprising of fruits and vegetables, plant-based proteins, whole-grain carbohydrates was discussed in detail with the patient.   A list for source of those nutrients were also provided to the patient.  Patient will use only water or unsweetened tea for hydration. B.  The need to stay away from risky substances including alcohol, smoking; obtaining 7 to 9 hours of restorative sleep, at least 150 minutes of moderate intensity exercise weekly, the importance of healthy social connections,  and stress reduction techniques were discussed. C.  A full color page of  Calorie density of various food groups per pound showing examples of each food groups was provided to the patient.  

## 2023-08-15 NOTE — Patient Instructions (Signed)
 See attachment for knee exercises  Stay on chlorthalidone for now we will check your labs and may yet need a change in medication or potassium supplement regularly  Thyroid ultrasound to be obtained along with thyroid function  Keep your appointments for your arthroscopy of shoulder and colonoscopy

## 2023-08-15 NOTE — Assessment & Plan Note (Signed)
 Reassess with thyroid ultrasound

## 2023-08-15 NOTE — Assessment & Plan Note (Signed)
 Hypertension well-controlled continue chlorthalidone but check potassium she may need potassium supplement or moved to a different blood pressure medication

## 2023-08-15 NOTE — Assessment & Plan Note (Signed)
 Has upcoming colonoscopy given recommendations for increase fiber in diet

## 2023-08-16 DIAGNOSIS — E876 Hypokalemia: Secondary | ICD-10-CM | POA: Insufficient documentation

## 2023-08-16 LAB — THYROID PANEL WITH TSH
Free Thyroxine Index: 2.8 (ref 1.2–4.9)
T3 Uptake Ratio: 25 % (ref 24–39)
T4, Total: 11.3 ug/dL (ref 4.5–12.0)
TSH: 0.413 u[IU]/mL — ABNORMAL LOW (ref 0.450–4.500)

## 2023-08-16 LAB — LIPID PANEL
Chol/HDL Ratio: 4.7 ratio — ABNORMAL HIGH (ref 0.0–4.4)
Cholesterol, Total: 261 mg/dL — ABNORMAL HIGH (ref 100–199)
HDL: 55 mg/dL (ref >39)
LDL Chol Calc (NIH): 187 mg/dL — ABNORMAL HIGH (ref 0–99)
Triglycerides: 108 mg/dL (ref 0–149)
VLDL Cholesterol Cal: 19 mg/dL (ref 5–40)

## 2023-08-16 NOTE — Assessment & Plan Note (Signed)
 On chlorthalidone may have to switch to a different antihypertensive reassess potassium

## 2023-08-17 ENCOUNTER — Other Ambulatory Visit: Payer: Self-pay | Admitting: Critical Care Medicine

## 2023-08-17 ENCOUNTER — Encounter: Payer: Self-pay | Admitting: Critical Care Medicine

## 2023-08-17 DIAGNOSIS — E876 Hypokalemia: Secondary | ICD-10-CM

## 2023-08-17 MED ORDER — ATORVASTATIN CALCIUM 20 MG PO TABS: 20.0000 mg | ORAL_TABLET | Freq: Every day | ORAL | 3 refills | Status: DC

## 2023-08-17 NOTE — Progress Notes (Signed)
 Let patient know thyroid function normal cholesterol is extremely high, I am going to start and prescribe a cholesterol pill to take 1 daily sent to pharmacy  For some reason the kidney and potassium levels were not processed please apologize to the patient for this as she will need to come in for another blood draw

## 2023-08-21 ENCOUNTER — Ambulatory Visit
Admission: RE | Admit: 2023-08-21 | Discharge: 2023-08-21 | Disposition: A | Discharge status: Home / Self Care | Source: Ambulatory Visit | Attending: Critical Care Medicine | Admitting: Critical Care Medicine

## 2023-08-21 DIAGNOSIS — E042 Nontoxic multinodular goiter: Secondary | ICD-10-CM

## 2023-08-22 ENCOUNTER — Ambulatory Visit: Payer: Medicaid Other

## 2023-08-22 NOTE — Progress Notes (Unsigned)
 Patient called back to reschedule pre-visit for upcoming procedure.

## 2023-08-23 ENCOUNTER — Ambulatory Visit: Attending: Critical Care Medicine

## 2023-08-23 DIAGNOSIS — K760 Fatty (change of) liver, not elsewhere classified: Secondary | ICD-10-CM

## 2023-08-23 DIAGNOSIS — E876 Hypokalemia: Secondary | ICD-10-CM

## 2023-08-23 DIAGNOSIS — I10 Essential (primary) hypertension: Secondary | ICD-10-CM

## 2023-08-24 LAB — CBC WITH DIFFERENTIAL/PLATELET
Basophils Absolute: 0 10*3/uL (ref 0.0–0.2)
Basos: 0 %
EOS (ABSOLUTE): 0.1 10*3/uL (ref 0.0–0.4)
Eos: 1 %
Hematocrit: 39.5 % (ref 34.0–46.6)
Hemoglobin: 13.4 g/dL (ref 11.1–15.9)
Immature Grans (Abs): 0 10*3/uL (ref 0.0–0.1)
Immature Granulocytes: 0 %
Lymphocytes Absolute: 3.4 10*3/uL — ABNORMAL HIGH (ref 0.7–3.1)
Lymphs: 55 %
MCH: 28.6 pg (ref 26.6–33.0)
MCHC: 33.9 g/dL (ref 31.5–35.7)
MCV: 84 fL (ref 79–97)
Monocytes Absolute: 0.5 10*3/uL (ref 0.1–0.9)
Monocytes: 7 %
Neutrophils Absolute: 2.3 10*3/uL (ref 1.4–7.0)
Neutrophils: 37 %
Platelets: 239 10*3/uL (ref 150–450)
RBC: 4.68 x10E6/uL (ref 3.77–5.28)
RDW: 13.4 % (ref 11.7–15.4)
WBC: 6.3 10*3/uL (ref 3.4–10.8)

## 2023-08-24 LAB — BMP8+EGFR
BUN/Creatinine Ratio: 15 (ref 12–28)
BUN: 13 mg/dL (ref 8–27)
CO2: 27 mmol/L (ref 20–29)
Calcium: 9.8 mg/dL (ref 8.7–10.3)
Chloride: 102 mmol/L (ref 96–106)
Creatinine, Ser: 0.85 mg/dL (ref 0.57–1.00)
Glucose: 86 mg/dL (ref 70–99)
Potassium: 3.6 mmol/L (ref 3.5–5.2)
Sodium: 142 mmol/L (ref 134–144)
eGFR: 77 mL/min/{1.73_m2} (ref >59)

## 2023-08-24 LAB — COMPREHENSIVE METABOLIC PANEL
ALT: 8 IU/L (ref 0–32)
AST: 15 IU/L (ref 0–40)
Albumin: 4.1 g/dL (ref 3.9–4.9)
Alkaline Phosphatase: 61 IU/L (ref 44–121)
Bilirubin Total: 0.4 mg/dL (ref 0.0–1.2)
Globulin, Total: 2.6 g/dL (ref 1.5–4.5)
Total Protein: 6.7 g/dL (ref 6.0–8.5)

## 2023-08-26 ENCOUNTER — Encounter: Payer: Self-pay | Admitting: Physician Assistant

## 2023-08-26 ENCOUNTER — Ambulatory Visit (AMBULATORY_SURGERY_CENTER): Admitting: *Deleted

## 2023-08-26 VITALS — Ht 59.0 in | Wt 215.0 lb

## 2023-08-26 DIAGNOSIS — Z1211 Encounter for screening for malignant neoplasm of colon: Secondary | ICD-10-CM

## 2023-08-26 NOTE — Progress Notes (Signed)
 Pt's name and DOB verified at the beginning of the pre-visit wit 2 identifiers  Pt denies any difficulty with ambulating,sitting, laying down or rolling side to side  Pt has no issues with ambulation   Pt has no issues moving head neck or swallowing  No egg or soy allergy known to patient   No issues known to pt with past sedation with any surgeries or procedures  Pt denies having issues being intubated  No FH of Malignant Hyperthermia  Pt is not on diet pills or shots  Pt is not on home 02   Pt is not on blood thinners    Pt has frequent issues with constipation RN instructed pt to use Miralax per bottles instructions a week before prep days. Pt states they will  Pt is not on dialysis  Pt denise any abnormal heart rhythms   Pt denies any upcoming cardiac testing  Patient's chart reviewed by Cathlyn Parsons CNRA prior to pre-visit and patient appropriate for the [facility].  Pre-visit completed and red dot placed by patient's name on their procedure day (on provider's schedule).     Visit by phone  Pt states weight is 215 lb  IInstructions reviewed. Pt given [facility] main # and MD on call # prior to instructions.  Pt states understanding of instructions. Instructed to review again prior to procedure. Pt states they will.   Pt has prep at home

## 2023-08-28 ENCOUNTER — Telehealth: Payer: Self-pay

## 2023-08-28 NOTE — Telephone Encounter (Signed)
 Copied from CRM [id number]. Topic: Clinical - Lab/Test Results >> Aug 26, 2023  1:42 PM Fredrich Romans wrote: Reason for CRM: Patient returned call to office regarding lab results.I relayed lab message from CMA.Patient verbalized understanding

## 2023-09-09 NOTE — Progress Notes (Signed)
 Surgical Instructions   Your procedure is scheduled on Tuesday, April 22, 25.. Report to [facility] Main Entrance "A" at 5:30 A.M., then check in with the Admitting office. Any questions or running late day of surgery: call [phone]  Questions prior to your surgery date: call [phone], Monday-Friday, 8am-4pm. If you experience any cold or flu symptoms such as cough, fever, chills, shortness of breath, etc. between now and your scheduled surgery, please notify us at the above number.     Remember:  Do not eat after midnight the night before your surgery   You may drink clear liquids until 4:30 the morning of your surgery.   Clear liquids allowed are: Water, Non-Citrus Juices (without pulp), Carbonated Beverages, Clear Tea (no milk, honey, etc.), Black Coffee Only (NO MILK, CREAM OR POWDERED CREAMER of any kind), and Gatorade.    Take these medicines the morning of surgery with A SIP OF WATER  acetaminophen (TYLENOL)  atorvastatin (LIPITOR)    May take these medicines IF NEEDED: albuterol (VENTOLIN HFA) inhaler  traMADol (ULTRAM)   One week prior to surgery, STOP taking any Aspirin (unless otherwise instructed by your surgeon) Aleve, Naproxen, Ibuprofen, Motrin, Advil, Goody's, BC's, all herbal medications, fish oil, and non-prescription vitamins. This includes your diclofenac Sodium (VOLTAREN) gel.  Oral Hygiene is also important to reduce your risk of infection.  Remember - BRUSH YOUR TEETH THE MORNING OF SURGERY WITH YOUR REGULAR TOOTHPASTE  [facility]- Preparing for Total Shoulder Arthroplasty  Before surgery, you can play an important role. Because skin is not sterile, your skin needs to be as free of germs as possible. You can reduce the number of germs on your skin by using the following products.   Benzoyl Peroxide Gel  o Reduces the number of germs present on the skin  o Applied twice a day to shoulder area starting two days before surgery   Chlorhexidine  Gluconate (CHG) Soap (instructions listed above on how to wash with CHG Soap)  o An antiseptic cleaner that kills germs and bonds with the skin to continue killing germs even after washing  o Used for showering the night before surgery and morning of surgery   ==================================================================  Please follow these instructions carefully:  BENZOYL PEROXIDE 5% GEL  Please do not use if you have an allergy to benzoyl peroxide. If your skin becomes reddened/irritated stop using the benzoyl peroxide.  Starting two days before surgery, apply as follows:  1. Apply benzoyl peroxide in the morning and at night. Apply after taking a shower. If you are not taking a shower clean entire shoulder front, back, and side along with the armpit with a clean wet washcloth.  2. Place a quarter-sized dollop on your SHOULDER and rub in thoroughly, making sure to cover the front, back, and side of your shoulder, along with the armpit.   2 Days prior to Surgery First Dose on _____________ Morning Second Dose on ______________ Night  Day Before Surgery First Dose on ______________ Morning Night before surgery wash (entire body except face and private areas) with CHG Soap THEN Second Dose on ____________ Night   Morning of Surgery  wash BODY AGAIN with CHG Soap   4. Do NOT apply benzoyl peroxide gel on the day of surgery   [facility]- Preparing For Surgery  Before surgery, you can play an important role. Because skin is not sterile, your skin needs to be as free of germs as possible. You can reduce the number of germs on  your skin by washing with CHG (chlorahexidine gluconate) Soap before surgery.  CHG is an antiseptic cleaner which kills germs and bonds with the skin to continue killing germs even after washing.     Please do not use if you have an allergy to CHG or antibacterial soaps. If your skin becomes reddened/irritated stop using the CHG.  Do not shave  (including legs and underarms) for at least 48 hours prior to first CHG shower. It is OK to shave your face.  Please follow these instructions carefully.     Shower the NIGHT BEFORE SURGERY and the MORNING OF SURGERY with CHG Soap.   If you chose to wash your hair, wash your hair first as usual with your normal shampoo. After you shampoo, rinse your hair and body thoroughly to remove the shampoo.  Then [employer] and genitals (private parts) with your normal soap and rinse thoroughly to remove soap.  After that Use CHG Soap as you would any other liquid soap. You can apply CHG directly to the skin and wash gently with a scrungie or a clean washcloth.   Apply the CHG Soap to your body ONLY FROM THE NECK DOWN.  Do not use on open wounds or open sores. Avoid contact with your eyes, ears, mouth and genitals (private parts). Wash Face and genitals (private parts)  with your normal soap.   Wash thoroughly, paying special attention to the area where your surgery will be performed.  Thoroughly rinse your body with warm water from the neck down.  DO NOT shower/wash with your normal soap after using and rinsing off the CHG Soap.  Pat yourself dry with a CLEAN TOWEL.  8. Apply the Benzoyl Peroxide only the night before surgery.  Do Not use it the morning of surgery.  Wear CLEAN PAJAMAS to bed the night before surgery  Place CLEAN SHEETS on your bed the night before your surgery  DO NOT SLEEP WITH PETS.   Day of Surgery: Take a shower with CHG soap. Wear Clean/Comfortable clothing the morning of surgery Do not apply any deodorants/lotions.   Remember to brush your teeth WITH YOUR REGULAR TOOTHPASTE.   Please read over the following fact sheets that you were given.                      Do NOT Smoke (Tobacco/Vaping) for 24 hours prior to your procedure.  If you use a CPAP at night, you may bring your mask/headgear for your overnight stay.   You will be asked to remove any contacts,  glasses, piercing's, hearing aid's, dentures/partials prior to surgery. Please bring cases for these items if needed.    Patients discharged the day of surgery will not be allowed to drive home, and someone needs to stay with them for 24 hours.  SURGICAL WAITING ROOM VISITATION Patients may have no more than 2 support people in the waiting area - these visitors may rotate.   Pre-op nurse will coordinate an appropriate time for 1 ADULT support person, who may not rotate, to accompany patient in pre-op.  Children under the age of 35 must have an adult with them who is not the patient and must remain in the main waiting area with an adult.  If the patient needs to stay at the hospital during part of their recovery, the visitor guidelines for inpatient rooms apply.  Please refer to the [facility] website for the visitor guidelines for any additional information.   If  you received a COVID test during your pre-op visit  it is requested that you wear a mask when out in public, stay away from anyone that may not be feeling well and notify your surgeon if you develop symptoms. If you have been in contact with anyone that has tested positive in the last 10 days please notify you surgeon.      Pre-operative CHG Bathing Instructions   You can play a key role in reducing the risk of infection after surgery. Your skin needs to be as free of germs as possible. You can reduce the number of germs on your skin by washing with CHG (chlorhexidine gluconate) soap before surgery. CHG is an antiseptic soap that kills germs and continues to kill germs even after washing.   DO NOT use if you have an allergy to chlorhexidine/CHG or antibacterial soaps. If your skin becomes reddened or irritated, stop using the CHG and notify one of our RNs at [phone].              TAKE A SHOWER THE NIGHT BEFORE SURGERY AND THE DAY OF SURGERY    Please keep in mind the following:  DO NOT shave, including legs and underarms,  48 hours prior to surgery.   You may shave your face before/day of surgery.  Place clean sheets on your bed the night before surgery Use a clean washcloth (not used since being washed) for each shower. DO NOT sleep with pet's night before surgery.  CHG Shower Instructions:  Wash your face and private area with normal soap. If you choose to wash your hair, wash first with your normal shampoo.  After you use shampoo/soap, rinse your hair and body thoroughly to remove shampoo/soap residue.  Turn the water OFF and apply half the bottle of CHG soap to a CLEAN washcloth.  Apply CHG soap ONLY FROM YOUR NECK DOWN TO YOUR TOES (washing for 3-5 minutes)  DO NOT use CHG soap on face, private areas, open wounds, or sores.  Pay special attention to the area where your surgery is being performed.  If you are having back surgery, having someone wash your back for you may be helpful. Wait 2 minutes after CHG soap is applied, then you may rinse off the CHG soap.  Pat dry with a clean towel  Put on clean pajamas    Additional instructions for the day of surgery: DO NOT APPLY any lotions, deodorants, cologne, or perfumes.   Do not wear jewelry or makeup Do not wear nail polish, gel polish, artificial nails, or any other type of covering on natural nails (fingers and toes) Do not bring valuables to the hospital. [facility] is not responsible for valuables/personal belongings. Put on clean/comfortable clothes.  Please brush your teeth.  Ask your nurse before applying any prescription medications to the skin.

## 2023-09-09 NOTE — Progress Notes (Signed)
 Left message with Eunice Blase regarding surgical orders for the upcoming procedure on 09/24/23.

## 2023-09-10 ENCOUNTER — Encounter (HOSPITAL_COMMUNITY): Payer: Self-pay

## 2023-09-10 ENCOUNTER — Encounter (HOSPITAL_COMMUNITY)
Admission: RE | Admit: 2023-09-10 | Discharge: 2023-09-10 | Disposition: A | Discharge status: Home / Self Care | Source: Ambulatory Visit | Attending: Orthopedic Surgery | Admitting: Orthopedic Surgery

## 2023-09-10 ENCOUNTER — Other Ambulatory Visit: Payer: Self-pay

## 2023-09-10 VITALS — BP 131/71 | HR 70 | Temp 98.2°F | Resp 17 | Ht 59.0 in | Wt 218.0 lb

## 2023-09-10 DIAGNOSIS — Z01812 Encounter for preprocedural laboratory examination: Secondary | ICD-10-CM | POA: Insufficient documentation

## 2023-09-10 DIAGNOSIS — Z01818 Encounter for other preprocedural examination: Secondary | ICD-10-CM

## 2023-09-10 LAB — COMPREHENSIVE METABOLIC PANEL WITH GFR
ALT: 13 U/L (ref 0–44)
AST: 21 U/L (ref 15–41)
Albumin: 3.6 g/dL (ref 3.5–5.0)
Alkaline Phosphatase: 42 U/L (ref 38–126)
Anion gap: 11 (ref 5–15)
BUN: 12 mg/dL (ref 8–23)
CO2: 25 mmol/L (ref 22–32)
Calcium: 9.5 mg/dL (ref 8.9–10.3)
Chloride: 102 mmol/L (ref 98–111)
Creatinine, Ser: 0.96 mg/dL (ref 0.44–1.00)
GFR, Estimated: >60 mL/min (ref >60)
Glucose, Bld: 104 mg/dL — ABNORMAL HIGH (ref 70–99)
Potassium: 3 mmol/L — ABNORMAL LOW (ref 3.5–5.1)
Sodium: 138 mmol/L (ref 135–145)
Total Bilirubin: 0.8 mg/dL (ref 0.0–1.2)
Total Protein: 7.2 g/dL (ref 6.5–8.1)

## 2023-09-10 LAB — CBC
HCT: 39.6 % (ref 36.0–46.0)
Hemoglobin: 13.4 g/dL (ref 12.0–15.0)
MCH: 28.3 pg (ref 26.0–34.0)
MCHC: 33.8 g/dL (ref 30.0–36.0)
MCV: 83.5 fL (ref 80.0–100.0)
Platelets: 252 10*3/uL (ref 150–400)
RBC: 4.74 MIL/uL (ref 3.87–5.11)
RDW: 13.5 % (ref 11.5–15.5)
WBC: 7.1 10*3/uL (ref 4.0–10.5)
nRBC: 0 % (ref 0.0–0.2)

## 2023-09-10 NOTE — Progress Notes (Signed)
 Message to [facility] making him aware of Potassium level of 3

## 2023-09-10 NOTE — Progress Notes (Signed)
 PCP - Dr. Alvis Lemmings Cardiologist -   PPM/ICD - denies Device Orders -  Rep Notified -   Chest x-ray - 05-05-23 EKG - 05-10-23 Stress Test - 10-2417 ECHO - 04-11-09 Cardiac Cath -   Sleep Study -denies CPAP -   Dm -denies  Blood Thinner Instructions:denies Aspirin Instructions:  ERAS Protcol - clear liquids until 4:30  COVID TEST-    Anesthesia review: no  Patient denies shortness of breath, fever, cough and chest pain at PAT appointment   All instructions explained to the patient, with a verbal understanding of the material. Patient agrees to go over the instructions while at home for a better understanding. Patient also instructed to self quarantine after being tested for COVID-19. The opportunity to ask questions was provided.

## 2023-09-11 ENCOUNTER — Ambulatory Visit (AMBULATORY_SURGERY_CENTER): Payer: Medicaid Other | Admitting: Gastroenterology

## 2023-09-11 ENCOUNTER — Encounter: Payer: Self-pay | Admitting: Gastroenterology

## 2023-09-11 VITALS — BP 123/66 | HR 59 | Temp 97.9°F | Resp 11 | Ht 59.0 in | Wt 215.0 lb

## 2023-09-11 DIAGNOSIS — K5730 Diverticulosis of large intestine without perforation or abscess without bleeding: Secondary | ICD-10-CM

## 2023-09-11 DIAGNOSIS — Z1211 Encounter for screening for malignant neoplasm of colon: Secondary | ICD-10-CM

## 2023-09-11 DIAGNOSIS — D122 Benign neoplasm of ascending colon: Secondary | ICD-10-CM | POA: Diagnosis not present

## 2023-09-11 MED ORDER — SODIUM CHLORIDE 0.9 % IV SOLN: 500.0000 mL | INTRAVENOUS | Status: DC

## 2023-09-11 NOTE — Op Note (Signed)
 [facility] Patient Name: Sally Reid Procedure Date: 09/11/2023 8:42 AM MRN: [id number] Endoscopist: Lorin Picket E. Tomasa Rand , MD, [id number] Age: 63 Referring MD:  Date of Birth: 09/30/1960 Gender: Female Account #: [id number] Procedure:                Colonoscopy Indications:              Screening for colorectal malignant neoplasm, unable                            to locate last colonoscopy note (more recent than                            10 years ago) Medicines:                Monitored Anesthesia Care Procedure:                Pre-Anesthesia Assessment:                           - Prior to the procedure, a History and Physical                            was performed, and patient medications and                            allergies were reviewed. The patient's tolerance of                            previous anesthesia was also reviewed. The risks                            and benefits of the procedure and the sedation                            options and risks were discussed with the patient.                            All questions were answered, and informed consent                            was obtained. Prior Anticoagulants: The patient has                            taken no anticoagulant or antiplatelet agents. ASA                            Grade Assessment: II - A patient with mild systemic                            disease. After reviewing the risks and benefits,                            the patient was deemed in satisfactory condition to  undergo the procedure.                           After obtaining informed consent, the colonoscope                            was passed under direct vision. Throughout the                            procedure, the patient's blood pressure, pulse, and                            oxygen saturations were monitored continuously. The                            Olympus Scope SN: [id number] was  introduced through                            the anus and advanced to the the terminal ileum,                            with identification of the appendiceal orifice and                            IC valve. The colonoscopy was performed without                            difficulty. The patient tolerated the procedure                            well. The quality of the bowel preparation was                            good. The terminal ileum, ileocecal valve,                            appendiceal orifice, and rectum were photographed.                            The bowel preparation used was SUFLAVE via split                            dose instruction. Scope In: 9:08:04 AM Scope Out: 9:27:11 AM Scope Withdrawal Time: 0 hours 15 minutes 54 seconds  Total Procedure Duration: 0 hours 19 minutes 7 seconds  Findings:                 The perianal and digital rectal examinations were                            normal. Pertinent negatives include normal                            sphincter tone and no palpable rectal lesions.  A 17 mm polyp was found in the distal ascending                            colon. The polyp was pedunculated. The polyp was                            removed with a hot snare. Resection and retrieval                            were complete. Estimated blood loss: none.                           Two large-mouthed and small-mouthed diverticula                            were found in the ascending colon.                           The exam was otherwise normal throughout the                            examined colon.                           The terminal ileum appeared normal.                           The retroflexed view of the distal rectum and anal                            verge was normal and showed no anal or rectal                            abnormalities. Complications:            No immediate complications. Estimated Blood Loss:      Estimated blood loss: none. Impression:               - One 17 mm polyp in the distal ascending colon,                            removed with a hot snare. Resected and retrieved.                           - Mild diverticulosis in the ascending colon.                           - The examined portion of the ileum was normal.                           - The distal rectum and anal verge are normal on                            retroflexion view. Recommendation:           -  Patient has a contact number available for                            emergencies. The signs and symptoms of potential                            delayed complications were discussed with the                            patient. Return to normal activities tomorrow.                            Written discharge instructions were provided to the                            patient.                           - Resume previous diet.                           - Continue present medications.                           - Await pathology results.                           - Repeat colonoscopy (date not yet determined) for                            surveillance based on pathology results. Tanga Gloor E. Tomasa Rand, MD 09/11/2023 9:34:29 AM This report has been signed electronically.

## 2023-09-11 NOTE — Progress Notes (Signed)
 [facility] History and Physical   Primary Care Physician:  Hoy Register, MD   Reason for Procedure:   Colon cancer screening  Plan:    Screening colonoscopy     HPI: Sally Reid is a 63 y.o. female undergoing average risk screening colonoscopy.  She has no family history of colon cancer and no chronic GI symptoms other than occasional constipation and abdominal discomfort. She reports having a colonoscopy with [facility] maybe 10 years ago.  She thinks she may have had a polyp removed.   Past Medical History:  Diagnosis Date   Arthritis    back/R shoulder   Chronic hepatitis C (HCC)    Fatty liver    GERD (gastroesophageal reflux disease)    Hyperlipidemia    Hypertension    on meds    Past Surgical History:  Procedure Laterality Date   ABDOMINAL HYSTERECTOMY  2005   ANKLE FRACTURE SURGERY Left 2011   CESAREAN SECTION     2 previous   KNEE ARTHROSCOPY Left 2008   for cartilage repair   LEG SURGERY  2011   TUBAL LIGATION      Prior to Admission medications   Medication Sig Start Date End Date Taking? Authorizing Provider  acetaminophen (TYLENOL) 500 MG tablet Take 500 mg by mouth daily.    [provider]  albuterol (VENTOLIN HFA) 108 (90 Base) MCG/ACT inhaler Inhale 2 puffs into the lungs every 6 (six) hours as needed for wheezing or shortness of breath.    [provider]  APPLE CIDER VINEGAR PO Take 450 mg by mouth daily.    [provider]  atorvastatin (LIPITOR) 20 MG tablet Take 1 tablet (20 mg total) by mouth daily. 08/17/23   Storm Frisk, MD  chlorthalidone (HYGROTON) 25 MG tablet Take 1 tablet (25 mg total) by mouth daily. 08/15/23   Storm Frisk, MD  clobetasol cream (TEMOVATE) 0.05 % Apply 1 Application topically 2 (two) times daily. To affected area on left forearm Patient taking differently: Apply 1 Application topically 2 (two) times daily as needed (rash). To affected area on left forearm 09/18/22    Storm Frisk, MD  diclofenac Sodium (VOLTAREN ARTHRITIS PAIN) 1 % GEL Apply 2 g topically 4 (four) times daily as needed (shoulder pain).    [provider]  potassium chloride SA (KLOR-CON M) 20 MEQ tablet Take 20 mEq by mouth every other day.    [provider]  traMADol (ULTRAM) 50 MG tablet Take 50 mg by mouth every 6 (six) hours as needed for moderate pain (pain score 4-6).    [provider]    Current Outpatient Medications  Medication Sig Dispense Refill   acetaminophen (TYLENOL) 500 MG tablet Take 500 mg by mouth daily.     albuterol (VENTOLIN HFA) 108 (90 Base) MCG/ACT inhaler Inhale 2 puffs into the lungs every 6 (six) hours as needed for wheezing or shortness of breath.     APPLE CIDER VINEGAR PO Take 450 mg by mouth daily.     atorvastatin (LIPITOR) 20 MG tablet Take 1 tablet (20 mg total) by mouth daily. 90 tablet 3   chlorthalidone (HYGROTON) 25 MG tablet Take 1 tablet (25 mg total) by mouth daily. 90 tablet 4   clobetasol cream (TEMOVATE) 0.05 % Apply 1 Application topically 2 (two) times daily. To affected area on left forearm (Patient taking differently: Apply 1 Application topically 2 (two) times daily as needed (rash). To affected area on left forearm) 60  g 1   diclofenac Sodium (VOLTAREN ARTHRITIS PAIN) 1 % GEL Apply 2 g topically 4 (four) times daily as needed (shoulder pain).     potassium chloride SA (KLOR-CON M) 20 MEQ tablet Take 20 mEq by mouth every other day.     traMADol (ULTRAM) 50 MG tablet Take 50 mg by mouth every 6 (six) hours as needed for moderate pain (pain score 4-6).     Current Facility-Administered Medications  Medication Dose Route Frequency Provider Last Rate Last Admin   0.9 %  sodium chloride infusion  500 mL Intravenous Continuous Jenel Lucks, MD        Allergies as of 09/11/2023 - Review Complete 09/10/2023  Allergen Reaction Noted   Latex Rash 05/05/2023    Family History  Problem Relation Age of  Onset   Stomach cancer Mother 22   Diabetes Mother    Hypertension Mother    Stomach cancer Brother    Hodgkin's lymphoma Brother 40   Colon polyps Neg Hx    Colon cancer Neg Hx    Esophageal cancer Neg Hx    Rectal cancer Neg Hx     Social History   Socioeconomic History   Marital status: Married    Spouse name: Not on file   Number of children: Not on file   Years of education: Not on file   Highest education level: Not on file  Occupational History   Occupation: unemployed    Comment: without health insurance  Tobacco Use   Smoking status: Some Days    Current packs/day: 0.10    Types: Cigarettes   Smokeless tobacco: Never   Tobacco comments:    Smokes 2 cigarettes/day - cutting back  Vaping Use   Vaping status: Never Used  Substance and Sexual Activity   Alcohol use: Yes    Alcohol/week: 3.0 standard drinks of alcohol    Types: 3 Standard drinks or equivalent per week    Comment: occassionally   Drug use: No   Sexual activity: Yes    Birth control/protection: Surgical  Other Topics Concern   Not on file  Social History Narrative   Lives in [place].   Had 2 children( two boys 93 and 29).   Living with fiancee.   Social Drivers of Corporate investment banker Strain: Low Risk  (08/15/2023)   Overall Financial Resource Strain (CARDIA)    Difficulty of Paying Living Expenses: Not very hard  Food Insecurity: No Food Insecurity (08/15/2023)   Hunger Vital Sign    Worried About Running Out of Food in the Last Year: Never true    Ran Out of Food in the Last Year: Never true  Transportation Needs: No Transportation Needs (08/15/2023)   PRAPARE - Administrator, Civil Service (Medical): No    Lack of Transportation (Non-Medical): No  Physical Activity: Insufficiently Active (08/15/2023)   Exercise Vital Sign    Days of Exercise per Week: 1 day    Minutes of Exercise per Session: 30 min  Stress: Stress Concern Present (08/15/2023)   [employer]  of Occupational Health - Occupational Stress Questionnaire    Feeling of Stress : Rather much  Social Connections: Moderately Integrated (08/15/2023)   Social Connection and Isolation Panel [NHANES]    Frequency of Communication with Friends and Family: More than three times a week    Frequency of Social Gatherings with Friends and Family: Three times a week    Attends Religious Services: More than 4  times per year    Active Member of Clubs or Organizations: Yes    Attends Banker Meetings: 1 to 4 times per year    Marital Status: Separated  Intimate Partner Violence: Not At Risk (08/15/2023)   Humiliation, Afraid, Rape, and Kick questionnaire    Fear of Current or Ex-Partner: No    Emotionally Abused: No    Physically Abused: No    Sexually Abused: No    Review of Systems:  All other review of systems negative except as mentioned in the HPI.  Physical Exam: Vital signs BP (!) 170/78   Pulse (!) 52   Temp 97.9 F (36.6 C)   Resp 13   Ht 4\' 11"  (1.499 m)   Wt 215 lb (97.5 kg)   SpO2 100%   BMI 43.42 kg/m   General:   Alert,  Well-developed, well-nourished, pleasant and cooperative in NAD Airway:  Mallampati 1 Lungs:  Clear throughout to auscultation.   Heart:  Regular rate and rhythm; no murmurs, clicks, rubs,  or gallops. Abdomen:  Soft, nontender and nondistended. Normal bowel sounds.   Neuro/Psych:  Normal mood and affect. A and O x 3   Tamikia Chowning E. Tomasa Rand, MD [facility]

## 2023-09-11 NOTE — Patient Instructions (Signed)

## 2023-09-11 NOTE — Progress Notes (Signed)
 Called to room to assist during endoscopic procedure.  Patient ID and intended procedure confirmed with present staff. Received instructions for my participation in the procedure from the performing physician.

## 2023-09-11 NOTE — Progress Notes (Signed)
 A/o x 3, VSS, gd SR's, pleased with anesthesia, report to RN

## 2023-09-11 NOTE — Progress Notes (Signed)
 Pt's states no medical or surgical changes since previsit or office visit.

## 2023-09-12 ENCOUNTER — Telehealth: Payer: Self-pay | Admitting: *Deleted

## 2023-09-12 NOTE — Telephone Encounter (Signed)
  Follow up Call-     09/11/2023    8:41 AM  Call back number  Post procedure Call Back phone  # [phone]  Permission to leave phone message Yes     Patient questions:  Do you have a fever, pain , or abdominal swelling? No. Pain Score  0 *  Have you tolerated food without any problems? Yes.    Have you been able to return to your normal activities? Yes.    Do you have any questions about your discharge instructions: Diet   No. Medications  No. Follow up visit  No.  Do you have questions or concerns about your Care? No.  Actions: * If pain score is 4 or above: No action needed, pain <4.

## 2023-09-13 ENCOUNTER — Telehealth: Payer: Self-pay | Admitting: Surgical

## 2023-09-13 NOTE — Telephone Encounter (Signed)
 I called patient about her hypokalemia on recent preop labs.  She states that she has a potassium supplement that she is supposed to take every day but she only takes this every other day because the large pill is uncomfortable to take for her.  Recommended that she take her potassium supplement every day leading up to the day of surgery and we can recheck her potassium with BMP on the day of surgery to ensure no continued drop of her potassium.  3 weeks ago was 3.6 and 3 days ago it was 3.0.  Patient agreed with plan.

## 2023-09-16 LAB — SURGICAL PATHOLOGY

## 2023-09-18 ENCOUNTER — Telehealth: Payer: Self-pay

## 2023-09-18 DIAGNOSIS — M75111 Incomplete rotator cuff tear or rupture of right shoulder, not specified as traumatic: Secondary | ICD-10-CM

## 2023-09-18 NOTE — Telephone Encounter (Signed)
 Code 16109 for this procedure has been denied by [facility] stating criteria not met. Please advise on how you would like to proceed.   [id number] Repair Of Rotator Cuff Chronic Right   Non-Authorized           Criteria Not Met            Clinical Rationale: Your doctor told us  you have shoulder pain. Your doctor has asked to do surgery to treat your pain. This surgery is needed when the following are true. IMAGING: You need to have recent pictures of your shoulder, like special scans (MRI or CT). We need to see detailed results of these pictures. We need a report from a doctor who specializes in reading these pictures (radiologist). The pictures should show a full thickness tear of the rotator cuff. The radiology report does not show a full thickness tear in at least one of the tendons. TREATMENT: Your doctor should have treated you for at least six weeks, but it did not work. This treatment should include physical therapy and other treatments. Physical therapy should be done with a qualified therapist, or your doctor should give you an exercise program to do at home. Other treatments include medicines or shots into your joint. The notes do not show that you had these treatments. As a result, this surgery is not medically necessary. We used [employer] Benefits Management Clinical Guideline titled Joint Surgery, Shoulder Arthroscopy and Open Procedures to make this decision. You may view this guideline at www.carelon.com/mbm-guidelines-musculoskeletal.

## 2023-09-19 NOTE — Addendum Note (Signed)
 Addended by: Catherene Close on: 09/19/2023 01:57 PM   Modules accepted: Orders

## 2023-09-19 NOTE — Telephone Encounter (Signed)
 Her surgery needs to be canceled and she can do 6 weeks of physical therapy and then we can reschedule her after that.  I disagree with the statement your doctor should have treated you for at least 6 weeks.  She has had this pain for over a year and any cursory review of the chart would make it clear that we are not following a high school algorithm but actually treating the patient.  This is her insurance problem.  I am sure the patient would disagree that the surgery is not medically necessary.  However if they want to waste more money on therapy that is their choice in terms of the insurance company

## 2023-09-19 NOTE — Telephone Encounter (Signed)
 Location changed and placed as urgent

## 2023-09-19 NOTE — Telephone Encounter (Signed)
PT referral placed in chart.

## 2023-09-19 NOTE — Addendum Note (Signed)
 Addended by: Catherene Close on: 09/19/2023 01:33 PM   Modules accepted: Orders

## 2023-09-23 ENCOUNTER — Encounter: Payer: Self-pay | Admitting: Gastroenterology

## 2023-09-23 ENCOUNTER — Ambulatory Visit: Payer: Self-pay | Admitting: Family Medicine

## 2023-09-23 NOTE — Progress Notes (Signed)
 Sally Reid,  The polyp that I removed during your recent procedure was completely benign but was proven to be a "pre-cancerous" polyp that MAY have grown into cancers if it had not been removed.  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  This finding means that it was much more likely to have turned into cancer had it not been removed.  For this reason, I would recommend close surveillance to ensure that no residual polyp tissue remained or recurred following the polypectomy.  I recommend a repeat colonoscopy in 1 year.    If you develop any new rectal bleeding, abdominal pain or significant bowel habit changes, please contact me before then.

## 2023-09-23 NOTE — Telephone Encounter (Signed)
FYI   Will forward to provider  

## 2023-09-23 NOTE — Telephone Encounter (Signed)
 Chief Complaint: Pain on right side of abdomen x2 days  Symptoms: "Sharp pain," stomach pain is "throbbing," radiates to lower back, a little constipation Frequency: Constant but intermittently gets worse Pertinent Negatives: Patient denies diarrhea, pain with urination, nausea, vomiting, fever  Disposition: [x] ED  Additional Notes: Pt states she had a colonoscopy on 4/9 and a polyp was removed. Pt is not sure if this could be related. This RN recommend pt goes to ED and has another adult drive her there. Pt states understanding and agrees to plan.   Copied from CRM [id number]. Topic: Clinical - Red Word Triage >> Sep 23, 2023 11:14 AM Juluis Ok wrote: [facility] that prompted transfer to Nurse Triage: Pain-rt side abdomen and back Reason for Disposition  [1] SEVERE pain (e.g., excruciating) AND [2] present > 1 hour  Answer Assessment - Initial Assessment Questions LOCATION: "Where does it hurt?"      Pain on right side of abdomen that sometimes radiates to back RADIATION: "Does the pain shoot anywhere else?" (e.g., chest, back)     Back ONSET: "When did the pain begin?" (e.g., minutes, hours or days ago)      Saturday SEVERITY: "How bad is the pain?"  (e.g., Scale 1-10; mild, moderate, or severe)    - MILD (1-3): Doesn't interfere with normal activities, abdomen soft and not tender to touch.     - MODERATE (4-7): Interferes with normal activities or awakens from sleep, abdomen tender to touch.     - SEVERE (8-10): Excruciating pain, doubled over, unable to do any normal activities.       All the time 10/10 RECURRENT SYMPTOM: "Have you ever had this type of stomach pain before?" If Yes, ask: "When was the last time?" and "What happened that time?"      Denies CAUSE: "What do you think is causing the stomach pain?"     Denies  Protocols used: Abdominal Pain - [facility]

## 2023-09-24 ENCOUNTER — Ambulatory Visit (HOSPITAL_COMMUNITY): Admission: RE | Admit: 2023-09-24 | Source: Ambulatory Visit | Admitting: Orthopedic Surgery

## 2023-09-24 ENCOUNTER — Other Ambulatory Visit: Payer: Self-pay

## 2023-09-24 ENCOUNTER — Ambulatory Visit: Attending: Orthopedic Surgery | Admitting: Physical Therapy

## 2023-09-24 ENCOUNTER — Encounter (HOSPITAL_COMMUNITY): Admission: RE | Payer: Self-pay | Source: Ambulatory Visit

## 2023-09-24 ENCOUNTER — Encounter: Payer: Self-pay | Admitting: Physical Therapy

## 2023-09-24 DIAGNOSIS — M25511 Pain in right shoulder: Secondary | ICD-10-CM | POA: Diagnosis present

## 2023-09-24 DIAGNOSIS — R293 Abnormal posture: Secondary | ICD-10-CM | POA: Diagnosis present

## 2023-09-24 DIAGNOSIS — M25611 Stiffness of right shoulder, not elsewhere classified: Secondary | ICD-10-CM | POA: Diagnosis present

## 2023-09-24 DIAGNOSIS — M75111 Incomplete rotator cuff tear or rupture of right shoulder, not specified as traumatic: Secondary | ICD-10-CM | POA: Diagnosis not present

## 2023-09-24 DIAGNOSIS — M6281 Muscle weakness (generalized): Secondary | ICD-10-CM | POA: Diagnosis present

## 2023-09-24 DIAGNOSIS — Z01818 Encounter for other preprocedural examination: Secondary | ICD-10-CM

## 2023-09-24 SURGERY — ARTHROSCOPY, SHOULDER WITH REPAIR, ROTATOR CUFF, OPEN
Anesthesia: General | Laterality: Right

## 2023-09-24 NOTE — Therapy (Signed)
 OUTPATIENT PHYSICAL THERAPY UPPER EXTREMITY EVALUATION   Patient Name: Sally Reid MRN: [id number] DOB:03/07/1961, 63 y.o., female Today's Date: 09/24/2023  END OF SESSION:  PT End of Session - 09/24/23 1021     Visit Number 1    Number of Visits 13    Date for PT Re-Evaluation 11/19/23    Authorization Type [employer]    PT Start Time 1017    PT Stop Time 1101    PT Time Calculation (min) 44 min    Activity Tolerance Patient tolerated treatment well    Behavior During Therapy WFL for tasks assessed/performed             Past Medical History:  Diagnosis Date   Allergy     Arthritis    back/R shoulder   Chronic hepatitis C (HCC)    Fatty liver    GERD (gastroesophageal reflux disease)    Hyperlipidemia    Hypertension    on meds   Past Surgical History:  Procedure Laterality Date   ABDOMINAL HYSTERECTOMY  2005   ANKLE FRACTURE SURGERY Left 2011   CESAREAN SECTION     2 previous   KNEE ARTHROSCOPY Left 2008   for cartilage repair   LEG SURGERY  2011   TUBAL LIGATION     Patient Active Problem List   Diagnosis Date Noted   Hypokalemia 08/16/2023   Rotator cuff tear arthropathy of right shoulder 08/15/2023   Obstipation 08/15/2023   Chronic hepatitis C without hepatic coma (HCC) 04/21/2021   Left leg pain 04/18/2021   History of ankle fracture 04/18/2021   Tobacco use 04/18/2021   Essential hypertension 03/23/2020   Fatty liver 03/23/2020   Morbid obesity with BMI of 45.0-49.9, adult (HCC) 03/23/2020   Multiple thyroid  nodules 07/14/2018    PCP: Joaquin Mulberry, MD  REFERRING PROVIDER: Jasmine Mesi, MD   REFERRING DIAG: Nontraumatic incomplete tear of right rotator cuff [M75.111]   THERAPY DIAG:  Right shoulder pain, unspecified chronicity  Muscle weakness (generalized)  Decreased ROM of right shoulder  Rationale for Evaluation and Treatment: Rehabilitation  ONSET DATE: February 2023  SUBJECTIVE:                                                                                                                                                                                       SUBJECTIVE STATEMENT: The R shoulder started hurting last year, that the pain in the shoulder just gradually got worse. She notes challenges with  lifting/ opening jars. The pain started 10 months ago with no specific onset or cause. Pain is in the front of the shoulder, and has started in the  last 2 months going to the neck. She denies any N/T. Noted challenges with manipulating items with the right hands. She reports the pain since onset she notes the pain is worsening.   Hand dominance: Right  PERTINENT HISTORY: See PMHx section  PAIN:  Are you having pain? Yes: NPRS scale: 7/10 at worst pain 10/10 Pain location: R anterior shoulder Pain description: constantly, throbbing, uncomfortable Aggravating factors: any use of the Right hand Relieving factors: medication, rest/ getting out of the painful psotion.   PRECAUTIONS: None  RED FLAGS: None   WEIGHT BEARING RESTRICTIONS: No  FALLS:  Has patient fallen in last 6 months? 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   PLOF: Independent  PATIENT GOALS: For the shoulder to feel better, and move the arm more, gardening and out activities.    OBJECTIVE:  Note: Objective measures were completed at Evaluation unless otherwise noted.  DIAGNOSTIC FINDINGS:  MRI R shoulder 07/08/23 IMPRESSION: 1. Mild supraspinatus tendinosis with a partial-thickness bursal surface tear anteriorly with a small interstitial component extending more posteriorly. 2. Mild infraspinatus tendinosis.  PATIENT SURVEYS :  Quick Dash 93.2/100 = 93.2%  COGNITION: Overall cognitive status: Within functional limits for tasks  assessed     SENSATION: Not tested  POSTURE: Forward head posture, anteriorly rolled shoulders.   UPPER EXTREMITY ROM:   Active ROM / Passive Right eval Left eval  Shoulder flexion 92 A P! / 90 P! 160  Shoulder extension 17 AP! 58  Shoulder abduction 74 AP!/ 90 P! 122  Shoulder adduction    Shoulder internal rotation Greater trochanter / to stomach P! T8  Shoulder external rotation R mastoid process/ 25 P!  C7  Elbow flexion    Elbow extension    Wrist flexion    Wrist extension    Wrist ulnar deviation    Wrist radial deviation    Wrist pronation    Wrist supination    (Blank rows = not tested, P!= pain )  Notes: upper trap activation throughout all movements, equal pain noted during both Active and passive movement.   UPPER EXTREMITY MMT:  MMT Right eval Left eval  Shoulder flexion 3/5 P! 4/5  Shoulder extension 3/5 P! 4/5  Shoulder abduction 3/5 P! 4/5  Shoulder adduction    Shoulder internal rotation 3/5 P! 4/5  Shoulder external rotation 3/5 P! 4/5  Middle trapezius    Lower trapezius    Elbow flexion    Elbow extension    Wrist flexion    Wrist extension    Wrist ulnar deviation    Wrist radial deviation    Wrist pronation    Wrist supination    Grip strength (lbs)    (Blank rows = not tested)  Notes: pain with all  RUE testing   JOINT MOBILITY TESTING:  Limited assessment due to guarding and pain response.   PALPATION:  TTP along the R upper trap/ levator/ biceps brachii with multiple trigger points noted. Tenderness along the anterior GHJ, AC joint and the coracoid process.  TREATMENT:  [facility] Adult PT Treatment:                                                DATE: 09/24/23 MTPR along the R upper trap x 2 Upper trap stretch for R 2 x 30 sec Scapular retraction 1 x 10 using distraction techniques Posture and reducing upper  trap activation Provided initial HEP  PATIENT EDUCATION: Education details: evaluation findings, POC, goals, HEP with proper form/ rationale. Person educated: Patient Education method: Explanation, Demonstration, Verbal cues, and Handouts Education comprehension: verbalized understanding  HOME EXERCISE PROGRAM: Access Code: [email or web address] URL: [email or web address] Date: 09/24/2023 Prepared by: Laron Plummer  Exercises - Seated Upper Trapezius Stretch  - 1 x daily - 7 x weekly - 2 sets - 3 reps - 30-60 sec hold - Seated Scapular Retraction  - 1 x daily - 7 x weekly - 2 sets - 10 reps - 5 seconds hold - Shoulder Table Slides Flexion  - 2 x daily - 3 sets - 10 reps - 5 hold - Seated Shoulder Scaption Slide at Table Top with Forearm in Neutral  - 1 x daily - 7 x weekly - 2 sets - 10 reps  ASSESSMENT:  CLINICAL IMPRESSION: Patient is a 63 y.o. F who was seen today for physical therapy evaluation and treatment for dx of R shoulder rotator cuff tear. Pt demonstrates significant limtation with R shoulder ROM and strength secondary to pain and guarindg. ROM was limited in both AROM/ PROM with guarding present. She responded well to [facility] along the R upper trap noting reduction in upper trap tension/ cervical sidebending. Provided initial HEP to promote PROM movement and being setting the shoulder blade. She would benefit from physical therapy to decrease R shoulder pain, improve AROM/ strength, and maximize her function by addressing the deficits listed.    OBJECTIVE IMPAIRMENTS: decreased activity tolerance, decreased endurance, decreased ROM, decreased strength, increased fascial restrictions, increased muscle spasms, improper body mechanics, postural dysfunction, and pain.   ACTIVITY LIMITATIONS: carrying, lifting, bathing, dressing, and reach over head  PARTICIPATION LIMITATIONS: meal prep, cleaning, laundry, shopping, community activity, occupation, and yard work  PERSONAL  FACTORS: Past/current experiences and Time since onset of injury/illness/exacerbation are also affecting patient's functional outcome.   REHAB POTENTIAL: Good  CLINICAL DECISION MAKING: Evolving/moderate complexity  EVALUATION COMPLEXITY: Moderate  GOALS: Goals reviewed with patient? Yes  SHORT TERM GOALS: Target date: 10/22/2023  Pt to be IND with initial HEP for therapeutic progression Baseline: No previous HEP Goal status: INITIAL  2.  Improve Quickdash score to </= 87.5% to demo improving function Baseline: initial score 93.5% Goal status: INITIAL  3.  Improve R shoulder Flexion/ abduction to </= 100 degrees actively with </= 5/10 max pain.  Baseline: see flow sheet Goal status: INITIAL  4.  Pt to verbalize/ and demonstrate efficient posture reducing upper trap activation to reduce shoulder tension.  Baseline:  upper trap dominate  Goal status: INITIAL   LONG TERM GOALS: Target date: 11/19/2023  Increase R shoulder flexion/ abduction to >/= 120 degrees to maximize with </= 2/10 pain for functional ROM required for ADLs Baseline: see flowsheet Goal status: INITIAL  2.  Improve R shoulder strength to >/= 4/5 to promote shoulder stabulity and assist with lifting / carrying and ADLs associated with work related tasks.  Baseline:  see flow sheet.  Goal  status: INITIAL  3.  Improve Quickdash to </= 83 to demonsrtrate improvement based on the MCID to demonstrate improvement in function.  Baseline: initial socre 93.5% Goal status: INITIAL  4.  Patient to be IND with all HEP given and is able to maintain and progress current LOF IND Baseline: No previous HEP Goal status: INITIAL  PLAN: PT FREQUENCY: 1-2x/week  PT DURATION: 6 weeks  PLANNED INTERVENTIONS: 97110-Therapeutic exercises, 97530- Therapeutic activity, 97112- Neuromuscular re-education, 97535- Self Care, 54098- Manual therapy, Taping, Dry Needling, Joint mobilization, Cryotherapy, and Moist heat  PLAN FOR NEXT  SESSION: Review/ update HEP PRN. Gentle PROM > AAROM for R GHJ, STW vs DN for the R upper trap/ levator/ bicep brachii, scapular setting and posture education.    Belal Scallon PT, DPT, LAT, ATC  09/24/23  12:12 PM   For all possible CPT codes, reference the Planned Interventions line above.     Check all conditions that are expected to impact treatment: {Conditions expected to impact treatment:Musculoskeletal disorders

## 2023-10-02 ENCOUNTER — Encounter: Admitting: Surgical

## 2023-10-02 ENCOUNTER — Encounter: Payer: Self-pay | Admitting: Physical Therapy

## 2023-10-02 ENCOUNTER — Ambulatory Visit: Admitting: Physical Therapy

## 2023-10-02 DIAGNOSIS — M6281 Muscle weakness (generalized): Secondary | ICD-10-CM

## 2023-10-02 DIAGNOSIS — M25511 Pain in right shoulder: Secondary | ICD-10-CM

## 2023-10-02 DIAGNOSIS — R293 Abnormal posture: Secondary | ICD-10-CM

## 2023-10-02 DIAGNOSIS — M25611 Stiffness of right shoulder, not elsewhere classified: Secondary | ICD-10-CM

## 2023-10-02 NOTE — Therapy (Signed)
 OUTPATIENT PHYSICAL THERAPY DAILY NOTE   Patient Name: Sally Reid MRN: [id number] DOB:[birth year], 63 y.o., female Today's Date: 10/02/2023  END OF SESSION:  PT End of Session - 10/02/23 1414     Visit Number 2    Number of Visits 13    Date for PT Re-Evaluation 11/19/23    Authorization Type [employer]    Authorization Time Period Approved 6 visits 09/24/23-11/22/23    Authorization - Visit Number 1    Authorization - Number of Visits 6    PT Start Time 0215    PT Stop Time 0256    PT Time Calculation (min) 41 min    Activity Tolerance Patient tolerated treatment well    Behavior During Therapy WFL for tasks assessed/performed             Past Medical History:  Diagnosis Date   Allergy     Arthritis    back/R shoulder   Chronic hepatitis C (HCC)    Fatty liver    GERD (gastroesophageal reflux disease)    Hyperlipidemia    Hypertension    on meds   Past Surgical History:  Procedure Laterality Date   ABDOMINAL HYSTERECTOMY  2005   ANKLE FRACTURE SURGERY Left 2011   CESAREAN SECTION     2 previous   KNEE ARTHROSCOPY Left 2008   for cartilage repair   LEG SURGERY  2011   TUBAL LIGATION     Patient Active Problem List   Diagnosis Date Noted   Hypokalemia 08/16/2023   Rotator cuff tear arthropathy of right shoulder 08/15/2023   Obstipation 08/15/2023   Chronic hepatitis C without hepatic coma (HCC) 04/21/2021   Left leg pain 04/18/2021   History of ankle fracture 04/18/2021   Tobacco use 04/18/2021   Essential hypertension 03/23/2020   Fatty liver 03/23/2020   Morbid obesity with BMI of 45.0-49.9, adult (HCC) 03/23/2020   Multiple thyroid  nodules 07/14/2018    PCP: Joaquin Mulberry, MD  REFERRING PROVIDER: Jasmine Mesi, MD   REFERRING DIAG: Nontraumatic incomplete tear of right rotator cuff [M75.111]   THERAPY DIAG:  Right shoulder pain, unspecified chronicity  Muscle weakness (generalized)  Decreased ROM of right  shoulder  Abnormal posture  Rationale for Evaluation and Treatment: Rehabilitation  ONSET DATE: February 2023  SUBJECTIVE:                                                                                                                                                                                      SUBJECTIVE STATEMENT:  10/02/2023:  Pt reports that her shoulder is about the same.  Her pain has been higher than usual so  she has not completed her HEP regularly.  EVAL: The R shoulder started hurting last year, that the pain in the shoulder just gradually got worse. She notes challenges with  lifting/ opening jars. The pain started 10 months ago with no specific onset or cause. Pain is in the front of the shoulder, and has started in the last 2 months going to the neck. She denies any N/T. Noted challenges with manipulating items with the right hands. She reports the pain since onset she notes the pain is worsening.   Hand dominance: Right  PERTINENT HISTORY: See PMHx section  PAIN:  Are you having pain? Yes: NPRS scale: 7/10 at worst pain 10/10 Pain location: R anterior shoulder Pain description: constantly, throbbing, uncomfortable Aggravating factors: any use of the Right hand Relieving factors: medication, rest/ getting out of the painful psotion.   PRECAUTIONS: None  RED FLAGS: None   WEIGHT BEARING RESTRICTIONS: No  FALLS:  Has patient fallen in last 6 months? 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   PLOF: Independent  PATIENT GOALS: For the shoulder to feel better, and move the arm more, gardening and out activities.    OBJECTIVE:  Note: Objective measures were completed at Evaluation unless otherwise noted.  DIAGNOSTIC FINDINGS:  MRI R shoulder 07/08/23 IMPRESSION: 1. Mild supraspinatus tendinosis with  a partial-thickness bursal surface tear anteriorly with a small interstitial component extending more posteriorly. 2. Mild infraspinatus tendinosis.  PATIENT SURVEYS :  Quick Dash 93.2/100 = 93.2%  COGNITION: Overall cognitive status: Within functional limits for tasks assessed     SENSATION: Not tested  POSTURE: Forward head posture, anteriorly rolled shoulders.   UPPER EXTREMITY ROM:   Active ROM / Passive Right eval Left eval  Shoulder flexion 92 A P! / 90 P! 160  Shoulder extension 17 AP! 58  Shoulder abduction 74 AP!/ 90 P! 122  Shoulder adduction    Shoulder internal rotation Greater trochanter / to stomach P! T8  Shoulder external rotation R mastoid process/ 25 P!  C7  Elbow flexion    Elbow extension    Wrist flexion    Wrist extension    Wrist ulnar deviation    Wrist radial deviation    Wrist pronation    Wrist supination    (Blank rows = not tested, P!= pain )  Notes: upper trap activation throughout all movements, equal pain noted during both Active and passive movement.   UPPER EXTREMITY MMT:  MMT Right eval Left eval  Shoulder flexion 3/5 P! 4/5  Shoulder extension 3/5 P! 4/5  Shoulder abduction 3/5 P! 4/5  Shoulder adduction    Shoulder internal rotation 3/5 P! 4/5  Shoulder external rotation 3/5 P! 4/5  Middle trapezius    Lower trapezius    Elbow flexion    Elbow extension    Wrist flexion    Wrist extension    Wrist ulnar deviation    Wrist radial deviation    Wrist pronation    Wrist supination    Grip strength (lbs)    (Blank rows = not tested)  Notes: pain with all  RUE testing   JOINT MOBILITY TESTING:  Limited assessment due to guarding and pain response.   PALPATION:  TTP along the R upper trap/ levator/ biceps brachii with multiple trigger points noted. Tenderness along the anterior GHJ, AC joint and  the coracoid process.                                                                                                                               TREATMENT:  [facility] Adult PT Treatment  10/02/2023:  Therapeutic Exercise: Chest press with dowel - stopped d/t pain Flexion arc in supine - stopped d/t pain RTB row - 3x10 YTB ext - 3x10 Scap retraction - 2x10 Pulley - 2'  Manual Therapy  AP mob G I - II Gentle ER ROM STM R UT with heat pack applied concurrently  HOME EXERCISE PROGRAM: Access Code: [email or web address] URL: [email or web address] Date: 10/02/2023 Prepared by: Lesleigh Rash  Exercises - Seated Upper Trapezius Stretch  - 1 x daily - 7 x weekly - 2 sets - 3 reps - 30-60 sec hold - Seated Scapular Retraction  - 1 x daily - 7 x weekly - 2 sets - 10 reps - 5 seconds hold - Shoulder Table Slides Flexion  - 2 x daily - 3 sets - 10 reps - 5 hold - Seated Shoulder Scaption Slide at Table Top with Forearm in Neutral  - 1 x daily - 7 x weekly - 2 sets - 10 reps - Standing Shoulder Row with Anchored Resistance  - 1 x daily - 7 x weekly - 3 sets - 10 reps - Shoulder extension with resistance - Neutral  - 1 x daily - 7 x weekly - 3 sets - 10 reps  ASSESSMENT:  CLINICAL IMPRESSION:  10/02/2023:  Moira Andrews tolerated session fair with no adverse reaction.  She is limited by pain during all therex.  Exercises modified or d/c'd d/t pain as noted.  Introduced row and shoulder ext with light resistance and updated HEP to include these.  Pt reports significant improvement in R UT pain following MT.  EVAL: Patient is a 63 y.o. F who was seen today for physical therapy evaluation and treatment for dx of R shoulder rotator cuff tear. Pt demonstrates significant limtation with R shoulder ROM and strength secondary to pain and guarindg. ROM was limited in both AROM/ PROM with guarding present. She responded well to [facility] along the R upper trap noting reduction in upper trap tension/ cervical sidebending. Provided initial HEP to promote PROM movement and being setting the shoulder blade. She would benefit from physical  therapy to decrease R shoulder pain, improve AROM/ strength, and maximize her function by addressing the deficits listed.    OBJECTIVE IMPAIRMENTS: decreased activity tolerance, decreased endurance, decreased ROM, decreased strength, increased fascial restrictions, increased muscle spasms, improper body mechanics, postural dysfunction, and pain.   ACTIVITY LIMITATIONS: carrying, lifting, bathing, dressing, and reach over head  PARTICIPATION LIMITATIONS: meal prep, cleaning, laundry, shopping, community activity, occupation, and yard work  PERSONAL FACTORS: Past/current experiences and Time since onset of injury/illness/exacerbation are also affecting patient's functional outcome.   REHAB POTENTIAL: Good  CLINICAL DECISION MAKING: Evolving/moderate complexity  EVALUATION COMPLEXITY: Moderate  GOALS: Goals  reviewed with patient? Yes  SHORT TERM GOALS: Target date: 10/22/2023  Pt to be IND with initial HEP for therapeutic progression Baseline: No previous HEP Goal status: INITIAL  2.  Improve Quickdash score to </= 87.5% to demo improving function Baseline: initial score 93.5% Goal status: INITIAL  3.  Improve R shoulder Flexion/ abduction to </= 100 degrees actively with </= 5/10 max pain.  Baseline: see flow sheet Goal status: INITIAL  4.  Pt to verbalize/ and demonstrate efficient posture reducing upper trap activation to reduce shoulder tension.  Baseline:  upper trap dominate  Goal status: INITIAL   LONG TERM GOALS: Target date: 11/19/2023  Increase R shoulder flexion/ abduction to >/= 120 degrees to maximize with </= 2/10 pain for functional ROM required for ADLs Baseline: see flowsheet Goal status: INITIAL  2.  Improve R shoulder strength to >/= 4/5 to promote shoulder stabulity and assist with lifting / carrying and ADLs associated with work related tasks.  Baseline:  see flow sheet.  Goal status: INITIAL  3.  Improve Quickdash to </= 83 to demonsrtrate improvement  based on the MCID to demonstrate improvement in function.  Baseline: initial socre 93.5% Goal status: INITIAL  4.  Patient to be IND with all HEP given and is able to maintain and progress current LOF IND Baseline: No previous HEP Goal status: INITIAL  PLAN: PT FREQUENCY: 1-2x/week  PT DURATION: 6 weeks  PLANNED INTERVENTIONS: 97110-Therapeutic exercises, 97530- Therapeutic activity, 97112- Neuromuscular re-education, 97535- Self Care, 69629- Manual therapy, Taping, Dry Needling, Joint mobilization, Cryotherapy, and Moist heat  PLAN FOR NEXT SESSION: Review/ update HEP PRN. Gentle PROM > AAROM for R GHJ, STW vs DN for the R upper trap/ levator/ bicep brachii, scapular setting and posture education.    Adaijah Endres E Katalina Magri PT 10/02/23  3:06 PM   For all possible CPT codes, reference the Planned Interventions line above.     Check all conditions that are expected to impact treatment: {Conditions expected to impact treatment:Musculoskeletal disorders

## 2023-10-04 ENCOUNTER — Ambulatory Visit: Admitting: Physical Therapy

## 2023-10-08 ENCOUNTER — Encounter: Admitting: Physical Therapy

## 2023-10-08 ENCOUNTER — Encounter: Payer: Self-pay | Admitting: Physical Therapy

## 2023-10-08 ENCOUNTER — Ambulatory Visit: Attending: Orthopedic Surgery | Admitting: Physical Therapy

## 2023-10-08 DIAGNOSIS — M6281 Muscle weakness (generalized): Secondary | ICD-10-CM | POA: Insufficient documentation

## 2023-10-08 DIAGNOSIS — M25611 Stiffness of right shoulder, not elsewhere classified: Secondary | ICD-10-CM | POA: Diagnosis present

## 2023-10-08 DIAGNOSIS — M25511 Pain in right shoulder: Secondary | ICD-10-CM | POA: Diagnosis present

## 2023-10-08 DIAGNOSIS — R293 Abnormal posture: Secondary | ICD-10-CM | POA: Diagnosis present

## 2023-10-08 NOTE — Patient Instructions (Signed)
 Trigger Point Dry Needling  What is Trigger Point Dry Needling (DN)? DN is a physical therapy technique used to treat muscle pain and dysfunction. Specifically, DN helps deactivate muscle trigger points (muscle knots).  A thin filiform needle is used to penetrate the skin and stimulate the underlying trigger point. The goal is for a local twitch response (LTR) to occur and for the trigger point to relax. No medication of any kind is injected during the procedure.   What Does Trigger Point Dry Needling Feel Like?  The procedure feels different for each individual patient. Some patients report that they do not actually feel the needle enter the skin and overall the process is not painful. Very mild bleeding may occur. However, many patients feel a deep cramping in the muscle in which the needle was inserted. This is the local twitch response.   How Will I feel after the treatment? Soreness is normal, and the onset of soreness may not occur for a few hours. Typically this soreness does not last longer than two days.  Bruising is uncommon, however; ice can be used to decrease any possible bruising.  In rare cases feeling tired or nauseous after the treatment is normal. In addition, your symptoms may get worse before they get better, this period will typically not last longer than 24 hours.   What Can I do After My Treatment? Increase your hydration by drinking more water for the next 24 hours.  You may place ice or heat on the areas treated that have become sore, however, do not use heat on inflamed or bruised areas. Heat often brings more relief post needling. You can continue your regular activities, but vigorous activity is not recommended initially after the treatment for 24 hours. DN is best combined with other physical therapy such as strengthening, stretching, and other therapies.   What are the complications? While your therapist has had extensive training in minimizing the risks of trigger  point dry needling, it is important to understand the risks of any procedure.  Risks include bleeding, pain, fatigue, hematoma, infection, vertigo, nausea or nerve involvement. Monitor for any changes to your skin or sensation. Contact your therapist or MD with concerns.  A rare but serious complication is a pneumothorax over or near your middle and upper chest and back If you have dry needling in this area, monitor for the following symptoms: Shortness of breath on exertion and/or Difficulty taking a deep breath and/or Chest Pain and/or A dry cough If any of the above symptoms develop, please go to the nearest emergency room or call 911. Tell them you had dry needling over your thorax and report any symptoms you are having. Please follow-up with your treating therapist after you complete the medical evaluation.   Sharlet Dawson, PT, [facility] Certified Exercise Expert for the Aging Adult  10/08/23 10:27 AM Phone: [phone] Fax: [phone]

## 2023-10-08 NOTE — Therapy (Signed)
 OUTPATIENT PHYSICAL THERAPY DAILY NOTE   Patient Name: Sally Reid MRN: [id number] DOB:[birth year], 63 y.o., female Today's Date: 10/08/2023  END OF SESSION:  PT End of Session - 10/08/23 1020     Visit Number 3    Number of Visits 13    Date for PT Re-Evaluation 11/19/23    Authorization Type [employer]    Authorization - Number of Visits 6    PT Start Time 1018    PT Stop Time 1100    PT Time Calculation (min) 42 min    Activity Tolerance Patient tolerated treatment well    Behavior During Therapy WFL for tasks assessed/performed              Past Medical History:  Diagnosis Date   Allergy     Arthritis    back/R shoulder   Chronic hepatitis C (HCC)    Fatty liver    GERD (gastroesophageal reflux disease)    Hyperlipidemia    Hypertension    on meds   Past Surgical History:  Procedure Laterality Date   ABDOMINAL HYSTERECTOMY  2005   ANKLE FRACTURE SURGERY Left 2011   CESAREAN SECTION     2 previous   KNEE ARTHROSCOPY Left 2008   for cartilage repair   LEG SURGERY  2011   TUBAL LIGATION     Patient Active Problem List   Diagnosis Date Noted   Hypokalemia 08/16/2023   Rotator cuff tear arthropathy of right shoulder 08/15/2023   Obstipation 08/15/2023   Chronic hepatitis C without hepatic coma (HCC) 04/21/2021   Left leg pain 04/18/2021   History of ankle fracture 04/18/2021   Tobacco use 04/18/2021   Essential hypertension 03/23/2020   Fatty liver 03/23/2020   Morbid obesity with BMI of 45.0-49.9, adult (HCC) 03/23/2020   Multiple thyroid  nodules 07/14/2018    PCP: Joaquin Mulberry, MD  REFERRING PROVIDER: Jasmine Mesi, MD   REFERRING DIAG: Nontraumatic incomplete tear of right rotator cuff [M75.111]   THERAPY DIAG:  Right shoulder pain, unspecified chronicity  Muscle weakness (generalized)  Decreased ROM of right shoulder  Abnormal posture  Rationale for Evaluation and Treatment: Rehabilitation  ONSET DATE:  February 2023  SUBJECTIVE:                                                                                                                                                                                      SUBJECTIVE STATEMENT:  10/08/2023:  Pt reports that her shoulder is about the same.  Her pain has been higher than usual so she has not completed her HEP regularly.  EVAL: The R shoulder started hurting last year, that  the pain in the shoulder just gradually got worse. She notes challenges with  lifting/ opening jars. The pain started 10 months ago with no specific onset or cause. Pain is in the front of the shoulder, and has started in the last 2 months going to the neck. She denies any N/T. Noted challenges with manipulating items with the right hands. She reports the pain since onset she notes the pain is worsening.   Hand dominance: Right  PERTINENT HISTORY: See PMHx section  PAIN:  Are you having pain? Yes: NPRS scale: 7/10 at worst pain 10/10 Pain location: R anterior shoulder Pain description: constantly, throbbing, uncomfortable Aggravating factors: any use of the Right hand Relieving factors: medication, rest/ getting out of the painful psotion.   PRECAUTIONS: None  RED FLAGS: None   WEIGHT BEARING RESTRICTIONS: No  FALLS:  Has patient fallen in last 6 months? 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   PLOF: Independent  PATIENT GOALS: For the shoulder to feel better, and move the arm more, gardening and out activities.    OBJECTIVE:  Note: Objective measures were completed at Evaluation unless otherwise noted.  DIAGNOSTIC FINDINGS:  MRI R shoulder 07/08/23 IMPRESSION: 1. Mild supraspinatus tendinosis with a partial-thickness bursal surface tear anteriorly with a small interstitial component extending  more posteriorly. 2. Mild infraspinatus tendinosis.  PATIENT SURVEYS :  Quick Dash 93.2/100 = 93.2%  COGNITION: Overall cognitive status: Within functional limits for tasks assessed     SENSATION: Not tested  POSTURE: Forward head posture, anteriorly rolled shoulders.   UPPER EXTREMITY ROM:   Active ROM / Passive Right eval Left eval  Shoulder flexion 92 A P! / 90 P! 160  Shoulder extension 17 AP! 58  Shoulder abduction 74 AP!/ 90 P! 122  Shoulder adduction    Shoulder internal rotation Greater trochanter / to stomach P! T8  Shoulder external rotation R mastoid process/ 25 P!  C7  Elbow flexion    Elbow extension    Wrist flexion    Wrist extension    Wrist ulnar deviation    Wrist radial deviation    Wrist pronation    Wrist supination    (Blank rows = not tested, P!= pain )  Notes: upper trap activation throughout all movements, equal pain noted during both Active and passive movement.   UPPER EXTREMITY MMT:  MMT Right eval Left eval  Shoulder flexion 3/5 P! 4/5  Shoulder extension 3/5 P! 4/5  Shoulder abduction 3/5 P! 4/5  Shoulder adduction    Shoulder internal rotation 3/5 P! 4/5  Shoulder external rotation 3/5 P! 4/5  Middle trapezius    Lower trapezius    Elbow flexion    Elbow extension    Wrist flexion    Wrist extension    Wrist ulnar deviation    Wrist radial deviation    Wrist pronation    Wrist supination    Grip strength (lbs)    (Blank rows = not tested)  Notes: pain with all  RUE testing   JOINT MOBILITY TESTING:  Limited assessment due to guarding and pain response.   PALPATION:  TTP along the R upper trap/ levator/ biceps brachii with multiple trigger points noted. Tenderness along the anterior GHJ, AC joint and the coracoid process.  TREATMENT:  [facility] Adult PT Treatment:                                                 DATE: 10-08-23 Therapeutic Exercise: Chest press with dowel  1 x 10 Flexion arc in supine -  1 x 10 RTB row - 1x10 RTB ext - 1x10 Scap retraction - 2x10 UT stretch LS stretch Manual Therapy: STW of Right UT/LS, subscapularis and cervical paraspinals and R infrastpinatus Gentle cervical distraction Trigger Point Dry Needling  Initial Treatment: Pt instructed on Dry Needling rational, procedures, and possible side effects. Pt instructed to expect mild to moderate muscle soreness later in the day and/or into the next day.  Pt instructed in methods to reduce muscle soreness. Pt instructed to continue prescribed HEP. Because Dry Needling was performed over or adjacent to a lung field, pt was educated on S/S of pneumothorax and to seek immediate medical attention should they occur.  Patient was educated on signs and symptoms of infection and other risk factors and advised to seek medical attention should they occur.  Patient verbalized understanding of these instructions and education.   Patient Verbal Consent Given: Yes Education Handout Provided: Yes Muscles Treated: Right UT /LS  Right subscapularis, infraspinatus Electrical Stimulation Performed: No Treatment Response/Outcome: Twitch response and muscle tension release    [facility] Adult PT Treatment  10/02/2023:  Therapeutic Exercise: Chest press with dowel - stopped d/t pain Flexion arc in supine - stopped d/t pain RTB row - 3x10 YTB ext - 3x10 Scap retraction - 2x10 Pulley - 2'  Manual Therapy  AP mob G I - II Gentle ER ROM STM R UT with heat pack applied concurrently  HOME EXERCISE PROGRAM: Access Code: [email or web address] URL: [email or web address] Date: 10/02/2023 Prepared by: Lesleigh Rash  Exercises - Seated Upper Trapezius Stretch  - 1 x daily - 7 x weekly - 2 sets - 3 reps - 30-60 sec hold - Seated Scapular Retraction  - 1 x daily - 7 x weekly - 2 sets - 10 reps - 5 seconds hold - Shoulder Table  Slides Flexion  - 2 x daily - 3 sets - 10 reps - 5 hold - Seated Shoulder Scaption Slide at Table Top with Forearm in Neutral  - 1 x daily - 7 x weekly - 2 sets - 10 reps - Standing Shoulder Row with Anchored Resistance  - 1 x daily - 7 x weekly - 3 sets - 10 reps - Shoulder extension with resistance - Neutral  - 1 x daily - 7 x weekly - 3 sets - 10 reps Added 10-08-23  - Gentle Levator Scapulae Stretch  - 1 x daily - 7 x weekly - 1 sets - 3 reps - 15-30 sec hold   ASSESSMENT:  CLINICAL IMPRESSION:  10/08/2023:  Sally Reid enters clinic with with 9/10 and consents to TPDN and is closely monitored throughout session. Pt with marked twitch response for UT/LS R and infraspinatus. tolerated session completing 10 x each exercise but no more with no adverse reaction.  She is limited by pain during all therex but improved ot 6/10 at end of session Improvement of motion after TPDN and manual  EVAL: Patient is a 63 y.o. F who was seen today for physical therapy evaluation and treatment for dx of R shoulder rotator cuff tear. Pt demonstrates significant limtation with  R shoulder ROM and strength secondary to pain and guarindg. ROM was limited in both AROM/ PROM with guarding present. She responded well to [facility] along the R upper trap noting reduction in upper trap tension/ cervical sidebending. Provided initial HEP to promote PROM movement and being setting the shoulder blade. She would benefit from physical therapy to decrease R shoulder pain, improve AROM/ strength, and maximize her function by addressing the deficits listed.    OBJECTIVE IMPAIRMENTS: decreased activity tolerance, decreased endurance, decreased ROM, decreased strength, increased fascial restrictions, increased muscle spasms, improper body mechanics, postural dysfunction, and pain.   ACTIVITY LIMITATIONS: carrying, lifting, bathing, dressing, and reach over head  PARTICIPATION LIMITATIONS: meal prep, cleaning, laundry, shopping, community  activity, occupation, and yard work  PERSONAL FACTORS: Past/current experiences and Time since onset of injury/illness/exacerbation are also affecting patient's functional outcome.   REHAB POTENTIAL: Good  CLINICAL DECISION MAKING: Evolving/moderate complexity  EVALUATION COMPLEXITY: Moderate  GOALS: Goals reviewed with patient? Yes  SHORT TERM GOALS: Target date: 10/22/2023  Pt to be IND with initial HEP for therapeutic progression Baseline: No previous HEP Goal status: INITIAL  2.  Improve Quickdash score to </= 87.5% to demo improving function Baseline: initial score 93.5% Goal status: INITIAL  3.  Improve R shoulder Flexion/ abduction to </= 100 degrees actively with </= 5/10 max pain.  Baseline: see flow sheet Goal status: INITIAL  4.  Pt to verbalize/ and demonstrate efficient posture reducing upper trap activation to reduce shoulder tension.  Baseline:  upper trap dominate  Goal status: INITIAL   LONG TERM GOALS: Target date: 11/19/2023  Increase R shoulder flexion/ abduction to >/= 120 degrees to maximize with </= 2/10 pain for functional ROM required for ADLs Baseline: see flowsheet Goal status: INITIAL  2.  Improve R shoulder strength to >/= 4/5 to promote shoulder stabulity and assist with lifting / carrying and ADLs associated with work related tasks.  Baseline:  see flow sheet.  Goal status: INITIAL  3.  Improve Quickdash to </= 83 to demonsrtrate improvement based on the MCID to demonstrate improvement in function.  Baseline: initial socre 93.5% Goal status: INITIAL  4.  Patient to be IND with all HEP given and is able to maintain and progress current LOF IND Baseline: No previous HEP Goal status: INITIAL  PLAN: PT FREQUENCY: 1-2x/week  PT DURATION: 6 weeks  PLANNED INTERVENTIONS: 97110-Therapeutic exercises, 97530- Therapeutic activity, 97112- Neuromuscular re-education, 97535- Self Care, 16109- Manual therapy, Taping, Dry Needling, Joint  mobilization, Cryotherapy, and Moist heat  PLAN FOR NEXT SESSION: Review/ update HEP PRN. Gentle PROM > AAROM for R GHJ, STW vs DN for the R upper trap/ levator/ bicep brachii, scapular setting and posture education.    Sharlet Dawson, PT, [employer] Certified Exercise Expert for the Aging Adult  10/08/23 11:04 AM Phone: [phone] Fax: [phone]    For all possible CPT codes, reference the Planned Interventions line above.     Check all conditions that are expected to impact treatment: {Conditions expected to impact treatment:Musculoskeletal disorders

## 2023-10-11 ENCOUNTER — Ambulatory Visit: Admitting: Physical Therapy

## 2023-10-15 ENCOUNTER — Encounter: Admitting: Physical Therapy

## 2023-10-16 NOTE — Therapy (Incomplete)
 OUTPATIENT PHYSICAL THERAPY DAILY NOTE   Patient Name: Sally Reid MRN: [id number] DOB:08/10/1960, 63 y.o., female Today's Date: 10/16/2023  END OF SESSION:     Past Medical History:  Diagnosis Date   Allergy     Arthritis    back/R shoulder   Chronic hepatitis C (HCC)    Fatty liver    GERD (gastroesophageal reflux disease)    Hyperlipidemia    Hypertension    on meds   Past Surgical History:  Procedure Laterality Date   ABDOMINAL HYSTERECTOMY  2005   ANKLE FRACTURE SURGERY Left 2011   CESAREAN SECTION     2 previous   KNEE ARTHROSCOPY Left 2008   for cartilage repair   LEG SURGERY  2011   TUBAL LIGATION     Patient Active Problem List   Diagnosis Date Noted   Hypokalemia 08/16/2023   Rotator cuff tear arthropathy of right shoulder 08/15/2023   Obstipation 08/15/2023   Chronic hepatitis C without hepatic coma (HCC) 04/21/2021   Left leg pain 04/18/2021   History of ankle fracture 04/18/2021   Tobacco use 04/18/2021   Essential hypertension 03/23/2020   Fatty liver 03/23/2020   Morbid obesity with BMI of 45.0-49.9, adult (HCC) 03/23/2020   Multiple thyroid  nodules 07/14/2018    PCP: Sally Mulberry, MD  REFERRING PROVIDER: Jasmine Mesi, MD   REFERRING DIAG: Nontraumatic incomplete tear of right rotator cuff [M75.111]   THERAPY DIAG:  No diagnosis found.  Rationale for Evaluation and Treatment: Rehabilitation  ONSET DATE: February 2023  SUBJECTIVE:                                                                                                                                                                                      SUBJECTIVE STATEMENT:  10/16/2023:  Pt reports that her shoulder is about the same.  Her pain has been higher than usual so she has not completed her HEP regularly.  EVAL: The R shoulder started hurting last year, that the pain in the shoulder just gradually got worse. She notes challenges with  lifting/ opening jars.  The pain started 10 months ago with no specific onset or cause. Pain is in the front of the shoulder, and has started in the last 2 months going to the neck. She denies any N/T. Noted challenges with manipulating items with the right hands. She reports the pain since onset she notes the pain is worsening.   Hand dominance: Right  PERTINENT HISTORY: See PMHx section  PAIN:  Are you having pain? Yes: NPRS scale: 7/10 at worst pain 10/10 Pain location: R anterior shoulder Pain description: constantly, throbbing, uncomfortable Aggravating factors: any use  of the Right hand Relieving factors: medication, rest/ getting out of the painful psotion.   PRECAUTIONS: None  RED FLAGS: None   WEIGHT BEARING RESTRICTIONS: No  FALLS:  Has patient fallen in last 6 months? 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   PLOF: Independent  PATIENT GOALS: For the shoulder to feel better, and move the arm more, gardening and out activities.    OBJECTIVE:  Note: Objective measures were completed at Evaluation unless otherwise noted.  DIAGNOSTIC FINDINGS:  MRI R shoulder 07/08/23 IMPRESSION: 1. Mild supraspinatus tendinosis with a partial-thickness bursal surface tear anteriorly with a small interstitial component extending more posteriorly. 2. Mild infraspinatus tendinosis.  PATIENT SURVEYS :  Quick Dash 93.2/100 = 93.2%  COGNITION: Overall cognitive status: Within functional limits for tasks assessed     SENSATION: Not tested  POSTURE: Forward head posture, anteriorly rolled shoulders.   UPPER EXTREMITY ROM:   Active ROM / Passive Right eval Left eval  Shoulder flexion 92 A P! / 90 P! 160  Shoulder extension 17 AP! 58  Shoulder abduction 74 AP!/ 90 P! 122  Shoulder adduction    Shoulder internal rotation Greater trochanter /  to stomach P! T8  Shoulder external rotation R mastoid process/ 25 P!  C7  Elbow flexion    Elbow extension    Wrist flexion    Wrist extension    Wrist ulnar deviation    Wrist radial deviation    Wrist pronation    Wrist supination    (Blank rows = not tested, P!= pain )  Notes: upper trap activation throughout all movements, equal pain noted during both Active and passive movement.   UPPER EXTREMITY MMT:  MMT Right eval Left eval  Shoulder flexion 3/5 P! 4/5  Shoulder extension 3/5 P! 4/5  Shoulder abduction 3/5 P! 4/5  Shoulder adduction    Shoulder internal rotation 3/5 P! 4/5  Shoulder external rotation 3/5 P! 4/5  Middle trapezius    Lower trapezius    Elbow flexion    Elbow extension    Wrist flexion    Wrist extension    Wrist ulnar deviation    Wrist radial deviation    Wrist pronation    Wrist supination    Grip strength (lbs)    (Blank rows = not tested)  Notes: pain with all  RUE testing   JOINT MOBILITY TESTING:  Limited assessment due to guarding and pain response.   PALPATION:  TTP along the R upper trap/ levator/ biceps brachii with multiple trigger points noted. Tenderness along the anterior GHJ, AC joint and the coracoid process.                                                                                                                              TREATMENT:  [facility]  Adult PT Treatment:                                                DATE: 10-17-23 Therapeutic Exercise: *** Manual Therapy: *** Neuromuscular re-ed: *** Therapeutic Activity: *** Modalities: *** Self Care: ***  [name] Adult PT Treatment:                                                DATE: 10-08-23 Therapeutic Exercise: Chest press with dowel  1 x 10 Flexion arc in supine -  1 x 10 RTB row - 1x10 RTB ext - 1x10 Scap retraction - 2x10 UT stretch LS stretch Manual Therapy: STW of Right UT/LS, subscapularis and cervical paraspinals and R infrastpinatus Gentle cervical  distraction Trigger Point Dry Needling  Initial Treatment: Pt instructed on Dry Needling rational, procedures, and possible side effects. Pt instructed to expect mild to moderate muscle soreness later in the day and/or into the next day.  Pt instructed in methods to reduce muscle soreness. Pt instructed to continue prescribed HEP. Because Dry Needling was performed over or adjacent to a lung field, pt was educated on S/S of pneumothorax and to seek immediate medical attention should they occur.  Patient was educated on signs and symptoms of infection and other risk factors and advised to seek medical attention should they occur.  Patient verbalized understanding of these instructions and education.   Patient Verbal Consent Given: Yes Education Handout Provided: Yes Muscles Treated: Right UT /LS  Right subscapularis, infraspinatus Electrical Stimulation Performed: No Treatment Response/Outcome: Twitch response and muscle tension release    [facility] Adult PT Treatment  10/02/2023:  Therapeutic Exercise: Chest press with dowel - stopped d/t pain Flexion arc in supine - stopped d/t pain RTB row - 3x10 YTB ext - 3x10 Scap retraction - 2x10 Pulley - 2'  Manual Therapy  AP mob G I - II Gentle ER ROM STM R UT with heat pack applied concurrently  HOME EXERCISE PROGRAM: Access Code: [email or web address] URL: [email or web address] Date: 10/02/2023 Prepared by: Lesleigh Rash  Exercises - Seated Upper Trapezius Stretch  - 1 x daily - 7 x weekly - 2 sets - 3 reps - 30-60 sec hold - Seated Scapular Retraction  - 1 x daily - 7 x weekly - 2 sets - 10 reps - 5 seconds hold - Shoulder Table Slides Flexion  - 2 x daily - 3 sets - 10 reps - 5 hold - Seated Shoulder Scaption Slide at Table Top with Forearm in Neutral  - 1 x daily - 7 x weekly - 2 sets - 10 reps - Standing Shoulder Row with Anchored Resistance  - 1 x daily - 7 x weekly - 3 sets - 10 reps - Shoulder extension with resistance -  Neutral  - 1 x daily - 7 x weekly - 3 sets - 10 reps Added 10-08-23  - Gentle Levator Scapulae Stretch  - 1 x daily - 7 x weekly - 1 sets - 3 reps - 15-30 sec hold   ASSESSMENT:  CLINICAL IMPRESSION:  10/16/2023:  [place] enters clinic with with 9/10 and consents to [facility] and is closely monitored throughout session. Pt with marked twitch response for UT/LS R and infraspinatus. tolerated  session completing 10 x each exercise but no more with no adverse reaction.  She is limited by pain during all therex but improved ot 6/10 at end of session Improvement of motion after TPDN and manual  EVAL: Patient is a 63 y.o. F who was seen today for physical therapy evaluation and treatment for dx of R shoulder rotator cuff tear. Pt demonstrates significant limtation with R shoulder ROM and strength secondary to pain and guarindg. ROM was limited in both AROM/ PROM with guarding present. She responded well to [facility] along the R upper trap noting reduction in upper trap tension/ cervical sidebending. Provided initial HEP to promote PROM movement and being setting the shoulder blade. She would benefit from physical therapy to decrease R shoulder pain, improve AROM/ strength, and maximize her function by addressing the deficits listed.    OBJECTIVE IMPAIRMENTS: decreased activity tolerance, decreased endurance, decreased ROM, decreased strength, increased fascial restrictions, increased muscle spasms, improper body mechanics, postural dysfunction, and pain.   ACTIVITY LIMITATIONS: carrying, lifting, bathing, dressing, and reach over head  PARTICIPATION LIMITATIONS: meal prep, cleaning, laundry, shopping, community activity, occupation, and yard work  PERSONAL FACTORS: Past/current experiences and Time since onset of injury/illness/exacerbation are also affecting patient's functional outcome.   REHAB POTENTIAL: Good  CLINICAL DECISION MAKING: Evolving/moderate complexity  EVALUATION COMPLEXITY:  Moderate  GOALS: Goals reviewed with patient? Yes  SHORT TERM GOALS: Target date: 10/22/2023  Pt to be IND with initial HEP for therapeutic progression Baseline: No previous HEP Goal status: INITIAL  2.  Improve Quickdash score to </= 87.5% to demo improving function Baseline: initial score 93.5% Goal status: INITIAL  3.  Improve R shoulder Flexion/ abduction to </= 100 degrees actively with </= 5/10 max pain.  Baseline: see flow sheet Goal status: INITIAL  4.  Pt to verbalize/ and demonstrate efficient posture reducing upper trap activation to reduce shoulder tension.  Baseline:  upper trap dominate  Goal status: INITIAL   LONG TERM GOALS: Target date: 11/19/2023  Increase R shoulder flexion/ abduction to >/= 120 degrees to maximize with </= 2/10 pain for functional ROM required for ADLs Baseline: see flowsheet Goal status: INITIAL  2.  Improve R shoulder strength to >/= 4/5 to promote shoulder stabulity and assist with lifting / carrying and ADLs associated with work related tasks.  Baseline:  see flow sheet.  Goal status: INITIAL  3.  Improve Quickdash to </= 83 to demonsrtrate improvement based on the MCID to demonstrate improvement in function.  Baseline: initial socre 93.5% Goal status: INITIAL  4.  Patient to be IND with all HEP given and is able to maintain and progress current LOF IND Baseline: No previous HEP Goal status: INITIAL  PLAN: PT FREQUENCY: 1-2x/week  PT DURATION: 6 weeks  PLANNED INTERVENTIONS: 97110-Therapeutic exercises, 97530- Therapeutic activity, 97112- Neuromuscular re-education, 97535- Self Care, 78469- Manual therapy, Taping, Dry Needling, Joint mobilization, Cryotherapy, and Moist heat  PLAN FOR NEXT SESSION: Review/ update HEP PRN. Gentle PROM > AAROM for R GHJ, STW vs DN for the R upper trap/ levator/ bicep brachii, scapular setting and posture education.   ***  For all possible CPT codes, reference the Planned Interventions line  above.     Check all conditions that are expected to impact treatment: {Conditions expected to impact treatment:Musculoskeletal disorders

## 2023-10-17 ENCOUNTER — Ambulatory Visit: Admitting: Physical Therapy

## 2023-10-22 ENCOUNTER — Encounter: Admitting: Physical Therapy

## 2023-10-23 NOTE — Therapy (Signed)
 OUTPATIENT PHYSICAL THERAPY DAILY NOTE   Patient Name: Sally Reid MRN: [id number] DOB:September 27, 1960, 63 y.o., female Today's Date: 10/24/2023  END OF SESSION:  PT End of Session - 10/24/23 1010     Visit Number 4    Number of Visits 13    Date for PT Re-Evaluation 11/19/23    Authorization Type [employer]    Authorization Time Period Approved 6 visits 09/24/23-11/22/23    Authorization - Visit Number 3    Authorization - Number of Visits 6    PT Start Time 1015    PT Stop Time 1100    PT Time Calculation (min) 45 min    Activity Tolerance Patient tolerated treatment well    Behavior During Therapy WFL for tasks assessed/performed               Past Medical History:  Diagnosis Date   Allergy     Arthritis    back/R shoulder   Chronic hepatitis C (HCC)    Fatty liver    GERD (gastroesophageal reflux disease)    Hyperlipidemia    Hypertension    on meds   Past Surgical History:  Procedure Laterality Date   ABDOMINAL HYSTERECTOMY  2005   ANKLE FRACTURE SURGERY Left 2011   CESAREAN SECTION     2 previous   KNEE ARTHROSCOPY Left 2008   for cartilage repair   LEG SURGERY  2011   TUBAL LIGATION     Patient Active Problem List   Diagnosis Date Noted   Hypokalemia 08/16/2023   Rotator cuff tear arthropathy of right shoulder 08/15/2023   Obstipation 08/15/2023   Chronic hepatitis C without hepatic coma (HCC) 04/21/2021   Left leg pain 04/18/2021   History of ankle fracture 04/18/2021   Tobacco use 04/18/2021   Essential hypertension 03/23/2020   Fatty liver 03/23/2020   Morbid obesity with BMI of 45.0-49.9, adult (HCC) 03/23/2020   Multiple thyroid  nodules 07/14/2018    PCP: Joaquin Mulberry, MD  REFERRING PROVIDER: Jasmine Mesi, MD   REFERRING DIAG: Nontraumatic incomplete tear of right rotator cuff [M75.111]   THERAPY DIAG:  Right shoulder pain, unspecified chronicity  Muscle weakness (generalized)  Decreased ROM of right  shoulder  Abnormal posture  Rationale for Evaluation and Treatment: Rehabilitation  ONSET DATE: February 2023  SUBJECTIVE:                                                                                                                                                                                      SUBJECTIVE STATEMENT:  10/24/2023:  Pt reports that her shoulder is about the same.even after TPDN. About 7/10.   I  have been trying to do pretty good with HEP about every other day.  EVAL: The R shoulder started hurting last year, that the pain in the shoulder just gradually got worse. She notes challenges with  lifting/ opening jars. The pain started 10 months ago with no specific onset or cause. Pain is in the front of the shoulder, and has started in the last 2 months going to the neck. She denies any N/T. Noted challenges with manipulating items with the right hands. She reports the pain since onset she notes the pain is worsening.   Hand dominance: Right  PERTINENT HISTORY: See PMHx section  PAIN:  Are you having pain? Yes: NPRS scale: 7/10 at worst pain 10/10 Pain location: R anterior shoulder Pain description: constantly, throbbing, uncomfortable Aggravating factors: any use of the Right hand Relieving factors: medication, rest/ getting out of the painful psotion.   PRECAUTIONS: None  Allergic to Latex  RED FLAGS: None   WEIGHT BEARING RESTRICTIONS: No  FALLS:  Has patient fallen in last 6 months? 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   PLOF: Independent  PATIENT GOALS: For the shoulder to feel better, and move the arm more, gardening and out activities.    OBJECTIVE:  Note: Objective measures were completed at Evaluation unless otherwise noted.  DIAGNOSTIC FINDINGS:  MRI R shoulder  07/08/23 IMPRESSION: 1. Mild supraspinatus tendinosis with a partial-thickness bursal surface tear anteriorly with a small interstitial component extending more posteriorly. 2. Mild infraspinatus tendinosis.  PATIENT SURVEYS :  Quick Dash 93.2/100 = 93.2% 10-24-23  75%  COGNITION: Overall cognitive status: Within functional limits for tasks assessed     SENSATION: Not tested  POSTURE: Forward head posture, anteriorly rolled shoulders.   UPPER EXTREMITY ROM:   Active ROM / Passive Right eval Left eval Right 10-24-23  Shoulder flexion 92 A P! / 90 P! 160 101*  P 121 **  Shoulder extension 17 AP! 58   Shoulder abduction 74 AP!/ 90 P! 122 90 * P 96 *  Shoulder adduction     Shoulder internal rotation Greater trochanter / to stomach P! T8 50 degrees with 90 degree abd  Shoulder external rotation R mastoid process/ 25 P!  C7 80 degrees with 90 degree abd  Elbow flexion     Elbow extension     Wrist flexion     Wrist extension     Wrist ulnar deviation     Wrist radial deviation     Wrist pronation     Wrist supination     (Blank rows = not tested, P!= pain )  Notes: upper trap activation throughout all movements, equal pain noted during both Active and passive movement.   UPPER EXTREMITY MMT:  MMT Right eval Left eval Right 10-24-23  Shoulder flexion 3/5 P! 4/5 3/5 P!  Shoulder extension 3/5 P! 4/5 3/5P!  Shoulder abduction 3/5 P! 4/5 3/5 P!  Shoulder adduction     Shoulder internal rotation 3/5 P! 4/5 3/5P !  Shoulder external rotation 3/5 P! 4/5 3/5P !  Middle trapezius     Lower trapezius     Elbow flexion     Elbow extension     Wrist flexion     Wrist extension     Wrist ulnar deviation     Wrist radial deviation     Wrist pronation  Wrist supination     Grip strength (lbs)     (Blank rows = not tested)  Notes: pain with all  RUE testing   JOINT MOBILITY TESTING:  Limited assessment due to guarding and pain response.   PALPATION:  TTP along the  R upper trap/ levator/ biceps brachii with multiple trigger points noted. Tenderness along the anterior GHJ, AC joint and the coracoid process.                                                                                                                              TREATMENT:  [facility] Adult PT Treatment:                                                DATE: 10-24-23 Therapeutic Exercise: Chest press with dowel  1 x 10 Flexion arc in supine -  1 x 10 RTB row - 1x10 RTB ext - 1x10 Wall flexion with Right and towel  2 x 10 Scap retraction - 2x10 UT stretch LS stretch Manual Therapy: STW and mobilization grade III to pt tolerance  of IR/ER LAD of R UE with increasing shoulder flexion Upper trap massage of Right  Modalities: Moist hot pack between exercises and before STW  [facility] Adult PT Treatment:                                                DATE: 10-08-23 Therapeutic Exercise: Chest press with dowel  1 x 10 Flexion arc in supine -  1 x 10 RTB row - 1x10 RTB ext - 1x10 Scap retraction - 2x10 UT stretch LS stretch Manual Therapy: STW of Right UT/LS, subscapularis and cervical paraspinals and R infrastpinatus Gentle cervical distraction Trigger Point Dry Needling  Initial Treatment: Pt instructed on Dry Needling rational, procedures, and possible side effects. Pt instructed to expect mild to moderate muscle soreness later in the day and/or into the next day.  Pt instructed in methods to reduce muscle soreness. Pt instructed to continue prescribed HEP. Because Dry Needling was performed over or adjacent to a lung field, pt was educated on S/S of pneumothorax and to seek immediate medical attention should they occur.  Patient was educated on signs and symptoms of infection and other risk factors and advised to seek medical attention should they occur.  Patient verbalized understanding of these instructions and education.   Patient Verbal Consent Given: Yes Education Handout  Provided: Yes Muscles Treated: Right UT /LS  Right subscapularis, infraspinatus Electrical Stimulation Performed: No Treatment Response/Outcome: Twitch response and muscle tension release    [facility] Adult PT Treatment  10/02/2023:  Therapeutic Exercise: Chest press with dowel - stopped d/t pain  Flexion arc in supine - stopped d/t pain RTB row - 3x10 YTB ext - 3x10 Scap retraction - 2x10 Pulley - 2'  Manual Therapy  AP mob G I - II Gentle ER ROM STM R UT with heat pack applied concurrently  HOME EXERCISE PROGRAM: Access Code: [email or web address] URL: [email or web address] Date: 10/02/2023 Prepared by: Lesleigh Rash  Exercises - Seated Upper Trapezius Stretch  - 1 x daily - 7 x weekly - 2 sets - 3 reps - 30-60 sec hold - Seated Scapular Retraction  - 1 x daily - 7 x weekly - 2 sets - 10 reps - 5 seconds hold - Shoulder Table Slides Flexion  - 2 x daily - 3 sets - 10 reps - 5 hold - Seated Shoulder Scaption Slide at Table Top with Forearm in Neutral  - 1 x daily - 7 x weekly - 2 sets - 10 reps - Standing Shoulder Row with Anchored Resistance  - 1 x daily - 7 x weekly - 3 sets - 10 reps - Shoulder extension with resistance - Neutral  - 1 x daily - 7 x weekly - 3 sets - 10 reps Added 10-08-23  - Gentle Levator Scapulae Stretch  - 1 x daily - 7 x weekly - 1 sets - 3 reps - 15-30 sec hold Added 10-24-23 - Shoulder Flexion Wall Slide with Towel  - 1 x daily - 7 x weekly - 1 sets - 10 reps   ASSESSMENT:  CLINICAL IMPRESSION:  10/24/2023:  Dlynn enters clinic with with 9/10 and pain is not better with TPDN and declined TPDN. Pt has two more visits with PT but may call MD to reevaluate since pain not alleviated with current regimen. Pt states she is compliant every other day with HEP and tries to avoid due to pain.  She is trying to use heat and ice but mostly heat as needed.  She is limited by pain during all therex.  Pt Goals improved and Quick DASH improved from 90's to 75% and  is able to increase AROM but is still quite limited by pain.  She also is allergic to latex and could not use inhibition taping after STW of Right Upper trap STW.  Pt may benefit from injection and was advised to call MD for further evaluation.   EVAL: Patient is a 63 y.o. F who was seen today for physical therapy evaluation and treatment for dx of R shoulder rotator cuff tear. Pt demonstrates significant limtation with R shoulder ROM and strength secondary to pain and guarindg. ROM was limited in both AROM/ PROM with guarding present. She responded well to [facility] along the R upper trap noting reduction in upper trap tension/ cervical sidebending. Provided initial HEP to promote PROM movement and being setting the shoulder blade. She would benefit from physical therapy to decrease R shoulder pain, improve AROM/ strength, and maximize her function by addressing the deficits listed.    OBJECTIVE IMPAIRMENTS: decreased activity tolerance, decreased endurance, decreased ROM, decreased strength, increased fascial restrictions, increased muscle spasms, improper body mechanics, postural dysfunction, and pain.   ACTIVITY LIMITATIONS: carrying, lifting, bathing, dressing, and reach over head  PARTICIPATION LIMITATIONS: meal prep, cleaning, laundry, shopping, community activity, occupation, and yard work  PERSONAL FACTORS: Past/current experiences and Time since onset of injury/illness/exacerbation are also affecting patient's functional outcome.   REHAB POTENTIAL: Good  CLINICAL DECISION MAKING: Evolving/moderate complexity  EVALUATION COMPLEXITY: Moderate  GOALS: Goals reviewed with patient? Yes  SHORT TERM GOALS: Target date: 10/22/2023  Pt to be IND with initial HEP for therapeutic progression Baseline: No previous HEP Goal status: MET 10-24-23 every other day compliant  2.  Improve Quickdash score to </= 87.5% to demo improving function Baseline: initial score 93.5% 10-24-23  75% Goal  status:MET  3.  Improve R shoulder Flexion/ abduction to </= 100 degrees actively with </= 5/10 max pain.  Baseline: see flow sheet 10-24-23 See flow sheet Goal status Partially Met  4.  Pt to verbalize/ and demonstrate efficient posture reducing upper trap activation to reduce shoulder tension.  Baseline:  upper trap dominate  10-24-23  Pt upper trap spasming Goal status: ONGOING   LONG TERM GOALS: Target date: 11/19/2023  Increase R shoulder flexion/ abduction to >/= 120 degrees to maximize with </= 2/10 pain for functional ROM required for ADLs Baseline: see flowsheet Goal status: INITIAL  2.  Improve R shoulder strength to >/= 4/5 to promote shoulder stabulity and assist with lifting / carrying and ADLs associated with work related tasks.  Baseline:  see flow sheet.  Goal status: INITIAL  3.  Improve Quickdash to </= 83 to demonsrtrate improvement based on the MCID to demonstrate improvement in function.  Baseline: initial socre 93.5% 10-24-23  75% Goal status:MET  4.  Patient to be IND with all HEP given and is able to maintain and progress current LOF IND Baseline: No previous HEP Goal status: INITIAL  PLAN: PT FREQUENCY: 1-2x/week  PT DURATION: 6 weeks  PLANNED INTERVENTIONS: 97110-Therapeutic exercises, 97530- Therapeutic activity, 97112- Neuromuscular re-education, 97535- Self Care, 21308- Manual therapy, Taping, Dry Needling, Joint mobilization, Cryotherapy, and Moist heat  PLAN FOR NEXT SESSION: Review/ update HEP PRN. Gentle PROM > AAROM for R GHJ, STW vs DN for the R upper trap/ levator/ bicep brachii, scapular setting and posture education.    Sharlet Dawson, PT, [employer] Certified Exercise Expert for the Aging Adult  10/24/23 12:53 PM Phone: [phone] Fax: [phone]    For all possible CPT codes, reference the Planned Interventions line above.     Check all conditions that are expected to impact treatment: {Conditions expected to impact  treatment:Musculoskeletal disorders

## 2023-10-24 ENCOUNTER — Encounter: Admitting: Physical Therapy

## 2023-10-24 ENCOUNTER — Ambulatory Visit: Admitting: Physical Therapy

## 2023-10-24 ENCOUNTER — Encounter: Payer: Self-pay | Admitting: Physical Therapy

## 2023-10-24 DIAGNOSIS — M6281 Muscle weakness (generalized): Secondary | ICD-10-CM

## 2023-10-24 DIAGNOSIS — M25511 Pain in right shoulder: Secondary | ICD-10-CM

## 2023-10-24 DIAGNOSIS — M25611 Stiffness of right shoulder, not elsewhere classified: Secondary | ICD-10-CM

## 2023-10-24 DIAGNOSIS — R293 Abnormal posture: Secondary | ICD-10-CM

## 2023-10-28 ENCOUNTER — Ambulatory Visit: Admission: EM | Admit: 2023-10-28 | Discharge: 2023-10-28 | Disposition: A | Discharge status: Home / Self Care

## 2023-10-28 ENCOUNTER — Emergency Department (HOSPITAL_COMMUNITY)
Admission: EM | Admit: 2023-10-28 | Discharge: 2023-10-28 | Disposition: A | Discharge status: Home / Self Care | Attending: Emergency Medicine | Admitting: Emergency Medicine

## 2023-10-28 ENCOUNTER — Other Ambulatory Visit: Payer: Self-pay

## 2023-10-28 ENCOUNTER — Encounter: Payer: Self-pay | Admitting: *Deleted

## 2023-10-28 ENCOUNTER — Emergency Department (HOSPITAL_COMMUNITY)

## 2023-10-28 DIAGNOSIS — I2510 Atherosclerotic heart disease of native coronary artery without angina pectoris: Secondary | ICD-10-CM | POA: Diagnosis not present

## 2023-10-28 DIAGNOSIS — S39011A Strain of muscle, fascia and tendon of abdomen, initial encounter: Secondary | ICD-10-CM | POA: Diagnosis not present

## 2023-10-28 DIAGNOSIS — Z91040 Latex allergy status: Secondary | ICD-10-CM | POA: Insufficient documentation

## 2023-10-28 DIAGNOSIS — T148XXA Other injury of unspecified body region, initial encounter: Secondary | ICD-10-CM

## 2023-10-28 DIAGNOSIS — M546 Pain in thoracic spine: Secondary | ICD-10-CM | POA: Diagnosis present

## 2023-10-28 DIAGNOSIS — E876 Hypokalemia: Secondary | ICD-10-CM | POA: Diagnosis not present

## 2023-10-28 DIAGNOSIS — X58XXXA Exposure to other specified factors, initial encounter: Secondary | ICD-10-CM | POA: Diagnosis not present

## 2023-10-28 DIAGNOSIS — R600 Localized edema: Secondary | ICD-10-CM | POA: Insufficient documentation

## 2023-10-28 DIAGNOSIS — R079 Chest pain, unspecified: Secondary | ICD-10-CM

## 2023-10-28 DIAGNOSIS — I10 Essential (primary) hypertension: Secondary | ICD-10-CM | POA: Insufficient documentation

## 2023-10-28 LAB — CBC WITH DIFFERENTIAL/PLATELET
Abs Immature Granulocytes: 0.02 10*3/uL (ref 0.00–0.07)
Basophils Absolute: 0 10*3/uL (ref 0.0–0.1)
Basophils Relative: 0 %
Eosinophils Absolute: 0.1 10*3/uL (ref 0.0–0.5)
Eosinophils Relative: 1 %
HCT: 39.6 % (ref 36.0–46.0)
Hemoglobin: 13 g/dL (ref 12.0–15.0)
Immature Granulocytes: 0 %
Lymphocytes Relative: 52 %
Lymphs Abs: 4.2 10*3/uL — ABNORMAL HIGH (ref 0.7–4.0)
MCH: 28.3 pg (ref 26.0–34.0)
MCHC: 32.8 g/dL (ref 30.0–36.0)
MCV: 86.1 fL (ref 80.0–100.0)
Monocytes Absolute: 0.7 10*3/uL (ref 0.1–1.0)
Monocytes Relative: 8 %
Neutro Abs: 3.1 10*3/uL (ref 1.7–7.7)
Neutrophils Relative %: 39 %
Platelets: 219 10*3/uL (ref 150–400)
RBC: 4.6 MIL/uL (ref 3.87–5.11)
RDW: 14.1 % (ref 11.5–15.5)
WBC: 8.1 10*3/uL (ref 4.0–10.5)
nRBC: 0 % (ref 0.0–0.2)

## 2023-10-28 LAB — TROPONIN I (HIGH SENSITIVITY)
Troponin I (High Sensitivity): 8 ng/L (ref <18)
Troponin I (High Sensitivity): 13 ng/L (ref <18)

## 2023-10-28 LAB — URINALYSIS, ROUTINE W REFLEX MICROSCOPIC
Bilirubin Urine: NEGATIVE
Glucose, UA: NEGATIVE mg/dL
Hgb urine dipstick: NEGATIVE
Ketones, ur: NEGATIVE mg/dL
Nitrite: NEGATIVE
Protein, ur: NEGATIVE mg/dL
Specific Gravity, Urine: 1.017 (ref 1.005–1.030)
pH: 5 (ref 5.0–8.0)

## 2023-10-28 LAB — COMPREHENSIVE METABOLIC PANEL WITH GFR
ALT: 14 U/L (ref 0–44)
AST: 18 U/L (ref 15–41)
Albumin: 3.4 g/dL — ABNORMAL LOW (ref 3.5–5.0)
Alkaline Phosphatase: 59 U/L (ref 38–126)
Anion gap: 9 (ref 5–15)
BUN: 16 mg/dL (ref 8–23)
CO2: 25 mmol/L (ref 22–32)
Calcium: 9.3 mg/dL (ref 8.9–10.3)
Chloride: 102 mmol/L (ref 98–111)
Creatinine, Ser: 0.9 mg/dL (ref 0.44–1.00)
GFR, Estimated: >60 mL/min (ref >60)
Glucose, Bld: 86 mg/dL (ref 70–99)
Potassium: 3.3 mmol/L — ABNORMAL LOW (ref 3.5–5.1)
Sodium: 136 mmol/L (ref 135–145)
Total Bilirubin: 0.9 mg/dL (ref 0.0–1.2)
Total Protein: 7 g/dL (ref 6.5–8.1)

## 2023-10-28 LAB — D-DIMER, QUANTITATIVE: D-Dimer, Quant: 1.3 ug{FEU}/mL — ABNORMAL HIGH (ref 0.00–0.50)

## 2023-10-28 LAB — LIPASE, BLOOD: Lipase: 29 U/L (ref 11–51)

## 2023-10-28 LAB — URINALYSIS, MICROSCOPIC (REFLEX)

## 2023-10-28 MED ORDER — IOHEXOL 350 MG/ML SOLN
100.0000 mL | Freq: Once | INTRAVENOUS | Status: AC | PRN
  Administered 2023-10-28: 100 mL via INTRAVENOUS

## 2023-10-28 MED ORDER — OXYCODONE-ACETAMINOPHEN 5-325 MG PO TABS
1.0000 | ORAL_TABLET | Freq: Once | ORAL | Status: AC
  Administered 2023-10-28: 1 via ORAL
  Filled 2023-10-28: qty 1

## 2023-10-28 MED ORDER — LIDOCAINE 5 % EX PTCH: 1.0000 | MEDICATED_PATCH | CUTANEOUS | 0 refills | Status: DC

## 2023-10-28 MED ORDER — POTASSIUM CHLORIDE CRYS ER 20 MEQ PO TBCR
40.0000 meq | EXTENDED_RELEASE_TABLET | Freq: Once | ORAL | Status: AC
  Administered 2023-10-28: 40 meq via ORAL
  Filled 2023-10-28: qty 2

## 2023-10-28 NOTE — Discharge Instructions (Addendum)
 Please follow-up with your primary care provider in regards to recent symptoms and ER visit.  Today your labs and imaging ultimately reassuring and you may have a muscle strain from starting PT.  You may take Tylenol  every 6 hours needed for pain.  Please use ice 3 times daily for up to 15 minutes.  Please rest and if symptoms change or worsen please return to the ER.

## 2023-10-28 NOTE — ED Triage Notes (Signed)
 Patient c/o back pain x 2 days. Patient report back pain is radiating to her mid lower chest. Patient denies SOB. Patient denies N/V.

## 2023-10-28 NOTE — ED Triage Notes (Signed)
 Upper back pain since yesterday- States pain wraps around to her right chest. Drove to beach 2 weeks ago. Endorses feeling SOB "been like this since yesterday morning".

## 2023-10-28 NOTE — ED Provider Notes (Signed)
 [place] EMERGENCY DEPARTMENT AT [facility] Provider Note   CSN: [id number] Arrival date & time: 10/28/23  1801     History  Chief Complaint  Patient presents with   Back Pain   Chest Pain    Sally Reid is a 63 y.o. female history of chronic otitis CAD, GERD, fatty liver, hypertension, hyperlipidemia presented for right upper back pain that radiates around to her right upper quadrant and chest pain shortness of breath.  Patient states she recently went to the beach 2 weeks ago [facility] and started having symptoms yesterday.  Patient has any cardiac respiratory history, hemoptysis, leg swelling.  Patient denies any nausea vomiting.  Patient denies any fevers.  Patient has any blood in her urine.  Patient's been injury without issue.  Patient states that she did start PT recently but does not feel this is a muscle strain but does state it gets worse with movement.  Home Medications Prior to Admission medications   Medication Sig Start Date End Date Taking? Authorizing Provider  lidocaine  (LIDODERM ) 5 % Place 1 patch onto the skin daily. Remove & Discard patch within 12 hours or as directed by MD 10/28/23  Yes Maurya Nethery, Arlin Benes, PA-C  acetaminophen  (TYLENOL ) 500 MG tablet Take 500 mg by mouth daily.    [provider]  albuterol  (VENTOLIN  HFA) 108 (90 Base) MCG/ACT inhaler Inhale 2 puffs into the lungs every 6 (six) hours as needed for wheezing or shortness of breath.    [provider]  APPLE CIDER VINEGAR PO Take 450 mg by mouth daily.    [provider]  atorvastatin  (LIPITOR) 20 MG tablet Take 1 tablet (20 mg total) by mouth daily. 08/17/23   Vernell Goldsmith, MD  chlorthalidone  (HYGROTON ) 25 MG tablet Take 1 tablet (25 mg total) by mouth daily. 08/15/23   Vernell Goldsmith, MD  clobetasol  cream (TEMOVATE ) 0.05 % Apply 1 Application topically 2 (two) times daily. To affected area on left forearm Patient taking differently: Apply 1  Application topically 2 (two) times daily as needed (rash). To affected area on left forearm 09/18/22   Vernell Goldsmith, MD  diclofenac  Sodium (VOLTAREN  ARTHRITIS PAIN) 1 % GEL Apply 2 g topically 4 (four) times daily as needed (shoulder pain).    [provider]  potassium chloride  SA (KLOR-CON  M) 20 MEQ tablet Take 20 mEq by mouth every other day.    [provider]  traMADol  (ULTRAM ) 50 MG tablet Take 50 mg by mouth every 6 (six) hours as needed for moderate pain (pain score 4-6).    [provider]      Allergies    Latex    Review of Systems   Review of Systems  Cardiovascular:  Positive for chest pain.  Musculoskeletal:  Positive for back pain.    Physical Exam Updated Vital Signs BP (!) 134/56   Pulse (!) 58   Temp 98.2 F (36.8 C)   Resp 16   SpO2 100%  Physical Exam Vitals reviewed.  Constitutional:      General: She is not in acute distress. HENT:     Head: Normocephalic and atraumatic.  Eyes:     Extraocular Movements: Extraocular movements intact.     Conjunctiva/sclera: Conjunctivae normal.     Pupils: Pupils are equal, round, and reactive to light.  Cardiovascular:     Rate and Rhythm: Normal rate and regular rhythm.     Pulses: Normal pulses.     Heart  sounds: Normal heart sounds.     Comments: 2+ bilateral radial/dorsalis pedis pulses with regular rate Pulmonary:     Effort: Pulmonary effort is normal. No respiratory distress.     Breath sounds: Normal breath sounds.  Abdominal:     Palpations: Abdomen is soft.     Tenderness: There is abdominal tenderness (Right upper quadrant). There is no right CVA tenderness, left CVA tenderness, guarding or rebound.  Musculoskeletal:        General: Normal range of motion.     Cervical back: Normal range of motion and neck supple.     Comments: 5 out of 5 bilateral grip/leg extension strength Nonpitting edema present bilaterally patient states is chronic  Skin:    General: Skin is warm  and dry.     Capillary Refill: Capillary refill takes less than 2 seconds.     Findings: No rash.  Neurological:     General: No focal deficit present.     Mental Status: She is alert and oriented to person, place, and time.     Comments: Sensation intact in all 4 limbs  Psychiatric:        Mood and Affect: Mood normal.     ED Results / Procedures / Treatments   Labs (all labs ordered are listed, but only abnormal results are displayed) Labs Reviewed  COMPREHENSIVE METABOLIC PANEL WITH GFR - Abnormal; Notable for the following components:      Result Value   Potassium 3.3 (*)    Albumin 3.4 (*)    All other components within normal limits  CBC WITH DIFFERENTIAL/PLATELET - Abnormal; Notable for the following components:   Lymphs Abs 4.2 (*)    All other components within normal limits  D-DIMER, QUANTITATIVE - Abnormal; Notable for the following components:   D-Dimer, Quant 1.30 (*)    All other components within normal limits  URINALYSIS, ROUTINE W REFLEX MICROSCOPIC - Abnormal; Notable for the following components:   Color, Urine YELLOW (*)    Leukocytes,Ua FEW (*)    All other components within normal limits  LIPASE, BLOOD  TROPONIN I (HIGH SENSITIVITY)  TROPONIN I (HIGH SENSITIVITY)    EKG EKG Interpretation Date/Time:  Monday Oct 28 2023 18:09:05 EDT Ventricular Rate:  56 PR Interval:  124 QRS Duration:  88 QT Interval:  443 QTC Calculation: 428 R Axis:   59  Text Interpretation: Sinus rhythm Abnormal R-wave progression, early transition Confirmed by Iva Mariner ([id number]) on 10/28/2023 6:42:55 PM  Radiology CT Angio Chest PE W/Cm &/Or Wo Cm Result Date: 10/28/2023 CLINICAL DATA:  Chest pain and back pain. Right upper quadrant pain. PE suspected. EXAM: CT ANGIOGRAPHY CHEST CT ABDOMEN AND PELVIS WITH CONTRAST TECHNIQUE: Multidetector CT imaging of the chest was performed using the standard protocol during bolus administration of intravenous contrast. Multiplanar CT image  reconstructions and MIPs were obtained to evaluate the vascular anatomy. Multidetector CT imaging of the abdomen and pelvis was performed using the standard protocol during bolus administration of intravenous contrast. RADIATION DOSE REDUCTION: This exam was performed according to the departmental dose-optimization program which includes automated exposure control, adjustment of the mA and/or kV according to patient size and/or use of iterative reconstruction technique. CONTRAST:  OMNIPAQUE  IOHEXOL  350 MG/ML SOLN COMPARISON:  Same day chest radiograph; CTA chest 04/30/2022; CT abdomen pelvis 03/14/2020 FINDINGS: CTA CHEST FINDINGS Cardiovascular: No pericardial effusion. No acute pulmonary embolism. Normal caliber thoracic aorta. No evidence of dissection. Mediastinum/Nodes: Trachea and esophagus are unremarkable. No thoracic adenopathy.  Lungs/Pleura: No focal consolidation, pleural effusion, or pneumothorax. Musculoskeletal: No acute fracture. Review of the MIP images confirms the above findings. CT ABDOMEN and PELVIS FINDINGS Hepatobiliary: Liver, gallbladder, and biliary tree are unremarkable. Pancreas: Unremarkable. Spleen: Unremarkable. Adrenals/Urinary Tract: Normal adrenal glands. No urinary calculi or hydronephrosis. Unremarkable bladder. Stomach/Bowel: Normal caliber large and small bowel. No bowel wall thickening stomach is within normal limits. Vascular/Lymphatic: No significant vascular findings are present. No enlarged abdominal or pelvic lymph nodes. Reproductive: Hysterectomy.  No adnexal mass. Other: No free intraperitoneal fluid or air. Musculoskeletal: No acute fracture Review of the MIP images confirms the above findings. IMPRESSION: No acute abnormality in the chest, abdomen, or pelvis. Electronically Signed   By: Rozell Cornet M.D.   On: 10/28/2023 21:14   CT ABDOMEN PELVIS W CONTRAST Result Date: 10/28/2023 CLINICAL DATA:  Chest pain and back pain. Right upper quadrant pain. PE  suspected. EXAM: CT ANGIOGRAPHY CHEST CT ABDOMEN AND PELVIS WITH CONTRAST TECHNIQUE: Multidetector CT imaging of the chest was performed using the standard protocol during bolus administration of intravenous contrast. Multiplanar CT image reconstructions and MIPs were obtained to evaluate the vascular anatomy. Multidetector CT imaging of the abdomen and pelvis was performed using the standard protocol during bolus administration of intravenous contrast. RADIATION DOSE REDUCTION: This exam was performed according to the departmental dose-optimization program which includes automated exposure control, adjustment of the mA and/or kV according to patient size and/or use of iterative reconstruction technique. CONTRAST:  OMNIPAQUE  IOHEXOL  350 MG/ML SOLN COMPARISON:  Same day chest radiograph; CTA chest 04/30/2022; CT abdomen pelvis 03/14/2020 FINDINGS: CTA CHEST FINDINGS Cardiovascular: No pericardial effusion. No acute pulmonary embolism. Normal caliber thoracic aorta. No evidence of dissection. Mediastinum/Nodes: Trachea and esophagus are unremarkable. No thoracic adenopathy. Lungs/Pleura: No focal consolidation, pleural effusion, or pneumothorax. Musculoskeletal: No acute fracture. Review of the MIP images confirms the above findings. CT ABDOMEN and PELVIS FINDINGS Hepatobiliary: Liver, gallbladder, and biliary tree are unremarkable. Pancreas: Unremarkable. Spleen: Unremarkable. Adrenals/Urinary Tract: Normal adrenal glands. No urinary calculi or hydronephrosis. Unremarkable bladder. Stomach/Bowel: Normal caliber large and small bowel. No bowel wall thickening stomach is within normal limits. Vascular/Lymphatic: No significant vascular findings are present. No enlarged abdominal or pelvic lymph nodes. Reproductive: Hysterectomy.  No adnexal mass. Other: No free intraperitoneal fluid or air. Musculoskeletal: No acute fracture Review of the MIP images confirms the above findings. IMPRESSION: No acute abnormality in  the chest, abdomen, or pelvis. Electronically Signed   By: Rozell Cornet M.D.   On: 10/28/2023 21:14   DG Chest Port 1 View Result Date: 10/28/2023 CLINICAL DATA:  Chest pain.  Back pain. EXAM: PORTABLE CHEST 1 VIEW COMPARISON:  05/05/2023 FINDINGS: The cardiomediastinal contours are normal. The lungs are clear. Pulmonary vasculature is normal. No consolidation, pleural effusion, or pneumothorax. No acute osseous abnormalities are seen. IMPRESSION: No active disease. Electronically Signed   By: Chadwick Colonel M.D.   On: 10/28/2023 18:54    Procedures Procedures    Medications Ordered in ED Medications  oxyCODONE -acetaminophen  (PERCOCET/ROXICET) 5-325 MG per tablet 1 tablet (1 tablet Oral Given 10/28/23 1833)  iohexol  (OMNIPAQUE ) 350 MG/ML injection 100 mL (100 mLs Intravenous Contrast Given 10/28/23 2046)  potassium chloride  SA (KLOR-CON  M) CR tablet 40 mEq (40 mEq Oral Given 10/28/23 2134)    ED Course/ Medical Decision Making/ A&P  Medical Decision Making Amount and/or Complexity of Data Reviewed Labs: ordered. Radiology: ordered.  Risk Prescription drug management.   Sally Reid 63 y.o. presented today for chest pain. Working DDx that I considered at this time includes, but not limited to, ACS, pulmonary embolism, community-acquired pneumonia, aortic dissection, pneumothorax, underlying bony abnormality, anemia, thyrotoxicosis, HTN urgency/emergency, esophageal rupture, CHF exacerbation, valvular disorder, myocarditis, pericarditis, endocarditis, pericardial effusion/cardiac tamponade, pulmonary edema, gastritis/PUD/GERD, esophagitis, MSK.  R/o Dx: ACS, pulmonary embolism, community-acquired pneumonia, aortic dissection, pneumothorax, underlying bony abnormality, anemia, thyrotoxicosis, HTN urgency/emergency, esophageal rupture, CHF exacerbation, valvular disorder, myocarditis, pericarditis, endocarditis, pericardial effusion/cardiac tamponade,  pulmonary edema, gastritis/PUD/GERD, esophagitis: These are considered less likely due to history of present illness and physical exam findings. PE: CTA negative  Aortic Dissection: less likely based on the location, quality, onset, and severity of symptoms in this case. Patient also has a lack of underlying history of AD or TAA.   Review of prior external notes: 06/02/2020 clinical support urgent care  Unique Tests and My Interpretation:  EKG: Rate, rhythm, axis, intervals all examined and without medically relevant abnormality. ST segments without concerns for elevations Troponin: 8 CXR: No acute pathology CBC: Unremarkable CMP: Unremarkable Lipase: Unremarkable D-dimer: Elevated 1.3 UA: Unremarkable  Social Determinants of Health: none  Discussion with Independent Historian: None  Discussion of Management of Tests: None  Risk: Medium: prescription drug management  Risk Stratification Score: None  Plan: On exam patient was in no acute distress stable vitals. Patient's physical was remarkable for right upper quadrant tenderness without peritoneal signs. Labs and CXR will be ordered.  The cardiac monitor was ordered secondary to the patient's history of chest pain and to monitor the patient for dysrhythmia. The cardiac monitor revealed normal sinus as interpreted by me. Patient stable at this time.  Patient is no CVA tenderness or rash noted in the area.  Bedside ultrasound of the gallbladder and kidneys did not show any acute abnormalities.  Patient does have recent travel history and so D-dimer was ordered along with chest pain and abdominal pain labs.  Pain meds were provided.  Labs do show elevated D-dimer so we will get CTA chest rule out PE along with CT abdomen pelvis with contrast as patient was having right upper quadrant pain with me.  Patient's delta troponin and CT imaging were all negative.  Patient did start PT and so at this time as we have ruled out other  life-threatening pathology do believe patient has muscle strain from PT and will have her follow-up with her primary care provider.  I spoke with her about using Tylenol  every 6 hours needed for pain and will prescribe lidocaine  patch and spoke about using ice as well.  Patient was given return precautions. Patient stable for discharge at this time.  Patient verbalized understanding of plan.  This chart was dictated using voice recognition software.  Despite best efforts to proofread,  errors can occur which can change the documentation meaning.        Final Clinical Impression(s) / ED Diagnoses Final diagnoses:  Muscle strain  Hypokalemia    Rx / DC Orders ED Discharge Orders          Ordered    lidocaine  (LIDODERM ) 5 %  Every 24 hours        10/28/23 2145              Elex Grimmer 10/28/23 2146    Iva Mariner, MD 10/28/23 [phone]

## 2023-10-28 NOTE — ED Provider Notes (Signed)
 Patient presents with significant right-sided chest pain that wraps to her back.  She reports she did drive to the beach 2 weeks ago but states shortness of breath and pain started yesterday morning.  She notes movement also worsens pain.  She denies any lightheadedness or dizziness.  She has not had any nausea or vomiting. Recommended further evaluation in the ED- patient is stable and will transport via POV.    Vernestine Gondola, PA-C 10/28/23 1743

## 2023-10-28 NOTE — ED Notes (Signed)
 Patient is being discharged from the Urgent Care and sent to the Emergency Department via pov . Per Jami Mcclintock, PA-C, patient is in need of higher level of care due to chest and back pain. Patient is aware and verbalizes understanding of plan of care.  Vitals:   10/28/23 1735  BP: 131/83  Pulse: 60  Resp: 18  Temp: 98 F (36.7 C)  SpO2: 99%

## 2023-10-29 ENCOUNTER — Other Ambulatory Visit: Payer: Self-pay | Admitting: Family

## 2023-10-30 NOTE — Therapy (Unsigned)
 OUTPATIENT PHYSICAL THERAPY DAILY NOTE   Patient Name: Sally Reid MRN: [id number] DOB:Sep 04, 1960, 63 y.o., female Today's Date: 10/31/2023  END OF SESSION:  PT End of Session - 10/31/23 1017     Visit Number 5    Number of Visits 13    Date for PT Re-Evaluation 11/19/23    Authorization Type [employer]    Authorization Time Period Approved 6 visits 09/24/23-11/22/23    Authorization - Visit Number 4    Authorization - Number of Visits 6    PT Start Time 1017    PT Stop Time 1100    PT Time Calculation (min) 43 min    Activity Tolerance Patient tolerated treatment well    Behavior During Therapy WFL for tasks assessed/performed                Past Medical History:  Diagnosis Date   Allergy     Arthritis    back/R shoulder   Chronic hepatitis C (HCC)    Fatty liver    GERD (gastroesophageal reflux disease)    Hyperlipidemia    Hypertension    on meds   Past Surgical History:  Procedure Laterality Date   ABDOMINAL HYSTERECTOMY  2005   ANKLE FRACTURE SURGERY Left 2011   CESAREAN SECTION     2 previous   KNEE ARTHROSCOPY Left 2008   for cartilage repair   LEG SURGERY  2011   TUBAL LIGATION     Patient Active Problem List   Diagnosis Date Noted   Hypokalemia 08/16/2023   Rotator cuff tear arthropathy of right shoulder 08/15/2023   Obstipation 08/15/2023   Chronic hepatitis C without hepatic coma (HCC) 04/21/2021   Left leg pain 04/18/2021   History of ankle fracture 04/18/2021   Tobacco use 04/18/2021   Essential hypertension 03/23/2020   Fatty liver 03/23/2020   Morbid obesity with BMI of 45.0-49.9, adult (HCC) 03/23/2020   Multiple thyroid  nodules 07/14/2018    PCP: Joaquin Mulberry, MD  REFERRING PROVIDER: Jasmine Mesi, MD   REFERRING DIAG: Nontraumatic incomplete tear of right rotator cuff [M75.111]   THERAPY DIAG:  Right shoulder pain, unspecified chronicity  Muscle weakness (generalized)  Decreased ROM of right  shoulder  Abnormal posture  Rationale for Evaluation and Treatment: Rehabilitation  ONSET DATE: February 2023  SUBJECTIVE:                                                                                                                                                                                      SUBJECTIVE STATEMENT:  10-31-23: Pt reports that she had pain back that went to chest and was in ED and  was cleared to return to normal activities but is being followed up by primary MD.  Pt enters clinic with 7-9/10 pain. Pt has tried to use lidocaine  and cream for pain with little relief.  Dr Julio Ohm gave me a shot and then he sent me to Dr Rozelle Corning.    EVAL: The R shoulder started hurting last year, that the pain in the shoulder just gradually got worse. She notes challenges with  lifting/ opening jars. The pain started 10 months ago with no specific onset or cause. Pain is in the front of the shoulder, and has started in the last 2 months going to the neck. She denies any N/T. Noted challenges with manipulating items with the right hands. She reports the pain since onset she notes the pain is worsening.   Hand dominance: Right  PERTINENT HISTORY: See PMHx section  PAIN:  Are you having pain? Yes: NPRS scale: 7/10 at worst pain 10/10 Pain location: R anterior shoulder Pain description: constantly, throbbing, uncomfortable Aggravating factors: any use of the Right hand Relieving factors: medication, rest/ getting out of the painful psotion.   PRECAUTIONS: None  Allergic to Latex  RED FLAGS: None   WEIGHT BEARING RESTRICTIONS: No  FALLS:  Has patient fallen in last 6 months? 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   PLOF: Independent  PATIENT GOALS: For the shoulder to feel better, and move the arm more, gardening and out  activities.    OBJECTIVE:  Note: Objective measures were completed at Evaluation unless otherwise noted.  DIAGNOSTIC FINDINGS:  MRI R shoulder 07/08/23 IMPRESSION: 1. Mild supraspinatus tendinosis with a partial-thickness bursal surface tear anteriorly with a small interstitial component extending more posteriorly. 2. Mild infraspinatus tendinosis.  PATIENT SURVEYS :  Quick Dash 93.2/100 = 93.2% 10-24-23  75%  COGNITION: Overall cognitive status: Within functional limits for tasks assessed     SENSATION: Not tested  POSTURE: Forward head posture, anteriorly rolled shoulders.   UPPER EXTREMITY ROM:   Active ROM / Passive Right eval Left eval Right 10-24-23  Shoulder flexion 92 A P! / 90 P! 160 101*  P 121 **  Shoulder extension 17 AP! 58   Shoulder abduction 74 AP!/ 90 P! 122 90 * P 96 *  Shoulder adduction     Shoulder internal rotation Greater trochanter / to stomach P! T8 50 degrees with 90 degree abd  Shoulder external rotation R mastoid process/ 25 P!  C7 80 degrees with 90 degree abd  Elbow flexion     Elbow extension     Wrist flexion     Wrist extension     Wrist ulnar deviation     Wrist radial deviation     Wrist pronation     Wrist supination     (Blank rows = not tested, P!= pain )  Notes: upper trap activation throughout all movements, equal pain noted during both Active and passive movement.   UPPER EXTREMITY MMT:  MMT Right eval Left eval Right 10-24-23  Shoulder flexion 3/5 P! 4/5 3/5 P!  Shoulder extension 3/5 P! 4/5 3/5P!  Shoulder abduction 3/5 P! 4/5 3/5 P!  Shoulder adduction     Shoulder internal rotation 3/5 P! 4/5 3/5P !  Shoulder external rotation 3/5 P! 4/5 3/5P !  Middle trapezius     Lower trapezius     Elbow  flexion     Elbow extension     Wrist flexion     Wrist extension     Wrist ulnar deviation     Wrist radial deviation     Wrist pronation     Wrist supination     Grip strength (lbs)     (Blank rows = not  tested)  Notes: pain with all  RUE testing   JOINT MOBILITY TESTING:  Limited assessment due to guarding and pain response.   PALPATION:  TTP along the R upper trap/ levator/ biceps brachii with multiple trigger points noted. Tenderness along the anterior GHJ, AC joint and the coracoid process.                                                                                                                              TREATMENT:   [facility] Adult PT Treatment:                                                DATE: 10-31-23 Therapeutic Exercise: Chest press with dowel  1 x 10 1/2 range pain 7/10 One arm serratus press. RTB row - 1x10 RTB ext - 1x10 Wall flexion with Right and towel  2 x 10 Scap retraction - 2x10 UT stretch LS stretch Manual Therapy: STW and mobilization grade III to pt tolerance of IR/ER Left 1st rib mob gentle grade ll LAD of R UE with increasing shoulder flexion Upper trap massage of Right  [facility] Adult PT Treatment:                                                DATE: 10-24-23 Therapeutic Exercise: Chest press with dowel  1 x 10 Flexion arc in supine -  1 x 10 RTB row - 1x10 RTB ext - 1x10 Wall flexion with Right and towel  2 x 10 Scap retraction - 2x10 UT stretch LS stretch Manual Therapy: STW and mobilization grade III to pt tolerance  of IR/ER LAD of R UE with increasing shoulder flexion Upper trap massage of Right  Modalities: Moist hot pack between exercises and before STW  [facility] Adult PT Treatment:                                                DATE: 10-08-23 Therapeutic Exercise: Chest press with dowel  1 x 10 Flexion arc in supine -  1 x 10 RTB row - 1x10 RTB ext - 1x10 Scap retraction - 2x10 UT stretch LS stretch Manual Therapy: STW of Right UT/LS,  subscapularis and cervical paraspinals and R infrastpinatus Gentle cervical distraction Trigger Point Dry Needling  Initial Treatment: Pt instructed on Dry Needling rational, procedures, and  possible side effects. Pt instructed to expect mild to moderate muscle soreness later in the day and/or into the next day.  Pt instructed in methods to reduce muscle soreness. Pt instructed to continue prescribed HEP. Because Dry Needling was performed over or adjacent to a lung field, pt was educated on S/S of pneumothorax and to seek immediate medical attention should they occur.  Patient was educated on signs and symptoms of infection and other risk factors and advised to seek medical attention should they occur.  Patient verbalized understanding of these instructions and education.   Patient Verbal Consent Given: Yes Education Handout Provided: Yes Muscles Treated: Right UT /LS  Right subscapularis, infraspinatus Electrical Stimulation Performed: No Treatment Response/Outcome: Twitch response and muscle tension release    [facility] Adult PT Treatment  10/02/2023:  Therapeutic Exercise: Chest press with dowel - stopped d/t pain Flexion arc in supine - stopped d/t pain RTB row - 3x10 YTB ext - 3x10 Scap retraction - 2x10 Pulley - 2'  Manual Therapy  AP mob G I - II Gentle ER ROM STM R UT with heat pack applied concurrently  HOME EXERCISE PROGRAM: Access Code: [email or web address] URL: [email or web address] Date: 10/02/2023 Prepared by: Lesleigh Rash  Exercises - Seated Upper Trapezius Stretch  - 1 x daily - 7 x weekly - 2 sets - 3 reps - 30-60 sec hold - Seated Scapular Retraction  - 1 x daily - 7 x weekly - 2 sets - 10 reps - 5 seconds hold - Shoulder Table Slides Flexion  - 2 x daily - 3 sets - 10 reps - 5 hold - Seated Shoulder Scaption Slide at Table Top with Forearm in Neutral  - 1 x daily - 7 x weekly - 2 sets - 10 reps - Standing Shoulder Row with Anchored Resistance  - 1 x daily - 7 x weekly - 3 sets - 10 reps - Shoulder extension with resistance - Neutral  - 1 x daily - 7 x weekly - 3 sets - 10 reps Added 10-08-23  - Gentle Levator Scapulae Stretch  - 1 x daily -  7 x weekly - 1 sets - 3 reps - 15-30 sec hold Added 10-24-23 - Shoulder Flexion Wall Slide with Towel  - 1 x daily - 7 x weekly - 1 sets - 10 reps   ASSESSMENT:  CLINICAL IMPRESSION:  10/31/2023:  Sally Reid enters clinic with with 7-9/10 and pain is not better with TPDN and declined TPDN. Pt has onemore visit with PT and was advised to call MD to reevaluate since pain not alleviated with current regimen. Pt states she is compliant every other day with HEP and tries to avoid due to pain.  She is trying to use heat and ice but mostly heat as needed.  She is limited by pain during all therex.  Pt may benefit from injection/ reevaluation.  Pt also reports that she had received an injection by Dr Julio Ohm without any relief. Pt was advised to call MD for further evaluation.   EVAL: Patient is a 62 y.o. F who was seen today for physical therapy evaluation and treatment for dx of R shoulder rotator cuff tear. Pt demonstrates significant limtation with R shoulder ROM and strength secondary to pain and guarindg. ROM was limited in both AROM/ PROM with guarding present. She responded well to [facility] along  the R upper trap noting reduction in upper trap tension/ cervical sidebending. Provided initial HEP to promote PROM movement and being setting the shoulder blade. She would benefit from physical therapy to decrease R shoulder pain, improve AROM/ strength, and maximize her function by addressing the deficits listed.    OBJECTIVE IMPAIRMENTS: decreased activity tolerance, decreased endurance, decreased ROM, decreased strength, increased fascial restrictions, increased muscle spasms, improper body mechanics, postural dysfunction, and pain.   ACTIVITY LIMITATIONS: carrying, lifting, bathing, dressing, and reach over head  PARTICIPATION LIMITATIONS: meal prep, cleaning, laundry, shopping, community activity, occupation, and yard work  PERSONAL FACTORS: Past/current experiences and Time since onset of  injury/illness/exacerbation are also affecting patient's functional outcome.   REHAB POTENTIAL: Good  CLINICAL DECISION MAKING: Evolving/moderate complexity  EVALUATION COMPLEXITY: Moderate  GOALS: Goals reviewed with patient? Yes  SHORT TERM GOALS: Target date: 10/22/2023  Pt to be IND with initial HEP for therapeutic progression Baseline: No previous HEP Goal status: MET 10-24-23 every other day compliant  2.  Improve Quickdash score to </= 87.5% to demo improving function Baseline: initial score 93.5% 10-24-23  75% Goal status:MET  3.  Improve R shoulder Flexion/ abduction to </= 100 degrees actively with </= 5/10 max pain.  Baseline: see flow sheet 10-24-23 See flow sheet Goal status Partially Met  4.  Pt to verbalize/ and demonstrate efficient posture reducing upper trap activation to reduce shoulder tension.  Baseline:  upper trap dominate  10-24-23  Pt upper trap spasming Goal status: ONGOING   LONG TERM GOALS: Target date: 11/19/2023  Increase R shoulder flexion/ abduction to >/= 120 degrees to maximize with </= 2/10 pain for functional ROM required for ADLs Baseline: see flowsheet Goal status: INITIAL  2.  Improve R shoulder strength to >/= 4/5 to promote shoulder stabulity and assist with lifting / carrying and ADLs associated with work related tasks.  Baseline:  see flow sheet.  Goal status: INITIAL  3.  Improve Quickdash to </= 83 to demonsrtrate improvement based on the MCID to demonstrate improvement in function.  Baseline: initial socre 93.5% 10-24-23  75% Goal status:MET  4.  Patient to be IND with all HEP given and is able to maintain and progress current LOF IND Baseline: No previous HEP Goal status: INITIAL  PLAN: PT FREQUENCY: 1-2x/week  PT DURATION: 6 weeks  PLANNED INTERVENTIONS: 97110-Therapeutic exercises, 97530- Therapeutic activity, 97112- Neuromuscular re-education, 97535- Self Care, 82956- Manual therapy, Taping, Dry Needling, Joint  mobilization, Cryotherapy, and Moist heat  PLAN FOR NEXT SESSION: Review/ update HEP PRN. Gentle PROM > AAROM for R GHJ, STW vs DN for the R upper trap/ levator/ bicep brachii, scapular setting and posture education.    Sharlet Dawson, PT, [facility] Certified Exercise Expert for the Aging Adult  10/31/23 11:27 AM Phone: [phone] Fax: [phone]    For all possible CPT codes, reference the Planned Interventions line above.     Check all conditions that are expected to impact treatment: {Conditions expected to impact treatment:Musculoskeletal disorders

## 2023-10-31 ENCOUNTER — Encounter: Payer: Self-pay | Admitting: Physical Therapy

## 2023-10-31 ENCOUNTER — Ambulatory Visit: Admitting: Physical Therapy

## 2023-10-31 DIAGNOSIS — R293 Abnormal posture: Secondary | ICD-10-CM

## 2023-10-31 DIAGNOSIS — M25511 Pain in right shoulder: Secondary | ICD-10-CM

## 2023-10-31 DIAGNOSIS — M25611 Stiffness of right shoulder, not elsewhere classified: Secondary | ICD-10-CM

## 2023-10-31 DIAGNOSIS — M6281 Muscle weakness (generalized): Secondary | ICD-10-CM

## 2023-11-04 ENCOUNTER — Telehealth (INDEPENDENT_AMBULATORY_CARE_PROVIDER_SITE_OTHER): Payer: Self-pay | Admitting: Primary Care

## 2023-11-04 NOTE — Telephone Encounter (Signed)
 Called pt to confirm appt. Pt will be present.

## 2023-11-06 NOTE — Therapy (Addendum)
 OUTPATIENT PHYSICAL THERAPY DAILY NOTE/DISCHARGE NOTE PHYSICAL THERAPY DISCHARGE SUMMARY  Visits from Start of Care: 6  Current functional level related to goals / functional outcomes: As indicated below.  Pt unable to complete all goals due to pain, increasing weakness and increasing tingling in R UE   Remaining deficits: As indicated below.  Increasing weakness,  Grip strength R UE dominant 7lb, L UE 42 lb,  pain 8-10/10, weakness noted in progress note   Education / Equipment: HEP  self care, posture, positioning. Pain management   Patient agrees to discharge. Patient goals were partially met. Patient is being discharged due to deferred to MD to conduct re evaluation and proceed as he deems medically necessary Pt unable to complete PT due to increasing weakness, pain and unable to complete exercises/continue PT. Pt agrees with DC and will pursue MD for further evaluation..    Patient Name: Sally Reid MRN: [id number] DOB:1960/09/19, 63 y.o., female Today's Date: 11/07/2023  END OF SESSION:  PT End of Session - 11/07/23 1105     Visit Number 6    Number of Visits 13    Date for PT Re-Evaluation 11/19/23    Authorization Type [employer]    Authorization Time Period Approved 6 visits 09/24/23-11/22/23    Authorization - Visit Number 5    Authorization - Number of Visits 6    PT Start Time 1105    PT Stop Time 1145    PT Time Calculation (min) 40 min    Activity Tolerance Patient tolerated treatment well    Behavior During Therapy WFL for tasks assessed/performed                 Past Medical History:  Diagnosis Date   Allergy     Arthritis    back/R shoulder   Chronic hepatitis C (HCC)    Fatty liver    GERD (gastroesophageal reflux disease)    Hyperlipidemia    Hypertension    on meds   Past Surgical History:  Procedure Laterality Date   ABDOMINAL HYSTERECTOMY  2005   ANKLE FRACTURE SURGERY Left 2011   CESAREAN SECTION     2 previous    KNEE ARTHROSCOPY Left 2008   for cartilage repair   LEG SURGERY  2011   TUBAL LIGATION     Patient Active Problem List   Diagnosis Date Noted   Hypokalemia 08/16/2023   Rotator cuff tear arthropathy of right shoulder 08/15/2023   Obstipation 08/15/2023   Chronic hepatitis C without hepatic coma (HCC) 04/21/2021   Left leg pain 04/18/2021   History of ankle fracture 04/18/2021   Tobacco use 04/18/2021   Essential hypertension 03/23/2020   Fatty liver 03/23/2020   Morbid obesity with BMI of 45.0-49.9, adult (HCC) 03/23/2020   Multiple thyroid  nodules 07/14/2018    PCP: Joaquin Mulberry, MD  REFERRING PROVIDER: Jasmine Mesi, MD   REFERRING DIAG: Nontraumatic incomplete tear of right rotator cuff [M75.111]   THERAPY DIAG:  Right shoulder pain, unspecified chronicity  Muscle weakness (generalized)  Decreased ROM of right shoulder  Rationale for Evaluation and Treatment: Rehabilitation  ONSET DATE: February 2023  SUBJECTIVE:  SUBJECTIVE STATEMENT:  11-07-23 :  Pt enters clinic with 8/10 pain. Pt has tried to use lidocaine  and cream for pain with little relief. Pt is feeling more weak in her Right UE and having more difficulty reaching for items, unable to wash her back and feels like she has decreased grip strength.   EVAL: The R shoulder started hurting last year, that the pain in the shoulder just gradually got worse. She notes challenges with  lifting/ opening jars. The pain started 10 months ago with no specific onset or cause. Pain is in the front of the shoulder, and has started in the last 2 months going to the neck. She denies any N/T. Noted challenges with manipulating items with the right hands. She reports the pain since onset she notes the pain is worsening.   Hand dominance:  Right  PERTINENT HISTORY: See PMHx section  PAIN:  Are you having pain? Yes: NPRS scale: 7/10 at worst pain 10/10 Pain location: R anterior shoulder Pain description: constantly, throbbing, uncomfortable Aggravating factors: any use of the Right hand Relieving factors: medication, rest/ getting out of the painful psotion.   PRECAUTIONS: None  Allergic to Latex  RED FLAGS: None   WEIGHT BEARING RESTRICTIONS: No  FALLS:  Has patient fallen in last 6 months? 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   PLOF: Independent  PATIENT GOALS: For the shoulder to feel better, and move the arm more, gardening and out activities.    OBJECTIVE:  Note: Objective measures were completed at Evaluation unless otherwise noted.  DIAGNOSTIC FINDINGS:  MRI R shoulder 07/08/23 IMPRESSION: 1. Mild supraspinatus tendinosis with a partial-thickness bursal surface tear anteriorly with a small interstitial component extending more posteriorly. 2. Mild infraspinatus tendinosis.  PATIENT SURVEYS :  Cindia Crease 93.2/100 = 93.2% 10-24-23  75% 11-07-23  83.3%    COGNITION: Overall cognitive status: Within functional limits for tasks assessed     SENSATION: Not tested  POSTURE: Forward head posture, anteriorly rolled shoulders.   UPPER EXTREMITY ROM:   Active ROM / Passive Right eval Left eval Right 10-24-23 Right 11-07-23  Shoulder flexion 92 A P! / 90 P! 160 101*  P 121 ** 85 AROM PROM 120 *  Shoulder extension 17 AP! 58    Shoulder abduction 74 AP!/ 90 P! 122 90 * P 96 * 90 PROM 96  Shoulder adduction      Shoulder internal rotation Greater trochanter / to stomach P! T8 50 degrees with 90 degree abd 42 degrees with 90 abduction  Shoulder external rotation R mastoid process/ 25 P!  C7 80 degrees with 90 degree abd 70 degrees with 90  degrees abd  Elbow flexion      Elbow extension      Wrist flexion      Wrist extension      Wrist ulnar deviation      Wrist radial deviation      Wrist pronation      Wrist supination      (Blank rows = not tested, P!= pain )  Notes: upper trap activation throughout all movements, equal pain noted during both Active and passive movement.   UPPER EXTREMITY MMT:  MMT Right eval Left eval Right 10-24-23 R 6-5 -25  Shoulder flexion 3/5 P! 4/5 3/5 P! 3/5 P!  Shoulder extension 3/5 P! 4/5 3/5P! 3/5  P!  Shoulder abduction 3/5 P! 4/5 3/5 P! 3/5 P!  Shoulder adduction      Shoulder internal rotation 3/5 P! 4/5 3/5P ! 3/5 P!  Shoulder external rotation 3/5 P! 4/5 3/5P ! 3/5 P!  Middle trapezius      Lower trapezius      Elbow flexion      Elbow extension      Wrist flexion      Wrist extension      Wrist ulnar deviation      Wrist radial deviation      Wrist pronation      Wrist supination      Grip strength (lbs)      (Blank rows = not tested)  Notes: pain with all  RUE testing   JOINT MOBILITY TESTING:  Limited assessment due to guarding and pain response.   PALPATION:  TTP along the R upper trap/ levator/ biceps brachii with multiple trigger points noted. Tenderness along the anterior GHJ, AC joint and the coracoid process.                                                                                                                              TREATMENT:  [facility] Adult PT Treatment:                                                DATE: 11-07-23 Quick Dash 83.3% worse than last taken Therapeutic Exercise: Physio ball Green on table  flexion and abduction Chest press with dowel  1 x 10 1/2 range pain 8-9/10 One arm serratus press. RTB row - 1x8 RTB ext - 1x8  limited by pain Reviewed HEP  Self Care: Pt educated on care for arm and sleep positions to decrease pain Pain management strategies and precautions with increased neurological symptoms and decreasing  strength Posture   [facility] Adult PT Treatment:                                                DATE: 10-31-23 Therapeutic Exercise: Chest press with dowel  1 x 10 1/2 range pain 7/10 One arm serratus press. RTB row - 1x10 RTB ext - 1x10 Wall flexion with Right and towel  2 x 10 Scap retraction - 2x10 UT stretch LS stretch Manual Therapy: STW and mobilization grade III to pt tolerance of IR/ER Left 1st rib mob gentle grade ll LAD of R UE with increasing shoulder flexion Upper trap massage of Right  [facility] Adult PT Treatment:  DATE: 10-24-23 Therapeutic Exercise: Chest press with dowel  1 x 10 Flexion arc in supine -  1 x 10 RTB row - 1x10 RTB ext - 1x10 Wall flexion with Right and towel  2 x 10 Scap retraction - 2x10 UT stretch LS stretch Manual Therapy: STW and mobilization grade III to pt tolerance  of IR/ER LAD of R UE with increasing shoulder flexion Upper trap massage of Right  Modalities: Moist hot pack between exercises and before STW  [facility] Adult PT Treatment:                                                DATE: 10-08-23 Therapeutic Exercise: Chest press with dowel  1 x 10 Flexion arc in supine -  1 x 10 RTB row - 1x10 RTB ext - 1x10 Scap retraction - 2x10 UT stretch LS stretch Manual Therapy: STW of Right UT/LS, subscapularis and cervical paraspinals and R infrastpinatus Gentle cervical distraction Trigger Point Dry Needling  Initial Treatment: Pt instructed on Dry Needling rational, procedures, and possible side effects. Pt instructed to expect mild to moderate muscle soreness later in the day and/or into the next day.  Pt instructed in methods to reduce muscle soreness. Pt instructed to continue prescribed HEP. Because Dry Needling was performed over or adjacent to a lung field, pt was educated on S/S of pneumothorax and to seek immediate medical attention should they occur.  Patient was educated on signs and  symptoms of infection and other risk factors and advised to seek medical attention should they occur.  Patient verbalized understanding of these instructions and education.   Patient Verbal Consent Given: Yes Education Handout Provided: Yes Muscles Treated: Right UT /LS  Right subscapularis, infraspinatus Electrical Stimulation Performed: No Treatment Response/Outcome: Twitch response and muscle tension release    [facility] Adult PT Treatment  10/02/2023:  Therapeutic Exercise: Chest press with dowel - stopped d/t pain Flexion arc in supine - stopped d/t pain RTB row - 3x10 YTB ext - 3x10 Scap retraction - 2x10 Pulley - 2'  Manual Therapy  AP mob G I - II Gentle ER ROM STM R UT with heat pack applied concurrently  HOME EXERCISE PROGRAM: Access Code: [email or web address] URL: [email or web address] Date: 10/02/2023 Prepared by: Lesleigh Rash  Exercises - Seated Upper Trapezius Stretch  - 1 x daily - 7 x weekly - 2 sets - 3 reps - 30-60 sec hold - Seated Scapular Retraction  - 1 x daily - 7 x weekly - 2 sets - 10 reps - 5 seconds hold - Shoulder Table Slides Flexion  - 2 x daily - 3 sets - 10 reps - 5 hold - Seated Shoulder Scaption Slide at Table Top with Forearm in Neutral  - 1 x daily - 7 x weekly - 2 sets - 10 reps - Standing Shoulder Row with Anchored Resistance  - 1 x daily - 7 x weekly - 3 sets - 10 reps - Shoulder extension with resistance - Neutral  - 1 x daily - 7 x weekly - 3 sets - 10 reps Added 10-08-23  - Gentle Levator Scapulae Stretch  - 1 x daily - 7 x weekly - 1 sets - 3 reps - 15-30 sec hold Added 10-24-23 - Shoulder Flexion Wall Slide with Towel  - 1 x daily - 7 x weekly - 1 sets - 10  reps   ASSESSMENT:  CLINICAL IMPRESSION:  11/07/2023:  Amberly enters clinic with with 8/10 and pain is not better TPDN or exercise and pt is having difficulty with any reaching or use of her R UE.  Pt has trouble with fastening seat belt and she notes more difficulty with  opening jars, notices her grip strength is decreased R UE ( dominant) 7 lb and L UE 42 lb.  Quick DAsh and increased with severity of symptoms and pain to 83.3% from 75 % last taken. Pt is having difficulty with gripping, sleeping, reaching for items and is unable to wash back and take care of personal needs due to pain.  Pt is unable to fasten seat belt.  Pt symptoms worse than on evaluation.  Pt was advised to call MD to reevaluate since she has also had difficulty even completing exercises given on HEP due to pain and weakness. She is limited by pain during all therex.  Pt may benefit from injection/ reevaluation. PT LTG # 1 and 2 NOT MET due to pain,  #3 MET and # 4 partially MET largely due to pain,weakness and increasing tingling in R UE. Pt agrees to be discharged and deferred to MD for further work up.  EVAL: Patient is a 63 y.o. F who was seen today for physical therapy evaluation and treatment for dx of R shoulder rotator cuff tear. Pt demonstrates significant limtation with R shoulder ROM and strength secondary to pain and guarindg. ROM was limited in both AROM/ PROM with guarding present. She responded well to [facility] along the R upper trap noting reduction in upper trap tension/ cervical sidebending. Provided initial HEP to promote PROM movement and being setting the shoulder blade. She would benefit from physical therapy to decrease R shoulder pain, improve AROM/ strength, and maximize her function by addressing the deficits listed.    OBJECTIVE IMPAIRMENTS: decreased activity tolerance, decreased endurance, decreased ROM, decreased strength, increased fascial restrictions, increased muscle spasms, improper body mechanics, postural dysfunction, and pain.   ACTIVITY LIMITATIONS: carrying, lifting, bathing, dressing, and reach over head  PARTICIPATION LIMITATIONS: meal prep, cleaning, laundry, shopping, community activity, occupation, and yard work  PERSONAL FACTORS: Past/current experiences and  Time since onset of injury/illness/exacerbation are also affecting patient's functional outcome.   REHAB POTENTIAL: Good  CLINICAL DECISION MAKING: Evolving/moderate complexity  EVALUATION COMPLEXITY: Moderate  GOALS: Goals reviewed with patient? Yes  SHORT TERM GOALS: Target date: 10/22/2023  Pt to be IND with initial HEP for therapeutic progression Baseline: No previous HEP Goal status: MET 10-24-23 every other day compliant  2.  Improve Quickdash score to </= 87.5% to demo improving function Baseline: initial score 93.5% 10-24-23  75% Goal status:MET  3.  Improve R shoulder Flexion/ abduction to </= 100 degrees actively with </= 5/10 max pain.  Baseline: see flow sheet 10-24-23 See flow sheet Goal status Partially Met  4.  Pt to verbalize/ and demonstrate efficient posture reducing upper trap activation to reduce shoulder tension.  Baseline:  upper trap dominate  10-24-23  Pt upper trap spasming Goal status: ONGOING   LONG TERM GOALS: Target date: 11/19/2023  Increase R shoulder flexion/ abduction to >/= 120 degrees to maximize with </= 2/10 pain for functional ROM required for ADLs Baseline: see flowsheet 11-07-23  Pt pain at 8/10 and unable to complete due to weakness and Pain Goal status: NOT MET  2.  Improve R shoulder strength to >/= 4/5 to promote shoulder stabulity and assist with lifting / carrying  and ADLs associated with work related tasks.  Baseline:  see flow sheet.  11-07-23 Pt impaired with pain and weakness Goal status: NOT MET  3.  Improve Quickdash to </= 83 to demonsrtrate improvement based on the MCID to demonstrate improvement in function.  Baseline: initial socre 93.5% 10-24-23  75% 11-07-23  83.3% Goal status:MET  4.  Patient to be IND with all HEP given and is able to maintain and progress current LOF IND Baseline: No previous HEP 6-5 25 pt is I but too painful to comply Goal status:Partially MET due to pain 11-07-23  PLAN: PT FREQUENCY:  1-2x/week  PT DURATION: 6 weeks  PLANNED INTERVENTIONS: 97110-Therapeutic exercises, 97530- Therapeutic activity, 97112- Neuromuscular re-education, 97535- Self Care, 10272- Manual therapy, Taping, Dry Needling, Joint mobilization, Cryotherapy, and Moist heat  PLAN FOR NEXT SESSION: Review/ update HEP PRN. Gentle PROM > AAROM for R GHJ, STW vs DN for the R upper trap/ levator/ bicep brachii, scapular setting and posture education.    Sharlet Dawson, PT, [facility] Certified Exercise Expert for the Aging Adult  11/07/23 12:44 PM Phone: [phone] Fax: [phone]    For all possible CPT codes, reference the Planned Interventions line above.     Check all conditions that are expected to impact treatment: {Conditions expected to impact treatment:Musculoskeletal disorders      Sharlet Dawson, PT, [facility] Certified Exercise Expert for the Aging Adult  11/12/23 2:59 PM Phone: [phone] Fax: [phone]

## 2023-11-07 ENCOUNTER — Ambulatory Visit: Attending: Orthopedic Surgery | Admitting: Physical Therapy

## 2023-11-07 ENCOUNTER — Encounter: Payer: Self-pay | Admitting: Physical Therapy

## 2023-11-07 DIAGNOSIS — M6281 Muscle weakness (generalized): Secondary | ICD-10-CM | POA: Diagnosis present

## 2023-11-07 DIAGNOSIS — M25511 Pain in right shoulder: Secondary | ICD-10-CM | POA: Insufficient documentation

## 2023-11-07 DIAGNOSIS — M25611 Stiffness of right shoulder, not elsewhere classified: Secondary | ICD-10-CM | POA: Insufficient documentation

## 2023-11-11 ENCOUNTER — Inpatient Hospital Stay (INDEPENDENT_AMBULATORY_CARE_PROVIDER_SITE_OTHER): Admitting: Primary Care

## 2023-11-13 ENCOUNTER — Telehealth (INDEPENDENT_AMBULATORY_CARE_PROVIDER_SITE_OTHER): Payer: Self-pay | Admitting: Primary Care

## 2023-11-13 NOTE — Telephone Encounter (Signed)
 Called pt to reschedule pt appt. Pt was not able to reschedule at this time. Pt stated she would call back to reschedule.

## 2023-12-20 ENCOUNTER — Telehealth: Payer: Self-pay

## 2023-12-20 ENCOUNTER — Other Ambulatory Visit: Payer: Self-pay | Admitting: Family Medicine

## 2023-12-20 NOTE — Telephone Encounter (Signed)
 Error

## 2024-02-17 ENCOUNTER — Ambulatory Visit: Attending: Family Medicine | Admitting: Family Medicine

## 2024-02-17 ENCOUNTER — Encounter: Payer: Self-pay | Admitting: Family Medicine

## 2024-02-17 VITALS — BP 128/78 | HR 62 | Ht 59.0 in | Wt 214.4 lb

## 2024-02-17 DIAGNOSIS — R7303 Prediabetes: Secondary | ICD-10-CM | POA: Diagnosis not present

## 2024-02-17 DIAGNOSIS — Z6841 Body Mass Index (BMI) 40.0 and over, adult: Secondary | ICD-10-CM

## 2024-02-17 DIAGNOSIS — N950 Postmenopausal bleeding: Secondary | ICD-10-CM | POA: Diagnosis not present

## 2024-02-17 DIAGNOSIS — I10 Essential (primary) hypertension: Secondary | ICD-10-CM | POA: Diagnosis not present

## 2024-02-17 DIAGNOSIS — Z23 Encounter for immunization: Secondary | ICD-10-CM

## 2024-02-17 DIAGNOSIS — R21 Rash and other nonspecific skin eruption: Secondary | ICD-10-CM | POA: Diagnosis not present

## 2024-02-17 DIAGNOSIS — E785 Hyperlipidemia, unspecified: Secondary | ICD-10-CM

## 2024-02-17 DIAGNOSIS — Z79899 Other long term (current) drug therapy: Secondary | ICD-10-CM

## 2024-02-17 DIAGNOSIS — F17210 Nicotine dependence, cigarettes, uncomplicated: Secondary | ICD-10-CM

## 2024-02-17 DIAGNOSIS — E876 Hypokalemia: Secondary | ICD-10-CM

## 2024-02-17 LAB — POCT GLYCOSYLATED HEMOGLOBIN (HGB A1C): HbA1c, POC (prediabetic range): 5.7 % (ref 5.7–6.4)

## 2024-02-17 MED ORDER — ATORVASTATIN CALCIUM 20 MG PO TABS: 20.0000 mg | ORAL_TABLET | Freq: Every day | ORAL | 3 refills | Status: AC

## 2024-02-17 MED ORDER — CHLORTHALIDONE 25 MG PO TABS: 25.0000 mg | ORAL_TABLET | Freq: Every day | ORAL | 3 refills | Status: AC

## 2024-02-17 MED ORDER — POTASSIUM CHLORIDE CRYS ER 20 MEQ PO TBCR: 20.0000 meq | EXTENDED_RELEASE_TABLET | Freq: Every day | ORAL | 1 refills | Status: AC

## 2024-02-17 NOTE — Patient Instructions (Addendum)

## 2024-02-17 NOTE — Progress Notes (Signed)
 Subjective:  Patient ID: Sally Reid, female    DOB: 1961/05/22  Age: 63 y.o. MRN: [id number]  CC: Medical Management of Chronic Issues (Discuss weight loss injection/Black spots on body/Discuss pap smear)     Discussed the use of AI scribe software for clinical note transcription with the patient, who gave verbal consent to proceed.  History of Present Illness Sally Reid is a 63 year old female with a history of hypertension, hyperlipidemia who presents with postmenopausal bleeding and skin changes.  She experienced a one-time episode of postmenopausal bleeding, uncertain if it originated from the vagina or vulva, but she believes it was vaginal. She has achieved menopause and notes that this was a singular occurrence.  Brown spots have been present on her body for about a year, sparing the face, and do not itch. She has not consulted a dermatologist recently.  She is interested in weight loss options, specifically weight loss injections, and is awaiting current diabetes screening results to determine medication eligibility.  She has a history of low potassium, previously managed with potassium pills, which she has not been taking due to a prescription issue. She takes chlorthalidone  for blood pressure and atorvastatin  for cholesterol. She experiences daily swelling in her ankles and feet and has a history of left ankle and leg surgery.  She bruises easily from minor pressure or bumps and does not use aspirin  or blood thinners.    Past Medical History:  Diagnosis Date   Allergy     Arthritis    back/R shoulder   Chronic hepatitis C (HCC)    Fatty liver    GERD (gastroesophageal reflux disease)    Hyperlipidemia    Hypertension    on meds    Past Surgical History:  Procedure Laterality Date   ABDOMINAL HYSTERECTOMY  2005   ANKLE FRACTURE SURGERY Left 2011   CESAREAN SECTION     2 previous   KNEE ARTHROSCOPY Left 2008   for cartilage repair   LEG SURGERY  2011    TUBAL LIGATION      Family History  Problem Relation Age of Onset   Stomach cancer Mother 4   Diabetes Mother    Hypertension Mother    Stomach cancer Brother    Hodgkin's lymphoma Brother 10   Colon polyps Neg Hx    Colon cancer Neg Hx    Esophageal cancer Neg Hx    Rectal cancer Neg Hx     Social History   Socioeconomic History   Marital status: Married    Spouse name: Not on file   Number of children: Not on file   Years of education: Not on file   Highest education level: Not on file  Occupational History   Occupation: unemployed    Comment: without health insurance  Tobacco Use   Smoking status: Some Days    Current packs/day: 0.10    Types: Cigarettes   Smokeless tobacco: Never   Tobacco comments:    Smokes 2 cigarettes/day - cutting back  Vaping Use   Vaping status: Never Used  Substance and Sexual Activity   Alcohol use: Yes    Alcohol/week: 3.0 standard drinks of alcohol    Types: 3 Standard drinks or equivalent per week    Comment: occassionally   Drug use: No   Sexual activity: Yes    Birth control/protection: Surgical  Other Topics Concern   Not on file  Social History Narrative   Lives in [place].   Had 2 children( two  boys 27 and 29).   Living with fiancee.   Social Drivers of Corporate investment banker Strain: Low Risk  (08/15/2023)   Overall Financial Resource Strain (CARDIA)    Difficulty of Paying Living Expenses: Not very hard  Food Insecurity: No Food Insecurity (08/15/2023)   Hunger Vital Sign    Worried About Running Out of Food in the Last Year: Never true    Ran Out of Food in the Last Year: Never true  Transportation Needs: No Transportation Needs (08/15/2023)   PRAPARE - Administrator, Civil Service (Medical): No    Lack of Transportation (Non-Medical): No  Physical Activity: Insufficiently Active (08/15/2023)   Exercise Vital Sign    Days of Exercise per Week: 1 day    Minutes of Exercise per Session: 30 min   Stress: Stress Concern Present (08/15/2023)   [employer] of Occupational Health - Occupational Stress Questionnaire    Feeling of Stress : Rather much  Social Connections: Moderately Integrated (08/15/2023)   Social Connection and Isolation Panel    Frequency of Communication with Friends and Family: More than three times a week    Frequency of Social Gatherings with Friends and Family: Three times a week    Attends Religious Services: More than 4 times per year    Active Member of Clubs or Organizations: Yes    Attends Banker Meetings: 1 to 4 times per year    Marital Status: Separated    Allergies  Allergen Reactions   Latex Rash    On bandages    Outpatient Medications Prior to Visit  Medication Sig Dispense Refill   acetaminophen  (TYLENOL ) 500 MG tablet Take 500 mg by mouth daily.     albuterol  (VENTOLIN  HFA) 108 (90 Base) MCG/ACT inhaler Inhale 2 puffs into the lungs every 6 (six) hours as needed for wheezing or shortness of breath.     APPLE CIDER VINEGAR PO Take 450 mg by mouth daily.     clobetasol  cream (TEMOVATE ) 0.05 % Apply 1 Application topically 2 (two) times daily. To affected area on left forearm (Patient taking differently: Apply 1 Application topically 2 (two) times daily as needed (rash). To affected area on left forearm) 60 g 1   diclofenac  Sodium (VOLTAREN  ARTHRITIS PAIN) 1 % GEL Apply 2 g topically 4 (four) times daily as needed (shoulder pain).     lidocaine  (LIDODERM ) 5 % Place 1 patch onto the skin daily. Remove & Discard patch within 12 hours or as directed by MD 30 patch 0   traMADol  (ULTRAM ) 50 MG tablet TAKE 1 TABLET BY MOUTH EVERY 6 HOURS AS NEEDED FOR MODERATE PAIN 10 tablet 0   atorvastatin  (LIPITOR) 20 MG tablet Take 1 tablet (20 mg total) by mouth daily. 90 tablet 3   chlorthalidone  (HYGROTON ) 25 MG tablet Take 1 tablet (25 mg total) by mouth daily. 90 tablet 4   potassium chloride  SA (KLOR-CON  M) 20 MEQ tablet Take 20 mEq by mouth  every other day.     No facility-administered medications prior to visit.     ROS Review of Systems  Constitutional:  Negative for activity change and appetite change.  HENT:  Negative for sinus pressure and sore throat.   Respiratory:  Negative for chest tightness, shortness of breath and wheezing.   Cardiovascular:  Positive for leg swelling. Negative for chest pain and palpitations.  Gastrointestinal:  Negative for abdominal distention, abdominal pain and constipation.  Genitourinary: Negative.   Musculoskeletal:  Negative.   Skin:  Positive for rash.  Hematological:  Bruises/bleeds easily.  Psychiatric/Behavioral:  Negative for behavioral problems and dysphoric mood.     Objective:  BP 128/78   Pulse 62   Ht 4' 11 (1.499 m)   Wt 214 lb 6.4 oz (97.3 kg)   SpO2 99%   BMI 43.30 kg/m      02/17/2024    2:20 PM 10/28/2023    9:30 PM 10/28/2023    9:10 PM  BP/Weight  Systolic BP 128 134 148  Diastolic BP 78 56 75  Wt. (Lbs) 214.4    BMI 43.3 kg/m2        Physical Exam Constitutional:      Appearance: She is well-developed.  Cardiovascular:     Rate and Rhythm: Normal rate.     Heart sounds: Normal heart sounds. No murmur heard. Pulmonary:     Effort: Pulmonary effort is normal.     Breath sounds: Normal breath sounds. No wheezing or rales.  Chest:     Chest wall: No tenderness.  Abdominal:     General: Bowel sounds are normal. There is no distension.     Palpations: Abdomen is soft. There is no mass.     Tenderness: There is no abdominal tenderness.  Musculoskeletal:        General: Normal range of motion.     Right lower leg: No edema.     Left lower leg: Edema (1+ non pitting) present.  Skin:    Comments: PIn point hyperpigmented lesions on extremities, trunk but sparing the face  Neurological:     Mental Status: She is alert and oriented to person, place, and time.  Psychiatric:        Mood and Affect: Mood normal.        Latest Ref Rng & Units  10/28/2023    6:30 PM 09/10/2023    2:50 PM 08/23/2023   12:01 PM  CMP  Glucose 70 - 99 mg/dL 86  895  86   BUN 8 - 23 mg/dL 16  12  13    Creatinine 0.44 - 1.00 mg/dL 9.09  9.03  9.14   Sodium 135 - 145 mmol/L 136  138  142   Potassium 3.5 - 5.1 mmol/L 3.3  3.0  3.6   Chloride 98 - 111 mmol/L 102  102  102   CO2 22 - 32 mmol/L 25  25  27    Calcium  8.9 - 10.3 mg/dL 9.3  9.5  9.8   Total Protein 6.5 - 8.1 g/dL 7.0  7.2  6.7   Total Bilirubin 0.0 - 1.2 mg/dL 0.9  0.8  0.4   Alkaline Phos 38 - 126 U/L 59  42  61   AST 15 - 41 U/L 18  21  15    ALT 0 - 44 U/L 14  13  8      Lipid Panel     Component Value Date/Time   CHOL 261 (H) 08/15/2023 1413   TRIG 108 08/15/2023 1413   HDL 55 08/15/2023 1413   CHOLHDL 4.7 (H) 08/15/2023 1413   CHOLHDL 2.9 04/10/2009 0205   VLDL 18 04/10/2009 0205   LDLCALC 187 (H) 08/15/2023 1413    CBC    Component Value Date/Time   WBC 8.1 10/28/2023 1830   RBC 4.60 10/28/2023 1830   HGB 13.0 10/28/2023 1830   HGB 13.4 08/23/2023 1201   HCT 39.6 10/28/2023 1830   HCT 39.5 08/23/2023 1201   PLT  219 10/28/2023 1830   PLT 239 08/23/2023 1201   MCV 86.1 10/28/2023 1830   MCV 84 08/23/2023 1201   MCH 28.3 10/28/2023 1830   MCHC 32.8 10/28/2023 1830   RDW 14.1 10/28/2023 1830   RDW 13.4 08/23/2023 1201   LYMPHSABS 4.2 (H) 10/28/2023 1830   LYMPHSABS 3.4 (H) 08/23/2023 1201   MONOABS 0.7 10/28/2023 1830   EOSABS 0.1 10/28/2023 1830   EOSABS 0.1 08/23/2023 1201   BASOSABS 0.0 10/28/2023 1830   BASOSABS 0.0 08/23/2023 1201    Lab Results  Component Value Date   HGBA1C 5.7 02/17/2024       Assessment & Plan Postmenopausal bleeding Single episode of vaginal bleeding postmenopausal, requires evaluation. - Schedule Pap smear in 3-4 weeks.  Essential hypertension with diuretic-induced hypokalemia and peripheral edema Hypertension controlled with chlorthalidone , causing hypokalemia and edema. Edema possibly multifactorial. - Continue  chlorthalidone . - Restart potassium supplementation. - Consider compression stockings. - Monitor blood pressure.   Hypokalemia Refill potassium supplements Will check potassium in 1 week  Prediabetes Prediabetes with A1c of 5.7%.  -Lifestyle modifications to prevent progression to type 2 diabetes mellitus   Morbid obesity Discussed weight loss injections, insurance coverage needed. - Refer to weight management clinic.  Hyperlipidemia Hyperlipidemia on atorvastatin , requires re-evaluation. - Schedule fasting blood work.  Generalized hyperpigmented skin lesions Benign-appearing lesions likely due to aging. - Refer to dermatologist for full skin exam.     Meds ordered this encounter  Medications   potassium chloride  SA (KLOR-CON  M) 20 MEQ tablet    Sig: Take 1 tablet (20 mEq total) by mouth daily.    Dispense:  90 tablet    Refill:  1   atorvastatin  (LIPITOR) 20 MG tablet    Sig: Take 1 tablet (20 mg total) by mouth daily.    Dispense:  90 tablet    Refill:  3   chlorthalidone  (HYGROTON ) 25 MG tablet    Sig: Take 1 tablet (25 mg total) by mouth daily.    Dispense:  90 tablet    Refill:  3    Follow-up: Return in about 3 weeks (around 03/09/2024) for Pap smear.       Corrina Sabin, MD, FAAFP. [facility] [place], [place] [phone]   02/17/2024, 6:08 PM

## 2024-02-20 ENCOUNTER — Ambulatory Visit: Attending: Family Medicine

## 2024-02-20 DIAGNOSIS — I10 Essential (primary) hypertension: Secondary | ICD-10-CM

## 2024-02-20 DIAGNOSIS — E785 Hyperlipidemia, unspecified: Secondary | ICD-10-CM

## 2024-02-21 ENCOUNTER — Ambulatory Visit: Payer: Self-pay | Admitting: Family Medicine

## 2024-02-21 LAB — LP+NON-HDL CHOLESTEROL
Cholesterol, Total: 164 mg/dL (ref 100–199)
HDL: 52 mg/dL (ref >39)
LDL Chol Calc (NIH): 99 mg/dL (ref 0–99)
Total Non-HDL-Chol (LDL+VLDL): 112 mg/dL (ref 0–129)
Triglycerides: 64 mg/dL (ref 0–149)
VLDL Cholesterol Cal: 13 mg/dL (ref 5–40)

## 2024-02-21 LAB — CMP14+EGFR
ALT: 15 IU/L (ref 0–32)
AST: 19 IU/L (ref 0–40)
Albumin: 3.9 g/dL (ref 3.9–4.9)
Alkaline Phosphatase: 66 IU/L (ref 49–135)
BUN/Creatinine Ratio: 13 (ref 12–28)
BUN: 11 mg/dL (ref 8–27)
Bilirubin Total: 0.4 mg/dL (ref 0.0–1.2)
CO2: 25 mmol/L (ref 20–29)
Calcium: 9.5 mg/dL (ref 8.7–10.3)
Chloride: 103 mmol/L (ref 96–106)
Creatinine, Ser: 0.84 mg/dL (ref 0.57–1.00)
Globulin, Total: 2.5 g/dL (ref 1.5–4.5)
Glucose: 86 mg/dL (ref 70–99)
Potassium: 3.6 mmol/L (ref 3.5–5.2)
Sodium: 142 mmol/L (ref 134–144)
Total Protein: 6.4 g/dL (ref 6.0–8.5)
eGFR: 79 mL/min/1.73 (ref >59)

## 2024-03-04 ENCOUNTER — Telehealth: Payer: Self-pay | Admitting: Family Medicine

## 2024-03-04 NOTE — Telephone Encounter (Signed)
 Called pt, pt will be present at appt.

## 2024-03-05 ENCOUNTER — Ambulatory Visit: Admission: EM | Admit: 2024-03-05 | Discharge: 2024-03-05 | Disposition: A | Discharge status: Home / Self Care

## 2024-03-05 DIAGNOSIS — B349 Viral infection, unspecified: Secondary | ICD-10-CM

## 2024-03-05 LAB — POC SOFIA SARS ANTIGEN FIA: SARS Coronavirus 2 Ag: NEGATIVE

## 2024-03-05 MED ORDER — AZELASTINE HCL 0.1 % NA SOLN: 1.0000 | Freq: Two times a day (BID) | NASAL | 1 refills | Status: AC

## 2024-03-05 MED ORDER — PREDNISONE 20 MG PO TABS: 40.0000 mg | ORAL_TABLET | Freq: Every day | ORAL | 0 refills | Status: AC

## 2024-03-05 MED ORDER — PROMETHAZINE-DM 6.25-15 MG/5ML PO SYRP: 10.0000 mL | ORAL_SOLUTION | Freq: Three times a day (TID) | ORAL | 0 refills | Status: DC | PRN

## 2024-03-05 NOTE — ED Provider Notes (Signed)
 [facility]  Note:  This document was prepared using Dragon voice recognition software and may include unintentional dictation errors.  MRN: [id number] DOB: 10-19-1960  Subjective:   Sally Reid is a 63 y.o. female presenting for sore throat that started on Monday followed by chills, nasal congestion, runny nose, cough, headache, and chest congestion since Wednesday.  Patient denies any known sick contacts.  Denies taking any home COVID testing.  Patient has used over-the-counter cough and cold medication with minimal improvement.  Patient reports that she did get her influenza vaccine for the season but has not received any COVID-19 vaccine this year.  Patient reports some mild chest discomfort with coughing, no chest pain at rest, no shortness of breath, weakness, dizziness, fever.  No current facility-administered medications for this encounter.  Current Outpatient Medications:    acetaminophen  (TYLENOL ) 500 MG tablet, Take 500 mg by mouth daily., Disp: , Rfl:    azelastine  (ASTELIN ) 0.1 % nasal spray, Place 1 spray into both nostrils 2 (two) times daily. Use in each nostril as directed, Disp: 30 mL, Rfl: 1   Chlorphen-Pseudoephed-APAP (THERAFLU FLU/COLD PO), Take by mouth., Disp: , Rfl:    predniSONE  (DELTASONE ) 20 MG tablet, Take 2 tablets (40 mg total) by mouth daily for 5 days., Disp: 10 tablet, Rfl: 0   promethazine-dextromethorphan (PROMETHAZINE-DM) 6.25-15 MG/5ML syrup, Take 10 mLs by mouth 3 (three) times daily as needed., Disp: 240 mL, Rfl: 0   albuterol  (VENTOLIN  HFA) 108 (90 Base) MCG/ACT inhaler, Inhale 2 puffs into the lungs every 6 (six) hours as needed for wheezing or shortness of breath., Disp: , Rfl:    APPLE CIDER VINEGAR PO, Take 450 mg by mouth daily., Disp: , Rfl:    atorvastatin  (LIPITOR) 20 MG tablet, Take 1 tablet (20 mg total) by mouth daily., Disp: 90 tablet, Rfl: 3   chlorthalidone  (HYGROTON ) 25 MG tablet, Take 1 tablet (25 mg total) by  mouth daily., Disp: 90 tablet, Rfl: 3   clobetasol  cream (TEMOVATE ) 0.05 %, Apply 1 Application topically 2 (two) times daily. To affected area on left forearm (Patient taking differently: Apply 1 Application topically 2 (two) times daily as needed (rash). To affected area on left forearm), Disp: 60 g, Rfl: 1   diclofenac  Sodium (VOLTAREN  ARTHRITIS PAIN) 1 % GEL, Apply 2 g topically 4 (four) times daily as needed (shoulder pain)., Disp: , Rfl:    lidocaine  (LIDODERM ) 5 %, Place 1 patch onto the skin daily. Remove & Discard patch within 12 hours or as directed by MD, Disp: 30 patch, Rfl: 0   potassium chloride  SA (KLOR-CON  M) 20 MEQ tablet, Take 1 tablet (20 mEq total) by mouth daily., Disp: 90 tablet, Rfl: 1   traMADol  (ULTRAM ) 50 MG tablet, TAKE 1 TABLET BY MOUTH EVERY 6 HOURS AS NEEDED FOR MODERATE PAIN, Disp: 10 tablet, Rfl: 0   Allergies  Allergen Reactions   Latex Rash    On bandages    Past Medical History:  Diagnosis Date   Allergy     Arthritis    back/R shoulder   Chronic hepatitis C (HCC)    Fatty liver    GERD (gastroesophageal reflux disease)    Hyperlipidemia    Hypertension    on meds     Past Surgical History:  Procedure Laterality Date   ABDOMINAL HYSTERECTOMY  2005   ANKLE FRACTURE SURGERY Left 2011   CESAREAN SECTION     2 previous   KNEE ARTHROSCOPY Left 2008   for  cartilage repair   LEG SURGERY  2011   TUBAL LIGATION      Family History  Problem Relation Age of Onset   Stomach cancer Mother 16   Diabetes Mother    Hypertension Mother    Stomach cancer Brother    Hodgkin's lymphoma Brother 66   Colon polyps Neg Hx    Colon cancer Neg Hx    Esophageal cancer Neg Hx    Rectal cancer Neg Hx     Social History   Tobacco Use   Smoking status: Some Days    Current packs/day: 0.10    Types: Cigarettes   Smokeless tobacco: Never   Tobacco comments:    Smokes 2 cigarettes/day - cutting back  Vaping Use   Vaping status: Never Used  Substance Use  Topics   Alcohol use: Yes    Alcohol/week: 3.0 standard drinks of alcohol    Types: 3 Standard drinks or equivalent per week    Comment: occassionally   Drug use: No    ROS Refer to HPI for ROS details.  Objective:   Vitals: BP 129/82 (BP Location: Left Arm)   Pulse 68   Temp 98 F (36.7 C) (Oral)   Resp 20   Ht 4' 11 (1.499 m)   Wt 214 lb 8.1 oz (97.3 kg)   SpO2 98%   BMI 43.33 kg/m   Physical Exam Vitals and nursing note reviewed.  Constitutional:      General: She is not in acute distress.    Appearance: Normal appearance. She is well-developed. She is not ill-appearing or toxic-appearing.  HENT:     Head: Normocephalic and atraumatic.     Nose: Congestion and rhinorrhea present.     Mouth/Throat:     Mouth: Mucous membranes are moist.  Eyes:     General:        Right eye: No discharge.        Left eye: No discharge.     Extraocular Movements: Extraocular movements intact.     Conjunctiva/sclera: Conjunctivae normal.  Cardiovascular:     Rate and Rhythm: Normal rate and regular rhythm.     Heart sounds: Normal heart sounds. No murmur heard. Pulmonary:     Effort: Pulmonary effort is normal. No respiratory distress.     Breath sounds: Normal breath sounds. No stridor. No wheezing, rhonchi or rales.  Chest:     Chest wall: Tenderness present.  Skin:    General: Skin is warm and dry.  Neurological:     General: No focal deficit present.     Mental Status: She is alert and oriented to person, place, and time.  Psychiatric:        Mood and Affect: Mood normal.        Behavior: Behavior normal.     Procedures  Results for orders placed or performed during the hospital encounter of 03/05/24 (from the past 24 hours)  POC SARS Coronavirus 2 Ag     Status: Normal   Collection Time: 03/05/24 11:53 AM  Result Value Ref Range   SARS Coronavirus 2 Ag Negative Negative    No results found.   Assessment and Plan :     Discharge Instructions       1.  Acute viral syndrome (Primary) - POC SARS Coronavirus 2 Ag completed in UC is negative for coronavirus. - predniSONE  (DELTASONE ) 20 MG tablet; Take 2 tablets (40 mg total) by mouth daily for 5 days.  Dispense: 10 tablet; Refill:  0 - promethazine-dextromethorphan (PROMETHAZINE-DM) 6.25-15 MG/5ML syrup; Take 10 mLs by mouth 3 (three) times daily as needed.  Dispense: 240 mL; Refill: 0 - azelastine  (ASTELIN ) 0.1 % nasal spray; Place 1 spray into both nostrils 2 (two) times daily. Use in each nostril as directed  Dispense: 30 mL; Refill: 1 -Continue to monitor symptoms for any change in severity if there is any escalation of current symptoms or development of new symptoms follow-up in ER or return to UC for further evaluation and management.      Merrin Mcvicker B Katalin Colledge   Dezmond Downie, [place], [place] 03/05/24 1231

## 2024-03-05 NOTE — Discharge Instructions (Signed)
  1. Acute viral syndrome (Primary) - POC SARS Coronavirus 2 Ag completed in UC is negative for coronavirus. - predniSONE  (DELTASONE ) 20 MG tablet; Take 2 tablets (40 mg total) by mouth daily for 5 days.  Dispense: 10 tablet; Refill: 0 - promethazine-dextromethorphan (PROMETHAZINE-DM) 6.25-15 MG/5ML syrup; Take 10 mLs by mouth 3 (three) times daily as needed.  Dispense: 240 mL; Refill: 0 - azelastine  (ASTELIN ) 0.1 % nasal spray; Place 1 spray into both nostrils 2 (two) times daily. Use in each nostril as directed  Dispense: 30 mL; Refill: 1 -Continue to monitor symptoms for any change in severity if there is any escalation of current symptoms or development of new symptoms follow-up in ER or return to UC for further evaluation and management.

## 2024-03-05 NOTE — ED Triage Notes (Addendum)
 Patient reports onset of sore throat on Monday, accompanied by cold chills. Symptoms progressed on Tuesday to include nasal congestion and rhinorrhea. By Wednesday, the patient experienced a headache, persistent cough, and continued sore throat. The cough is associated with chest discomfort. She has not performed any at-home COVID-19 or flu testing. Flu vaccination has been completed for the current season; however, she has not received a COVID-19 vaccine. She is under regular care with their primary care provider (PCP). See Medlist for OTC Medication details.

## 2024-03-06 ENCOUNTER — Telehealth: Payer: Self-pay | Admitting: Family Medicine

## 2024-03-06 NOTE — Telephone Encounter (Signed)
 1st attempt contacted pt lvm to resch appt pcp not in the office

## 2024-03-09 ENCOUNTER — Encounter (INDEPENDENT_AMBULATORY_CARE_PROVIDER_SITE_OTHER): Payer: Self-pay

## 2024-03-09 ENCOUNTER — Ambulatory Visit: Admitting: Family Medicine

## 2024-04-06 ENCOUNTER — Encounter: Payer: Self-pay | Admitting: Radiology

## 2024-04-20 ENCOUNTER — Ambulatory Visit: Admitting: Family Medicine

## 2024-04-21 ENCOUNTER — Other Ambulatory Visit (HOSPITAL_COMMUNITY)
Admission: RE | Admit: 2024-04-21 | Discharge: 2024-04-21 | Disposition: A | Discharge status: Home / Self Care | Source: Ambulatory Visit | Attending: Family Medicine | Admitting: Family Medicine

## 2024-04-21 ENCOUNTER — Encounter: Payer: Self-pay | Admitting: Family Medicine

## 2024-04-21 ENCOUNTER — Ambulatory Visit: Attending: Family Medicine | Admitting: Family Medicine

## 2024-04-21 VITALS — BP 135/80 | HR 61 | Temp 98.5°F | Ht 59.0 in | Wt 217.2 lb

## 2024-04-21 DIAGNOSIS — J069 Acute upper respiratory infection, unspecified: Secondary | ICD-10-CM | POA: Diagnosis not present

## 2024-04-21 DIAGNOSIS — N950 Postmenopausal bleeding: Secondary | ICD-10-CM

## 2024-04-21 DIAGNOSIS — Z90710 Acquired absence of both cervix and uterus: Secondary | ICD-10-CM | POA: Diagnosis not present

## 2024-04-21 NOTE — Patient Instructions (Signed)
 VISIT SUMMARY:  Today, you were seen for postmenopausal bleeding, throat itching, and urinary pain when your bladder is full. We discussed your symptoms and performed a Pap smear on the vaginal wall due to your history of hysterectomy. We also reviewed your recent respiratory symptoms and urinary pain.  YOUR PLAN:  -POSTMENOPAUSAL BLEEDING: Postmenopausal bleeding is any vaginal bleeding that occurs after menopause. We performed a Pap smear on the vaginal wall to check for any signs of cancer. We will contact you with the results via phone call.  -ACUTE UPPER RESPIRATORY INFECTION: An acute upper respiratory infection is a viral infection that affects the nose, throat, and airways. You were advised to take Tylenol  for symptom relief, rest, and drink plenty of fluids. You can also take loratadine or Claritin to help manage your symptoms.  -URINARY PAIN WITH FULL BLADDER: Urinary pain with a full bladder can be due to an overactive bladder or bladder fullness. You were advised not to hold your urine to prevent pain and reduce the risk of a urinary tract infection. It is recommended to find a clean bathroom when you feel the urge to urinate.  INSTRUCTIONS:  We will contact you with the results of your Pap smear. If your symptoms worsen or you have any concerns, please schedule a follow-up appointment.

## 2024-04-21 NOTE — Progress Notes (Signed)
 Subjective:  Patient ID: Sally Reid, female    DOB: [birth year]  Age: 63 y.o. MRN: [id number]  CC: Gynecologic Exam     Discussed the use of AI scribe software for clinical note transcription with the patient, who gave verbal consent to proceed.  History of Present Illness Sally Reid is a 63 year old female with a history of hypertension, hyperlipidemia who presents with postmenopausal bleeding and throat itching.  She experienced a single episode of postmenopausal bleeding, which has resolved. Her last Pap smear was normal in 2023. She had a hysterectomy years ago. Throat itching began yesterday with slight drainage and rhinorrhea. There is no fever, chills, myalgias, or facial pain. She has had two recent respiratory infections, attributed to cold weather.  Abdominal pain on the right side occurs when she delays urination with no urgency, frequency or dysuria.    Past Medical History:  Diagnosis Date   Allergy     Arthritis    back/R shoulder   Chronic hepatitis C (HCC)    Fatty liver    GERD (gastroesophageal reflux disease)    Hyperlipidemia    Hypertension    on meds    Past Surgical History:  Procedure Laterality Date   ABDOMINAL HYSTERECTOMY  2005   ANKLE FRACTURE SURGERY Left 2011   CESAREAN SECTION     2 previous   KNEE ARTHROSCOPY Left 2008   for cartilage repair   LEG SURGERY  2011   TUBAL LIGATION      Family History  Problem Relation Age of Onset   Stomach cancer Mother 53   Diabetes Mother    Hypertension Mother    Stomach cancer Brother    Hodgkin's lymphoma Brother 67   Colon polyps Neg Hx    Colon cancer Neg Hx    Esophageal cancer Neg Hx    Rectal cancer Neg Hx     Social History   Socioeconomic History   Marital status: Married    Spouse name: Not on file   Number of children: Not on file   Years of education: Not on file   Highest education level: Not on file  Occupational History   Occupation: unemployed    Comment:  without health insurance  Tobacco Use   Smoking status: Some Days    Current packs/day: 0.10    Types: Cigarettes   Smokeless tobacco: Never   Tobacco comments:    Smokes 2 cigarettes/day - cutting back  Vaping Use   Vaping status: Never Used  Substance and Sexual Activity   Alcohol use: Yes    Alcohol/week: 3.0 standard drinks of alcohol    Types: 3 Standard drinks or equivalent per week    Comment: occassionally   Drug use: No   Sexual activity: Yes    Birth control/protection: Surgical  Other Topics Concern   Not on file  Social History Narrative   Lives in [place].   Had 2 children( two boys 102 and 29).   Living with fiancee.   Social Drivers of Corporate Investment Banker Strain: Low Risk  (08/15/2023)   Overall Financial Resource Strain (CARDIA)    Difficulty of Paying Living Expenses: Not very hard  Food Insecurity: No Food Insecurity (08/15/2023)   Hunger Vital Sign    Worried About Running Out of Food in the Last Year: Never true    Ran Out of Food in the Last Year: Never true  Transportation Needs: No Transportation Needs (08/15/2023)   PRAPARE - Transportation  Lack of Transportation (Medical): No    Lack of Transportation (Non-Medical): No  Physical Activity: Insufficiently Active (08/15/2023)   Exercise Vital Sign    Days of Exercise per Week: 1 day    Minutes of Exercise per Session: 30 min  Stress: Stress Concern Present (08/15/2023)   [employer] of Occupational Health - Occupational Stress Questionnaire    Feeling of Stress : Rather much  Social Connections: Moderately Integrated (08/15/2023)   Social Connection and Isolation Panel    Frequency of Communication with Friends and Family: More than three times a week    Frequency of Social Gatherings with Friends and Family: Three times a week    Attends Religious Services: More than 4 times per year    Active Member of Clubs or Organizations: Yes    Attends Banker Meetings: 1 to 4  times per year    Marital Status: Separated    Allergies  Allergen Reactions   Latex Rash    On bandages    Outpatient Medications Prior to Visit  Medication Sig Dispense Refill   acetaminophen  (TYLENOL ) 500 MG tablet Take 500 mg by mouth daily.     albuterol  (VENTOLIN  HFA) 108 (90 Base) MCG/ACT inhaler Inhale 2 puffs into the lungs every 6 (six) hours as needed for wheezing or shortness of breath.     APPLE CIDER VINEGAR PO Take 450 mg by mouth daily.     atorvastatin  (LIPITOR) 20 MG tablet Take 1 tablet (20 mg total) by mouth daily. 90 tablet 3   azelastine  (ASTELIN ) 0.1 % nasal spray Place 1 spray into both nostrils 2 (two) times daily. Use in each nostril as directed 30 mL 1   Chlorphen-Pseudoephed-APAP (THERAFLU FLU/COLD PO) Take by mouth. (Patient not taking: Reported on 04/21/2024)     chlorthalidone  (HYGROTON ) 25 MG tablet Take 1 tablet (25 mg total) by mouth daily. 90 tablet 3   clobetasol  cream (TEMOVATE ) 0.05 % Apply 1 Application topically 2 (two) times daily. To affected area on left forearm (Patient taking differently: Apply 1 Application topically 2 (two) times daily as needed (rash). To affected area on left forearm) 60 g 1   diclofenac  Sodium (VOLTAREN  ARTHRITIS PAIN) 1 % GEL Apply 2 g topically 4 (four) times daily as needed (shoulder pain).     lidocaine  (LIDODERM ) 5 % Place 1 patch onto the skin daily. Remove & Discard patch within 12 hours or as directed by MD (Patient not taking: Reported on 04/21/2024) 30 patch 0   potassium chloride  SA (KLOR-CON  M) 20 MEQ tablet Take 1 tablet (20 mEq total) by mouth daily. 90 tablet 1   promethazine-dextromethorphan (PROMETHAZINE-DM) 6.25-15 MG/5ML syrup Take 10 mLs by mouth 3 (three) times daily as needed. (Patient not taking: Reported on 04/21/2024) 240 mL 0   traMADol  (ULTRAM ) 50 MG tablet TAKE 1 TABLET BY MOUTH EVERY 6 HOURS AS NEEDED FOR MODERATE PAIN 10 tablet 0   No facility-administered medications prior to visit.      ROS Review of Systems  Constitutional:  Negative for activity change and appetite change.  HENT:  Positive for postnasal drip and rhinorrhea. Negative for sinus pressure and sore throat.   Respiratory:  Negative for chest tightness, shortness of breath and wheezing.   Cardiovascular:  Negative for chest pain and palpitations.  Gastrointestinal:  Negative for abdominal distention, abdominal pain and constipation.  Genitourinary: Negative.   Musculoskeletal: Negative.   Psychiatric/Behavioral:  Negative for behavioral problems and dysphoric mood.  Objective:  BP 135/80   Pulse 61   Temp 98.5 F (36.9 C) (Oral)   Ht 4' 11 (1.499 m)   Wt 217 lb 3.2 oz (98.5 kg)   SpO2 98%   BMI 43.87 kg/m      04/21/2024    9:48 AM 03/05/2024   11:48 AM 03/05/2024   11:45 AM  BP/Weight  Systolic BP 135 129   Diastolic BP 80 82   Wt. (Lbs) 217.2  214.51  BMI 43.87 kg/m2  43.33 kg/m2      Physical Exam Constitutional:      Appearance: She is well-developed.  Cardiovascular:     Rate and Rhythm: Normal rate.     Heart sounds: Normal heart sounds. No murmur heard. Pulmonary:     Effort: Pulmonary effort is normal.     Breath sounds: Normal breath sounds. No wheezing or rales.  Chest:     Chest wall: No tenderness.  Abdominal:     General: Bowel sounds are normal. There is no distension.     Palpations: Abdomen is soft. There is no mass.     Tenderness: There is no abdominal tenderness.  Genitourinary:    Comments: External genitalia, vagina normal. Absent cervix, normal adnexa, no CMT Musculoskeletal:        General: Normal range of motion.     Right lower leg: No edema.     Left lower leg: No edema.  Neurological:     Mental Status: She is alert and oriented to person, place, and time.  Psychiatric:        Mood and Affect: Mood normal.        Latest Ref Rng & Units 02/20/2024   10:09 AM 10/28/2023    6:30 PM 09/10/2023    2:50 PM  CMP  Glucose 70 - 99 mg/dL 86  86   895   BUN 8 - 27 mg/dL 11  16  12    Creatinine 0.57 - 1.00 mg/dL 9.15  9.09  9.03   Sodium 134 - 144 mmol/L 142  136  138   Potassium 3.5 - 5.2 mmol/L 3.6  3.3  3.0   Chloride 96 - 106 mmol/L 103  102  102   CO2 20 - 29 mmol/L 25  25  25    Calcium  8.7 - 10.3 mg/dL 9.5  9.3  9.5   Total Protein 6.0 - 8.5 g/dL 6.4  7.0  7.2   Total Bilirubin 0.0 - 1.2 mg/dL 0.4  0.9  0.8   Alkaline Phos 49 - 135 IU/L 66  59  42   AST 0 - 40 IU/L 19  18  21    ALT 0 - 32 IU/L 15  14  13      Lipid Panel     Component Value Date/Time   CHOL 164 02/20/2024 1009   TRIG 64 02/20/2024 1009   HDL 52 02/20/2024 1009   CHOLHDL 4.7 (H) 08/15/2023 1413   CHOLHDL 2.9 04/10/2009 0205   VLDL 18 04/10/2009 0205   LDLCALC 99 02/20/2024 1009    CBC    Component Value Date/Time   WBC 8.1 10/28/2023 1830   RBC 4.60 10/28/2023 1830   HGB 13.0 10/28/2023 1830   HGB 13.4 08/23/2023 1201   HCT 39.6 10/28/2023 1830   HCT 39.5 08/23/2023 1201   PLT 219 10/28/2023 1830   PLT 239 08/23/2023 1201   MCV 86.1 10/28/2023 1830   MCV 84 08/23/2023 1201   MCH 28.3 10/28/2023 1830  MCHC 32.8 10/28/2023 1830   RDW 14.1 10/28/2023 1830   RDW 13.4 08/23/2023 1201   LYMPHSABS 4.2 (H) 10/28/2023 1830   LYMPHSABS 3.4 (H) 08/23/2023 1201   MONOABS 0.7 10/28/2023 1830   EOSABS 0.1 10/28/2023 1830   EOSABS 0.1 08/23/2023 1201   BASOSABS 0.0 10/28/2023 1830   BASOSABS 0.0 08/23/2023 1201    Lab Results  Component Value Date   HGBA1C 5.7 02/17/2024       Assessment & Plan Postmenopausal bleeding Intermittent bleeding with previous normal Pap smear. Pap smear performed on vaginal wall due to hysterectomy. - Performed Pap smear on vaginal wall to check for cancer. - Will contact her with results via phone call.  Acute upper respiratory infection Symptoms suggest viral upper respiratory infection. No fever or significant body aches. - Recommended Tylenol  for symptom relief. - Advised rest and increased fluid  intake. - Suggested loratadine or Claritin for symptom management.        No orders of the defined types were placed in this encounter.   Follow-up: Return in about 3 months (around 07/22/2024) for Chronic medical conditions.       Corrina Sabin, MD, FAAFP. [facility] [place], [place] [phone]   04/21/2024, 10:15 AM

## 2024-04-22 LAB — HM MAMMOGRAPHY

## 2024-04-23 ENCOUNTER — Ambulatory Visit: Payer: Self-pay | Admitting: Family Medicine

## 2024-04-24 LAB — CYTOLOGY - PAP
Comment: NEGATIVE
Diagnosis: NEGATIVE
High risk HPV: NEGATIVE

## 2024-04-27 ENCOUNTER — Ambulatory Visit: Payer: Self-pay | Admitting: Family Medicine

## 2024-06-22 DIAGNOSIS — Z0289 Encounter for other administrative examinations: Secondary | ICD-10-CM

## 2024-06-23 ENCOUNTER — Ambulatory Visit (INDEPENDENT_AMBULATORY_CARE_PROVIDER_SITE_OTHER): Admitting: Physician Assistant

## 2024-06-23 ENCOUNTER — Encounter (INDEPENDENT_AMBULATORY_CARE_PROVIDER_SITE_OTHER): Payer: Self-pay | Admitting: Physician Assistant

## 2024-06-23 VITALS — BP 126/77 | HR 66 | Temp 98.8°F | Ht 59.0 in | Wt 214.0 lb

## 2024-06-23 DIAGNOSIS — Z6841 Body Mass Index (BMI) 40.0 and over, adult: Secondary | ICD-10-CM | POA: Diagnosis not present

## 2024-06-23 DIAGNOSIS — K760 Fatty (change of) liver, not elsewhere classified: Secondary | ICD-10-CM

## 2024-06-23 DIAGNOSIS — I10 Essential (primary) hypertension: Secondary | ICD-10-CM | POA: Diagnosis not present

## 2024-06-23 DIAGNOSIS — K5900 Constipation, unspecified: Secondary | ICD-10-CM

## 2024-06-23 DIAGNOSIS — E042 Nontoxic multinodular goiter: Secondary | ICD-10-CM

## 2024-06-23 DIAGNOSIS — E785 Hyperlipidemia, unspecified: Secondary | ICD-10-CM | POA: Diagnosis not present

## 2024-06-23 DIAGNOSIS — Z720 Tobacco use: Secondary | ICD-10-CM

## 2024-06-23 DIAGNOSIS — B182 Chronic viral hepatitis C: Secondary | ICD-10-CM

## 2024-06-23 NOTE — Progress Notes (Signed)
 " Office: [phone]  /  Fax: [phone]   Initial Visit    Sally Reid was seen in clinic today to evaluate for obesity. She is interested in losing weight to improve overall health and reduce the risk of weight related complications. She presents today to review program treatment options, initial physical assessment, and evaluation.     She was referred by: PCP  When asked what else they would like to accomplish? She states: Adopt a healthier eating pattern and lifestyle, Improve energy levels and physical activity, Improve existing medical conditions, Improve quality of life, Improve appearance, and Improve self-confidence  When asked how has your weight affected you? She states: Contributed to medical problems, Contributed to orthopedic problems or mobility issues, Having fatigue, Having poor endurance, and Problems with eating patterns  Weight history: Sally Reid is a 64 year old female who presents for an initial obesity treatment consultation.  She has experienced weight gain since the birth of her two children, with a steady increase over the years. She was not overweight as a child but began gaining weight post-pregnancy. She noticed increased cravings for sweets and sodas around the time of menopause.  Over the past year, her weight has fluctuated, with periods of weight loss followed by regaining the lost weight. She attributes some of her weight gain to increased responsibilities and stress after her mother's passing and taking care of her brother, who has schizophrenia. She also works as a Psychologist, Clinical .   Her current weight management efforts include plans to start meal preparation focusing on portion control and smart choices. She has not engaged in regular exercise recently and has not tried weight loss medications in the past. She has attempted weight loss multiple times, including methods like portion control and meal replacement, but has not tried programs like  Weight Watchers or Nutrisystem.  She reports a family history of obesity, with all her family members becoming obese later in life. Her mother passed away from pancreatic cancer four years ago.  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  No history of heart disease, lung disease, kidney disease, or diabetes. She is not aware of any fatty liver disease or insulin resistance. She does not currently take any weight-promoting medications.  Highest weight: 227 lbs  Some associated conditions: Hypertension and Hyperlipidemia  Contributing factors: family history of obesity, disruption of circadian rhythm / sleep disordered breathing, consumption of processed foods, moderate to high levels of stress, reduced physical activity, chronic skipping of meals, strong orexigenic signaling and/or inadequate inhibitory control , slow metabolism for age, need for convenience due to lack of time, multiple weight loss attempts in the past, hectic pace of life, need for convenient foods, and self - critic or all-or-none mindset  Weight promoting medications identified: None  Prior weight loss attempts: Meal Replacements and Balanced Plate / Portion Control  Current nutrition plan: Portion control / smart choices  Current level of physical activity: None  Current or previous pharmacotherapy: None and Is interested in pharmacotherapy  Response to medication: Never tried medications   Past medical history includes:   Past Medical History:  Diagnosis Date   Allergy     Arthritis    back/R shoulder   Chronic hepatitis C (HCC)    Fatty liver    GERD (gastroesophageal reflux disease)    Hyperlipidemia    Hypertension  on meds     Objective    BP 126/77   Pulse 66   Temp 98.8 F (37.1 C)   Ht 4' 11 (1.499 m)   Wt 214 lb (97.1 kg)   SpO2 99%   BMI 43.22 kg/m   She was weighed on the bioimpedance scale: Body mass index is 43.22 kg/m.  Body Fat%:51.4%, Visceral Fat Rating:18, Weight trend over the last 12 months: Unchanged  General:  Alert, oriented and cooperative. Patient is in no acute distress.  Respiratory: Normal respiratory effort, no problems with respiration noted   Gait: able to ambulate independently  Mental Status: Normal mood and affect. Normal behavior. Normal judgment and thought content.   DIAGNOSTIC DATA REVIEWED:  BMET    Component Value Date/Time   NA 142 02/20/2024 1009   K 3.6 02/20/2024 1009   CL 103 02/20/2024 1009   CO2 25 02/20/2024 1009   GLUCOSE 86 02/20/2024 1009   GLUCOSE 86 10/28/2023 1830   BUN 11 02/20/2024 1009   CREATININE 0.84 02/20/2024 1009   CREATININE 0.83 06/13/2022 0924   CALCIUM  9.5 02/20/2024 1009   GFRNONAA >60 10/28/2023 1830   GFRAA >60 12/17/2018 0954   Lab Results  Component Value Date   HGBA1C 5.7 02/17/2024   HGBA1C  04/11/2009    5.2 (NOTE) The ADA recommends the following therapeutic goal for glycemic control related to Hgb A1c measurement: Goal of therapy: <6.5 Hgb A1c  Reference: American Diabetes Association: Clinical Practice Recommendations 2010, Diabetes Care, 2010, 33: (Suppl  1).   No results found for: INSULIN CBC    Component Value Date/Time   WBC 8.1 10/28/2023 1830   RBC 4.60 10/28/2023 1830   HGB 13.0 10/28/2023 1830   HGB 13.4 08/23/2023 1201   HCT 39.6 10/28/2023 1830   HCT 39.5 08/23/2023 1201   PLT 219 10/28/2023 1830   PLT 239 08/23/2023 1201   MCV 86.1 10/28/2023 1830   MCV 84 08/23/2023 1201   MCH 28.3 10/28/2023 1830   MCHC 32.8 10/28/2023 1830   RDW 14.1 10/28/2023 1830   RDW 13.4 08/23/2023 1201   Iron/TIBC/Ferritin/ %Sat No results found for: IRON, TIBC, FERRITIN, IRONPCTSAT Lipid Panel     Component Value Date/Time   CHOL 164 02/20/2024 1009   TRIG 64 02/20/2024 1009   HDL 52 02/20/2024 1009   CHOLHDL 4.7 (H) 08/15/2023 1413    CHOLHDL 2.9 04/10/2009 0205   VLDL 18 04/10/2009 0205   LDLCALC 99 02/20/2024 1009   Hepatic Function Panel     Component Value Date/Time   PROT 6.4 02/20/2024 1009   ALBUMIN 3.9 02/20/2024 1009   AST 19 02/20/2024 1009   ALT 15 02/20/2024 1009   ALKPHOS 66 02/20/2024 1009   BILITOT 0.4 02/20/2024 1009      Component Value Date/Time   TSH 0.413 (L) 08/15/2023 1413     Assessment and Plan   Essential hypertension  ? Fatty liver- CT of ABD- 10/2023 normal liver  Chronic hepatitis C without hepatic coma (HCC)  Obstipation  Tobacco use  Multiple thyroid  nodules  Morbid obesity with BMI of 45.0-49.9, adult (HCC)  Current BMI 43.2  Assessment and Plan Assessment & Plan Obesity Body fat percentage of 51% and a visceral fat rating of 18, increasing risk for cardiovascular disease, stroke, and certain cancers. Weight gain post-menopause and stress-related eating patterns. No current use of weight loss medications. Interested in non-pharmacological weight loss strategies. - Schedule metabolism test to determine caloric and macronutrient needs. -  Initiate nutrition plan based on metabolism test results and personal preferences. - Schedule follow-up appointments every 2-3 weeks for the first 3 months to monitor progress and adjust plan as needed. - Educated on the importance of maintaining muscle mass and reducing body fat percentage to 35% or less. - Discussed potential use of weight loss medications if non-pharmacological strategies are insufficient.  Essential hypertension Obesity contributes to increased cardiovascular risk. - Continue current antihypertensive medication regimen. - Monitor blood pressure regularly. -Work on nutrition plan to promote weight loss and improve BP control.   Hyperlipidemia LDL is not at goal. Medication(s): Statin therapy Cardiovascular risk factors: dyslipidemia, hypertension, obesity (BMI >= 30 kg/m2), sedentary lifestyle, and smoking/  tobacco exposure  Lab Results  Component Value Date   CHOL 164 02/20/2024   HDL 52 02/20/2024   LDLCALC 99 02/20/2024   TRIG 64 02/20/2024   CHOLHDL 4.7 (H) 08/15/2023   CHOLHDL 2.9 04/10/2009   CHOLHDL 3.0 Ratio 12/08/2008   Lab Results  Component Value Date   ALT 15 02/20/2024   AST 19 02/20/2024   ALKPHOS 66 02/20/2024   BILITOT 0.4 02/20/2024   The 10-year ASCVD risk score (Arnett DK, et al., 2019) is: 12.9%   Values used to calculate the score:     Age: 83 years     Clinically relevant sex: Female     Is Non-Hispanic African American: Yes     Diabetic: No     Tobacco smoker: Yes     Systolic Blood Pressure: 126 mmHg     Is BP treated: Yes     HDL Cholesterol: 52 mg/dL     Total Cholesterol: 164 mg/dL  Plan: Continue statin. Establish nutrition plan -decreasing simple carbohydrates, increasing lean proteins, decreasing saturated fats and cholesterol , avoiding trans fats and exercise as able to promote weight loss, improve lipids and decrease cardiovascular risks.            Obesity Treatment / Action Plan:  Patient will work on garnering support from family and friends to begin weight loss journey. Will work on eliminating or reducing the presence of highly palatable, calorie dense foods in the home. Will complete provided nutritional and psychosocial assessment questionnaire before the next appointment. Will be scheduled for indirect calorimetry to determine resting energy expenditure in a fasting state.  This will allow us  to create a reduced calorie, high-protein meal plan to promote loss of fat mass while preserving muscle mass. Counseled on the health benefits of losing 5%-15% of total body weight. Was counseled on nutritional approaches to weight loss and benefits of reducing processed foods and consuming plant-based foods and high quality protein as part of nutritional weight management. Was counseled on pharmacotherapy and role as an adjunct in weight  management.   Obesity Education Performed Today:  She was weighed on the bioimpedance scale and results were discussed and documented in the synopsis.  We discussed obesity as a disease and the importance of a more detailed evaluation of all the factors contributing to the disease.  We discussed the importance of long term lifestyle changes which include nutrition, exercise and behavioral modifications as well as the importance of customizing this to her specific health and social needs.  We discussed the benefits of reaching a healthier weight to alleviate the symptoms of existing conditions and reduce the risks of the biomechanical, metabolic and psychological effects of obesity.  We reviewed the four pillars of obesity medicine and importance of using a multimodal approach.  We  reviewed the basic principles in weight management.   Sally Reid appears to be in the action stage of change and states they are ready to start intensive lifestyle modifications and behavioral modifications.  I have spent 39 minutes in the care of the patient today including: 4 minutes before the visit reviewing and preparing the chart. 30 minutes face-to-face assessing and reviewing listed medical problems as outlined in obesity care plan, providing nutritional and behavioral counseling on topics outlined in the obesity care plan, independently interpreting test results and goals of care, as described in assessment and plan, reviewing and discussing biometric information and progress, and reviewing latest PCP notes and specialist consultations 5 minutes after the visit updating chart and documentation of encounter.  Reviewed by clinician on day of visit: allergies, medications, problem list, medical history, surgical history, family history, social history, and previous encounter notes pertinent to obesity diagnosis.   Sally Artist,PA-C     "

## 2024-07-02 ENCOUNTER — Encounter (INDEPENDENT_AMBULATORY_CARE_PROVIDER_SITE_OTHER): Payer: Self-pay | Admitting: Family Medicine

## 2024-07-02 ENCOUNTER — Ambulatory Visit (INDEPENDENT_AMBULATORY_CARE_PROVIDER_SITE_OTHER): Admitting: Family Medicine

## 2024-07-02 VITALS — BP 134/81 | HR 65 | Temp 98.6°F | Ht 58.5 in | Wt 214.0 lb

## 2024-07-02 DIAGNOSIS — Z6841 Body Mass Index (BMI) 40.0 and over, adult: Secondary | ICD-10-CM

## 2024-07-02 DIAGNOSIS — R5383 Other fatigue: Secondary | ICD-10-CM

## 2024-07-02 DIAGNOSIS — F5089 Other specified eating disorder: Secondary | ICD-10-CM | POA: Diagnosis not present

## 2024-07-02 DIAGNOSIS — Z720 Tobacco use: Secondary | ICD-10-CM

## 2024-07-02 DIAGNOSIS — E782 Mixed hyperlipidemia: Secondary | ICD-10-CM | POA: Diagnosis not present

## 2024-07-02 DIAGNOSIS — I10 Essential (primary) hypertension: Secondary | ICD-10-CM

## 2024-07-02 DIAGNOSIS — E66813 Obesity, class 3: Secondary | ICD-10-CM

## 2024-07-02 DIAGNOSIS — R0602 Shortness of breath: Secondary | ICD-10-CM

## 2024-07-02 DIAGNOSIS — Z1331 Encounter for screening for depression: Secondary | ICD-10-CM | POA: Diagnosis not present

## 2024-07-02 DIAGNOSIS — Z72821 Inadequate sleep hygiene: Secondary | ICD-10-CM

## 2024-07-02 DIAGNOSIS — Z636 Dependent relative needing care at home: Secondary | ICD-10-CM

## 2024-07-02 DIAGNOSIS — M159 Polyosteoarthritis, unspecified: Secondary | ICD-10-CM

## 2024-07-02 NOTE — Progress Notes (Signed)
 "  Sally Reid, D.O.  ABFM, ABOM Specializing in Clinical Bariatric Medicine Office located at: [address]  [place], [place]  [address]   Bariatric Medicine Visit  Dear Sally Clam, MD   Thank you for referring Sally Reid to our clinic today for evaluation.  We performed a consultation to discuss her options for treatment and educate the patient on her disease state.  The following note includes my evaluation and treatment recommendations.   Please do not hesitate to reach out to me directly if you have any further concerns.    Assessment and Plan:   Orders Placed This Encounter  Procedures   Magnesium    TSH   T4, free   T3   VITAMIN D 25 Hydroxy (Vit-D Deficiency, Fractures)   Vitamin B12   Folate   Insulin, random   Hemoglobin A1c   Lipid panel   Comprehensive metabolic panel with GFR   CBC with Differential/Platelet   Ambulatory referral to Psychology   EKG 12-Lead     FOR THE DISEASE OF OBESITY:  Class 3 severe obesity with body mass index (BMI) of 40.0 to 44.9 in adult, starting BMI 43.96 Assessment & Plan: Sally Reid is currently in the action stage of change. As such, her goal is to start our weight management plan.  She has agreed to implement: Cat 1 MP   Behavioral Intervention We discussed the following Behavioral Modification Strategies today: increasing lean protein intake to established goals, avoiding skipping meals, increasing water intake , keeping healthy foods at home, and work on managing stress, creating time for self-care and relaxation  Additional resources provided today: Handout on CAT 1 meal plan , Handout on CAT 1-2 breakfast options, and Handout on CAT 1-2 lunch options  Evidence-based interventions for health behavior change were utilized today including the discussion of self monitoring techniques, problem-solving barriers and SMART goal setting techniques.    Goal(s) for next OV: weigh lean proteins and measure  veggies.     Recommended Physical Activity Goals Sally Reid has been advised to work up to 300-450  minutes of moderate intensity aerobic activity a week and strengthening exercises 2-3 times per week for cardiovascular health, weight loss maintenance and preservation of muscle mass.   She has agreed to : maintain current level of activity.    Pharmacotherapy We discussed various medication options to help Sally Reid with her weight loss efforts and we both agreed to: Begin with nutritional and behavioral strategies   ASSOCIATED CONDITIONS ADDRESSED TODAY:  Fatigue Assessment & Plan: Sally Reid does feel that her weight is causing her energy to be lower than it should be. Fatigue may be related to obesity, depression or many other causes. she does not appear to have any red flag symptoms and this appears to most likely be related to her current lifestyle habits and dietary intake.  Labs will be ordered and reviewed with her at their next office visit in two weeks.    Poor sleep hygiene Epworth sleepiness scale is 3 and appears to be within normal limits. Sally Reid denies daytime somnolence and admits to waking up still tired. Patient has a history of symptoms of hypertension. Sally Reid generally gets 4 or 5 hours of sleep per night, and states that she has difficulty falling asleep. Snoring is present. Apneic episodes are not present.   Patient reports that she will fall asleep and then will wake up. She can sleep for about 2 hours and then wake up for 3 hours and then fall  asleep for 2 or 3 more hours. Explained to patient the importance of sleep hygiene. Goal sleep is 7-9 hours.   ECG: Performed and reviewed/ interpreted independently.  Normal sinus rhythm, rate 64 bpm; reassuring without any acute abnormalities, will continue to monitor for symptoms     Shortness of breath on exertion Assessment & Plan: Sally Reid does feel that she gets out of breath more easily than she used to when she  exercises and seems to be worsening over time with weight gain.  This has gotten worse recently. Sally Reid denies shortness of breath at rest or orthopnea. Pt denies chest pain, dizziness, heart palpitations, or excessive diaphoresis or nausea with activity.  This is not new and is ongoing.  Sally Reid's shortness of breath appears to be obesity related and exercise induced, as they do not appear to have any red flag symptoms/ concerns today.  Also, this condition appears to be related to a state of poor cardiovascular conditioning   Obtain labs today and will be reviewed with her at their next office visit in two weeks.  Indirect Calorimeter completed today to help guide our dietary regimen. It shows a VO2 of 170 and a REE of 1166.  Her calculated basal metabolic rate is 8512 thus her resting energy expenditure is worse than expected.  Patient agreed to work on weight loss at this time.  As Sally Reid progresses through our weight loss program, we will gradually increase exercise as tolerated to treat her current condition.   If Sally Reid follows our recommendations and loses 5-10% of their weight without improvement of her shortness of breath or if at any time, symptoms become more concerning, they agree to urgently follow up with their PCP/ specialist for further consideration/ evaluation.   Sally Reid verbalizes agreement with this plan.     Caregiver stress - emotional eating Depression Screen  Assessment & Plan: Her PHQ-9 score was 3 today. Patient states that she has high stress levels. 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 She worries about everything and her family. She reports eating when she is stressed and she is bored. She thinks that her emotions do drive her eating habits at times. Explained to patient the importance of finding self care and finding a veterinary surgeon. Will refer patient to Dr. Sharron today. Will  reassess at next OV.     Essential hypertension Assessment & Plan BP Readings from Last 3 Encounters:  07/02/24 134/81  06/23/24 126/77  04/21/24 135/80   The 10-year ASCVD risk score (Arnett DK, et al., 2019) is: 15%  Lab Results  Component Value Date   CREATININE 0.84 02/20/2024   On hygroton  25 mg daily with reported good compliance and tolerance. BP today is not at goal. Patient states that her car this morning would not start and she would stressed and that could be why her BP was slightly elevated. She reports that she has had high BP for about 4 years. Explained to patient that goal BP is 120/80. Continue with medication and begin following prudent meal plan.     Mixed hyperlipidemia Assessment & Plan Lab Results  Component Value Date   CHOL 164 02/20/2024   HDL 52 02/20/2024   LDLCALC 99 02/20/2024   TRIG 64 02/20/2024   CHOLHDL 4.7 (H) 08/15/2023    On Lipitor 20 mg daily with reported good compliance and tolerance. Patient states that she was diagnosed and on medication  for about 2 to 3 years. Explained to patient the importance of decreasing saturated and trans fats. Continue with medication. Will obtain labs today and reassess at next OV.     Osteoarthritis of multiple joints, unspecified osteoarthritis type Assessment & Plan Patient states that she has severe back pain. She reports that she has some shoulder and leg pain as well. She takes Ultram  50 mg prn for pain. Patient states that she was seeing a specialist for her back about 2 years ago and she would receive a shot every 6 months. Now she states that she is stable as long as she doesn't do anything that flares her back pain up. She denies doing any back strength training exercises. Recommended that patient reach out to her PCP about physical therapy. Stressed to patient the importance of doing some back strength training exercises to help reduce back pain. Will reassess at next OV.     Tobacco  use Assessment & Plan Patient states that she smokes about 1 pack per week for the last 20 years. She reports not smoking throughout the day due to her job but at the end of the day she will smoke 1 or 2 cigarette to help her relax. Explained to patient that smoking puts her at a high risk for lung disease. Recommended that she explore 1800quitnow when she is ready to quit smoking.     FOLLOW UP:   Follow up in 2 weeks. She was informed of the importance of frequent follow up visits to maximize her success with intensive lifestyle modifications for her multiple health conditions.  Sally Reid is aware that we will review all of her lab results at our next visit.  She is aware that if anything is critical/ life threatening with the results, we will be contacting her via MyChart prior to the office visit to discuss management.     Chief Complaint:   OBESITY Sally Reid (MR# [id number]) is a pleasant 64 y.o. female who presents for evaluation and treatment of obesity and related comorbidities. Current BMI is Body mass index is 43.96 kg/m. Sally Reid has been struggling with her weight for many years and has been unsuccessful in either losing weight, maintaining weight loss, or reaching her healthy weight goal.  Sally Reid is currently in the action stage of change and ready to dedicate time achieving and maintaining a healthier weight. Sally Reid is interested in becoming our patient and working on intensive lifestyle modifications including (but not limited to) diet and exercise for weight loss.  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 Patient is separated and has children. She lives with her brother.  - Does not engage in any formal exercise.   - Wants to be 150 lbs in 5 to 12 months.   - Wants to be that weight because that was her highschool weight.   - Last year was her highest weight at 227 lbs.   - Started gaining weight after having children.  -  Eats too much fast food,soda and sweets.  - Has not tried any diets in the past but has tried portion control and stopped eating fast food, sodas and sweets.   - Eats fast food and take out 3 times per week.   - Does not like to cook but does so.   - Craves chicken, fish and sweets.  - Does frequently snack after dinner.   - Snacks on cakes and chips   - Skips breakfast most days  of the week.   - Drinks caloric beverages like; 1-2 regular sodas per week, sweet tea with sugar and wine 1 cup every 2 weeks.   - Worst food habit is after dinner snacking.    Subjective:   This is the patient's first visit at [facility].  The patient's NEW PATIENT PACKET that they filled out prior to today's office visit was reviewed at length and information from that paperwork was included within the following office visit note.    Included in the packet: current and past health history, medications, allergies, ROS, gynecologic history (women only), surgical history, family history, social history, weight history, weight loss surgery history (for those that have had weight loss surgery), nutritional evaluation, mood and food questionnaire along with a depression screening (PHQ9) on all patients, an Epworth questionnaire, sleep habits questionnaire, patient life and health improvement goals questionnaire. These will all be scanned into the patient's chart under the media tab.   Review of Systems: Please refer to new patient packet scanned into media. Pertinent positives were addressed with patient today.  Reviewed by clinician on day of visit: allergies, medications, problem list, medical history, surgical history, family history, social history, and previous encounter notes.  During the visit, I independently reviewed the patient's EKG, bioimpedance scale results, and indirect calorimeter results. I used this information to tailor a meal plan for the patient that will help Sally Reid to lose weight and will improve her obesity-related conditions going forward.  I performed a medically necessary appropriate examination and/or evaluation. I discussed the assessment and treatment plan with the patient. The patient was provided an opportunity to ask questions and all were answered. The patient agreed with the plan and demonstrated an understanding of the instructions. Labs were ordered today (unless patient declined them) and will be reviewed with the patient at our next visit unless more critical results need to be addressed immediately. Clinical information was updated and documented in the EMR.    Objective:   PHYSICAL EXAM: Blood pressure 134/81, pulse 65, temperature 98.6 F (37 C), height 4' 10.5 (1.486 m), weight 214 lb (97.1 kg), SpO2 98%. Body mass index is 43.96 kg/m.  General: Well Developed, well nourished, and in no acute distress.  HEENT: Normocephalic, atraumatic; EOMI, sclerae are anicteric. Skin: Warm and dry, good turgor Chest:  Normal excursion, shape, no gross ABN Respiratory: No conversational dyspnea; speaking in full sentences NeuroM-Sk:  Normal gross ROM * 4 extremities  Psych: A and O *3, insight adequate, mood- full   Anthropometric Measurements Height: 4' 10.5 (1.486 m) Weight: 214 lb (97.1 kg) BMI (Calculated): 43.96 Weight at Last Visit: 0 Weight Lost Since Last Visit: 0 Weight Gained Since Last Visit: 0 Starting Weight: 214lb Total Weight Loss (lbs): 0 lb (0 kg) Peak Weight: 227lb Waist Measurement : 44.5 inches   Body Composition  Body Fat %: 52 % Fat Mass (lbs): 111.4 lbs Muscle Mass (lbs): 97.8 lbs Total Body Water (lbs): 74 lbs Visceral Fat Rating : 18   Other Clinical Data RMR: 1166 Fasting: yes Labs: yes Today's Visit #: 1 Starting Date: 07/02/24 Comments: New Patient    DIAGNOSTIC DATA REVIEWED:  BMET    Component Value Date/Time   NA 142 02/20/2024 1009   K 3.6 02/20/2024 1009   CL 103 02/20/2024  1009   CO2 25 02/20/2024 1009   GLUCOSE 86 02/20/2024 1009   GLUCOSE 86 10/28/2023 1830   BUN 11 02/20/2024 1009   CREATININE 0.84  02/20/2024 1009   CREATININE 0.83 06/13/2022 0924   CALCIUM  9.5 02/20/2024 1009   GFRNONAA >60 10/28/2023 1830   GFRAA >60 12/17/2018 0954   Lab Results  Component Value Date   HGBA1C 5.7 02/17/2024   HGBA1C  04/11/2009    5.2 (NOTE) The ADA recommends the following therapeutic goal for glycemic control related to Hgb A1c measurement: Goal of therapy: <6.5 Hgb A1c  Reference: American Diabetes Association: Clinical Practice Recommendations 2010, Diabetes Care, 2010, 33: (Suppl  1).   No results found for: INSULIN Lab Results  Component Value Date   TSH 0.413 (L) 08/15/2023   CBC    Component Value Date/Time   WBC 8.1 10/28/2023 1830   RBC 4.60 10/28/2023 1830   HGB 13.0 10/28/2023 1830   HGB 13.4 08/23/2023 1201   HCT 39.6 10/28/2023 1830   HCT 39.5 08/23/2023 1201   PLT 219 10/28/2023 1830   PLT 239 08/23/2023 1201   MCV 86.1 10/28/2023 1830   MCV 84 08/23/2023 1201   MCH 28.3 10/28/2023 1830   MCHC 32.8 10/28/2023 1830   RDW 14.1 10/28/2023 1830   RDW 13.4 08/23/2023 1201   Iron Studies No results found for: IRON, TIBC, FERRITIN, IRONPCTSAT Lipid Panel     Component Value Date/Time   CHOL 164 02/20/2024 1009   TRIG 64 02/20/2024 1009   HDL 52 02/20/2024 1009   CHOLHDL 4.7 (H) 08/15/2023 1413   CHOLHDL 2.9 04/10/2009 0205   VLDL 18 04/10/2009 0205   LDLCALC 99 02/20/2024 1009   Hepatic Function Panel     Component Value Date/Time   PROT 6.4 02/20/2024 1009   ALBUMIN 3.9 02/20/2024 1009   AST 19 02/20/2024 1009   ALT 15 02/20/2024 1009   ALKPHOS 66 02/20/2024 1009   BILITOT 0.4 02/20/2024 1009      Component Value Date/Time   TSH 0.413 (L) 08/15/2023 1413   Nutritional No results found for: VD25OH  Attestation Statements:   Sally Reid, acting as a medical scribe for Sally Jenkins, DO., have  compiled all relevant documentation for today's office visit on behalf of Sally Jenkins, DO, while in the presence of [employer], DO.  I have spent 66 minutes in the care of the patient today including: 44 minutes face-to-face assessing and reviewing listed medical problems above as outlined in office visit note, providing nutritional and behavioral counseling as outlined in obesity care plan, independently interpreting results and goals of care, see listed medical problems, and discussing biometric information and progress. 22 minutes was spent on review of chart/new patient packet and additional post-visit documentation.  I have reviewed the above documentation for accuracy and completeness, and I agree with the above. Sally JINNY Reid, D.O.  The 21st Century Cures Act was signed into law in 2016 which includes the topic of electronic health records.  This provides immediate access to information in MyChart.  This includes consultation notes, operative notes, office notes, lab results and pathology reports.  If you have any questions about what you read please let us  know at your next visit so we can discuss your concerns and take corrective action if need be.  We are right here with you. "

## 2024-07-03 LAB — CBC WITH DIFFERENTIAL/PLATELET
Basophils Absolute: 0 10*3/uL (ref 0.0–0.2)
Basos: 0 %
EOS (ABSOLUTE): 0 10*3/uL (ref 0.0–0.4)
Eos: 1 %
Hematocrit: 44.2 % (ref 34.0–46.6)
Hemoglobin: 14.4 g/dL (ref 11.1–15.9)
Immature Grans (Abs): 0 10*3/uL (ref 0.0–0.1)
Immature Granulocytes: 0 %
Lymphocytes Absolute: 2.6 10*3/uL (ref 0.7–3.1)
Lymphs: 41 %
MCH: 28.6 pg (ref 26.6–33.0)
MCHC: 32.6 g/dL (ref 31.5–35.7)
MCV: 88 fL (ref 79–97)
Monocytes Absolute: 0.4 10*3/uL (ref 0.1–0.9)
Monocytes: 6 %
Neutrophils Absolute: 3.3 10*3/uL (ref 1.4–7.0)
Neutrophils: 52 %
Platelets: 233 10*3/uL (ref 150–450)
RBC: 5.03 x10E6/uL (ref 3.77–5.28)
RDW: 13.5 % (ref 11.7–15.4)
WBC: 6.4 10*3/uL (ref 3.4–10.8)

## 2024-07-03 LAB — TSH: TSH: 0.544 u[IU]/mL (ref 0.450–4.500)

## 2024-07-03 LAB — COMPREHENSIVE METABOLIC PANEL WITH GFR
ALT: 17 [IU]/L (ref 0–32)
AST: 20 [IU]/L (ref 0–40)
Albumin: 4.2 g/dL (ref 3.9–4.9)
Alkaline Phosphatase: 67 [IU]/L (ref 49–135)
BUN/Creatinine Ratio: 15 (ref 12–28)
BUN: 13 mg/dL (ref 8–27)
Bilirubin Total: 0.4 mg/dL (ref 0.0–1.2)
CO2: 25 mmol/L (ref 20–29)
Calcium: 9.7 mg/dL (ref 8.7–10.3)
Chloride: 102 mmol/L (ref 96–106)
Creatinine, Ser: 0.89 mg/dL (ref 0.57–1.00)
Globulin, Total: 2.7 g/dL (ref 1.5–4.5)
Glucose: 81 mg/dL (ref 70–99)
Potassium: 3.6 mmol/L (ref 3.5–5.2)
Sodium: 143 mmol/L (ref 134–144)
Total Protein: 6.9 g/dL (ref 6.0–8.5)
eGFR: 73 mL/min/{1.73_m2}

## 2024-07-03 LAB — MAGNESIUM: Magnesium: 2 mg/dL (ref 1.6–2.3)

## 2024-07-03 LAB — T4, FREE: Free T4: 1.45 ng/dL (ref 0.82–1.77)

## 2024-07-03 LAB — FOLATE: Folate: 9.8 ng/mL

## 2024-07-03 LAB — LIPID PANEL
Chol/HDL Ratio: 3 ratio (ref 0.0–4.4)
Cholesterol, Total: 178 mg/dL (ref 100–199)
HDL: 60 mg/dL
LDL Chol Calc (NIH): 103 mg/dL — ABNORMAL HIGH (ref 0–99)
Triglycerides: 83 mg/dL (ref 0–149)
VLDL Cholesterol Cal: 15 mg/dL (ref 5–40)

## 2024-07-03 LAB — INSULIN, RANDOM: INSULIN: 22.1 u[IU]/mL (ref 2.6–24.9)

## 2024-07-03 LAB — VITAMIN D 25 HYDROXY (VIT D DEFICIENCY, FRACTURES): Vit D, 25-Hydroxy: 8.1 ng/mL — ABNORMAL LOW (ref 30.0–100.0)

## 2024-07-03 LAB — T3: T3, Total: 163 ng/dL (ref 71–180)

## 2024-07-03 LAB — HEMOGLOBIN A1C
Est. average glucose Bld gHb Est-mCnc: 108 mg/dL
Hgb A1c MFr Bld: 5.4 % (ref 4.8–5.6)

## 2024-07-03 LAB — VITAMIN B12: Vitamin B-12: 346 pg/mL (ref 232–1245)

## 2024-07-16 ENCOUNTER — Ambulatory Visit (INDEPENDENT_AMBULATORY_CARE_PROVIDER_SITE_OTHER): Admitting: Family Medicine

## 2024-07-22 ENCOUNTER — Ambulatory Visit: Admitting: Family Medicine

## 2024-09-22 ENCOUNTER — Ambulatory Visit: Admitting: Dermatology
